# Patient Record
Sex: Female | Born: 1970 | Hispanic: No | Marital: Married | Smoking: Never smoker
Health system: Southern US, Community
[De-identification: ages and names within clinical notes are randomized; demographics above are authoritative.]

## PROBLEM LIST (undated history)

## (undated) ENCOUNTER — Inpatient Hospital Stay (HOSPITAL_COMMUNITY): Payer: Self-pay

## (undated) DIAGNOSIS — E1165 Type 2 diabetes mellitus with hyperglycemia: Secondary | ICD-10-CM

## (undated) DIAGNOSIS — O24419 Gestational diabetes mellitus in pregnancy, unspecified control: Secondary | ICD-10-CM

## (undated) DIAGNOSIS — E669 Obesity, unspecified: Secondary | ICD-10-CM

## (undated) HISTORY — DX: Type 2 diabetes mellitus with hyperglycemia: E11.65

---

## 2007-06-19 ENCOUNTER — Ambulatory Visit (HOSPITAL_COMMUNITY): Admission: RE | Admit: 2007-06-19 | Discharge: 2007-06-19 | Payer: Self-pay | Admitting: Family Medicine

## 2007-11-05 ENCOUNTER — Ambulatory Visit: Payer: Self-pay | Admitting: Family Medicine

## 2007-11-05 ENCOUNTER — Inpatient Hospital Stay (HOSPITAL_COMMUNITY): Admission: AD | Admit: 2007-11-05 | Discharge: 2007-11-06 | Payer: Self-pay | Admitting: Family Medicine

## 2007-11-21 ENCOUNTER — Inpatient Hospital Stay (HOSPITAL_COMMUNITY): Admission: AD | Admit: 2007-11-21 | Discharge: 2007-11-25 | Payer: Self-pay | Admitting: Family Medicine

## 2007-11-21 ENCOUNTER — Ambulatory Visit: Payer: Self-pay | Admitting: Physician Assistant

## 2010-01-27 ENCOUNTER — Ambulatory Visit (HOSPITAL_COMMUNITY): Admission: RE | Admit: 2010-01-27 | Discharge: 2010-01-27 | Payer: Self-pay | Admitting: Family Medicine

## 2010-02-07 ENCOUNTER — Encounter
Admission: RE | Admit: 2010-02-07 | Discharge: 2010-05-03 | Payer: Self-pay | Source: Home / Self Care | Attending: Obstetrics & Gynecology | Admitting: Obstetrics & Gynecology

## 2010-02-07 ENCOUNTER — Ambulatory Visit: Payer: Self-pay | Admitting: Obstetrics & Gynecology

## 2010-02-08 ENCOUNTER — Encounter: Payer: Self-pay | Admitting: Family Medicine

## 2010-02-08 ENCOUNTER — Ambulatory Visit (HOSPITAL_COMMUNITY)
Admission: RE | Admit: 2010-02-08 | Discharge: 2010-02-08 | Payer: Self-pay | Source: Home / Self Care | Admitting: Family Medicine

## 2010-02-14 ENCOUNTER — Ambulatory Visit: Payer: Self-pay | Admitting: Obstetrics and Gynecology

## 2010-02-14 ENCOUNTER — Encounter: Payer: Self-pay | Admitting: Physician Assistant

## 2010-02-21 ENCOUNTER — Ambulatory Visit: Payer: Self-pay | Admitting: Obstetrics & Gynecology

## 2010-03-07 ENCOUNTER — Ambulatory Visit: Payer: Self-pay | Admitting: Obstetrics & Gynecology

## 2010-03-07 LAB — CONVERTED CEMR LAB
HCT: 37.1 % (ref 36.0–46.0)
Hemoglobin: 12 g/dL (ref 12.0–15.0)
MCHC: 32.3 g/dL (ref 30.0–36.0)
MCV: 90 fL (ref 78.0–100.0)
Platelets: 270 10*3/uL (ref 150–400)
RBC: 4.12 M/uL (ref 3.87–5.11)
RDW: 14.6 % (ref 11.5–15.5)
WBC: 10.8 10*3/uL — ABNORMAL HIGH (ref 4.0–10.5)

## 2010-03-21 ENCOUNTER — Ambulatory Visit: Payer: Self-pay | Admitting: Obstetrics & Gynecology

## 2010-04-04 ENCOUNTER — Ambulatory Visit: Payer: Self-pay | Admitting: Obstetrics & Gynecology

## 2010-04-05 ENCOUNTER — Encounter: Payer: Self-pay | Admitting: Obstetrics & Gynecology

## 2010-04-11 ENCOUNTER — Ambulatory Visit
Admission: RE | Admit: 2010-04-11 | Discharge: 2010-04-11 | Payer: Self-pay | Source: Home / Self Care | Attending: Obstetrics and Gynecology | Admitting: Obstetrics and Gynecology

## 2010-04-12 ENCOUNTER — Encounter (INDEPENDENT_AMBULATORY_CARE_PROVIDER_SITE_OTHER): Payer: Self-pay | Admitting: *Deleted

## 2010-04-14 ENCOUNTER — Ambulatory Visit: Admit: 2010-04-14 | Payer: Self-pay | Admitting: Obstetrics and Gynecology

## 2010-04-15 ENCOUNTER — Ambulatory Visit
Admission: RE | Admit: 2010-04-15 | Discharge: 2010-04-15 | Payer: Self-pay | Source: Home / Self Care | Attending: Obstetrics & Gynecology | Admitting: Obstetrics & Gynecology

## 2010-04-18 ENCOUNTER — Ambulatory Visit
Admission: RE | Admit: 2010-04-18 | Discharge: 2010-04-18 | Payer: Self-pay | Source: Home / Self Care | Attending: Obstetrics and Gynecology | Admitting: Obstetrics and Gynecology

## 2010-04-18 ENCOUNTER — Encounter: Payer: Self-pay | Admitting: Obstetrics & Gynecology

## 2010-04-18 LAB — POCT URINALYSIS DIPSTICK
Bilirubin Urine: NEGATIVE
Ketones, ur: 15 mg/dL — AB
Nitrite: NEGATIVE
Protein, ur: NEGATIVE mg/dL
Specific Gravity, Urine: 1.02 (ref 1.005–1.030)
Urine Glucose, Fasting: NEGATIVE mg/dL
Urobilinogen, UA: 0.2 mg/dL (ref 0.0–1.0)
pH: 7 (ref 5.0–8.0)

## 2010-04-20 LAB — POCT URINALYSIS DIPSTICK
Bilirubin Urine: NEGATIVE
Hgb urine dipstick: NEGATIVE
Ketones, ur: NEGATIVE mg/dL
Nitrite: NEGATIVE
Protein, ur: 30 mg/dL — AB
Specific Gravity, Urine: 1.025 (ref 1.005–1.030)
Urine Glucose, Fasting: NEGATIVE mg/dL
Urobilinogen, UA: 0.2 mg/dL (ref 0.0–1.0)
pH: 7 (ref 5.0–8.0)

## 2010-04-21 ENCOUNTER — Ambulatory Visit: Admit: 2010-04-21 | Payer: Self-pay | Admitting: Obstetrics and Gynecology

## 2010-04-21 ENCOUNTER — Ambulatory Visit (HOSPITAL_COMMUNITY)
Admission: RE | Admit: 2010-04-21 | Discharge: 2010-04-21 | Payer: Self-pay | Source: Home / Self Care | Attending: Obstetrics & Gynecology | Admitting: Obstetrics & Gynecology

## 2010-04-22 ENCOUNTER — Ambulatory Visit
Admission: RE | Admit: 2010-04-22 | Discharge: 2010-04-22 | Payer: Self-pay | Source: Home / Self Care | Attending: Obstetrics & Gynecology | Admitting: Obstetrics & Gynecology

## 2010-04-25 ENCOUNTER — Ambulatory Visit
Admission: RE | Admit: 2010-04-25 | Discharge: 2010-04-25 | Payer: Self-pay | Source: Home / Self Care | Attending: Obstetrics & Gynecology | Admitting: Obstetrics & Gynecology

## 2010-04-26 LAB — POCT URINALYSIS DIPSTICK
Bilirubin Urine: NEGATIVE
Hgb urine dipstick: NEGATIVE
Ketones, ur: NEGATIVE mg/dL
Nitrite: NEGATIVE
Protein, ur: 30 mg/dL — AB
Specific Gravity, Urine: 1.02 (ref 1.005–1.030)
Urine Glucose, Fasting: NEGATIVE mg/dL
Urobilinogen, UA: 0.2 mg/dL (ref 0.0–1.0)
pH: 8 (ref 5.0–8.0)

## 2010-04-29 ENCOUNTER — Ambulatory Visit
Admission: RE | Admit: 2010-04-29 | Discharge: 2010-04-29 | Payer: Self-pay | Source: Home / Self Care | Attending: Obstetrics and Gynecology | Admitting: Obstetrics and Gynecology

## 2010-05-02 ENCOUNTER — Ambulatory Visit
Admission: RE | Admit: 2010-05-02 | Discharge: 2010-05-02 | Payer: Self-pay | Source: Home / Self Care | Attending: Obstetrics and Gynecology | Admitting: Obstetrics and Gynecology

## 2010-05-02 ENCOUNTER — Encounter (INDEPENDENT_AMBULATORY_CARE_PROVIDER_SITE_OTHER): Payer: Self-pay | Admitting: *Deleted

## 2010-05-02 LAB — POCT URINALYSIS DIPSTICK
Bilirubin Urine: NEGATIVE
Hgb urine dipstick: NEGATIVE
Ketones, ur: NEGATIVE mg/dL
Nitrite: NEGATIVE
Protein, ur: NEGATIVE mg/dL
Specific Gravity, Urine: 1.02 (ref 1.005–1.030)
Urine Glucose, Fasting: NEGATIVE mg/dL
Urobilinogen, UA: 0.2 mg/dL (ref 0.0–1.0)
pH: 7 (ref 5.0–8.0)

## 2010-05-02 LAB — CONVERTED CEMR LAB
Chlamydia, DNA Probe: NEGATIVE
GC Probe Amp, Genital: NEGATIVE

## 2010-05-03 ENCOUNTER — Encounter (INDEPENDENT_AMBULATORY_CARE_PROVIDER_SITE_OTHER): Payer: Self-pay | Admitting: *Deleted

## 2010-05-05 ENCOUNTER — Other Ambulatory Visit: Payer: Medicaid Other

## 2010-05-05 DIAGNOSIS — O99810 Abnormal glucose complicating pregnancy: Secondary | ICD-10-CM

## 2010-05-05 DIAGNOSIS — O09529 Supervision of elderly multigravida, unspecified trimester: Secondary | ICD-10-CM

## 2010-05-05 DIAGNOSIS — O409XX0 Polyhydramnios, unspecified trimester, not applicable or unspecified: Secondary | ICD-10-CM

## 2010-05-09 ENCOUNTER — Other Ambulatory Visit: Payer: Self-pay | Admitting: Family Medicine

## 2010-05-09 ENCOUNTER — Other Ambulatory Visit: Payer: Self-pay

## 2010-05-09 ENCOUNTER — Other Ambulatory Visit: Payer: Medicaid Other

## 2010-05-09 DIAGNOSIS — O99810 Abnormal glucose complicating pregnancy: Secondary | ICD-10-CM

## 2010-05-09 DIAGNOSIS — O24419 Gestational diabetes mellitus in pregnancy, unspecified control: Secondary | ICD-10-CM

## 2010-05-09 DIAGNOSIS — O409XX0 Polyhydramnios, unspecified trimester, not applicable or unspecified: Secondary | ICD-10-CM

## 2010-05-09 DIAGNOSIS — O09529 Supervision of elderly multigravida, unspecified trimester: Secondary | ICD-10-CM

## 2010-05-09 LAB — POCT URINALYSIS DIPSTICK
Bilirubin Urine: NEGATIVE
Nitrite: NEGATIVE
Protein, ur: NEGATIVE mg/dL
Specific Gravity, Urine: 1.02 (ref 1.005–1.030)
Urine Glucose, Fasting: NEGATIVE mg/dL
Urobilinogen, UA: 0.2 mg/dL (ref 0.0–1.0)
pH: 6.5 (ref 5.0–8.0)

## 2010-05-10 ENCOUNTER — Encounter: Payer: Self-pay | Admitting: Obstetrics & Gynecology

## 2010-05-13 ENCOUNTER — Other Ambulatory Visit: Payer: Medicaid Other

## 2010-05-13 DIAGNOSIS — O99810 Abnormal glucose complicating pregnancy: Secondary | ICD-10-CM

## 2010-05-13 DIAGNOSIS — O409XX0 Polyhydramnios, unspecified trimester, not applicable or unspecified: Secondary | ICD-10-CM

## 2010-05-13 DIAGNOSIS — O09529 Supervision of elderly multigravida, unspecified trimester: Secondary | ICD-10-CM

## 2010-05-16 ENCOUNTER — Other Ambulatory Visit: Payer: Self-pay

## 2010-05-16 ENCOUNTER — Other Ambulatory Visit: Payer: Medicaid Other

## 2010-05-16 DIAGNOSIS — O09529 Supervision of elderly multigravida, unspecified trimester: Secondary | ICD-10-CM

## 2010-05-16 DIAGNOSIS — O99810 Abnormal glucose complicating pregnancy: Secondary | ICD-10-CM

## 2010-05-16 DIAGNOSIS — O409XX0 Polyhydramnios, unspecified trimester, not applicable or unspecified: Secondary | ICD-10-CM

## 2010-05-16 LAB — POCT URINALYSIS DIPSTICK
Bilirubin Urine: NEGATIVE
Hgb urine dipstick: NEGATIVE
Nitrite: NEGATIVE
Protein, ur: NEGATIVE mg/dL
Specific Gravity, Urine: 1.02 (ref 1.005–1.030)
Urine Glucose, Fasting: NEGATIVE mg/dL
Urobilinogen, UA: 0.2 mg/dL (ref 0.0–1.0)
pH: 6.5 (ref 5.0–8.0)

## 2010-05-20 ENCOUNTER — Other Ambulatory Visit: Payer: Medicaid Other

## 2010-05-20 DIAGNOSIS — O409XX0 Polyhydramnios, unspecified trimester, not applicable or unspecified: Secondary | ICD-10-CM

## 2010-05-20 DIAGNOSIS — O99810 Abnormal glucose complicating pregnancy: Secondary | ICD-10-CM

## 2010-05-23 ENCOUNTER — Encounter (HOSPITAL_COMMUNITY): Payer: Self-pay

## 2010-05-23 ENCOUNTER — Ambulatory Visit (HOSPITAL_COMMUNITY)
Admission: RE | Admit: 2010-05-23 | Discharge: 2010-05-23 | Disposition: A | Discharge status: Home / Self Care | Payer: Medicaid Other | Source: Ambulatory Visit | Attending: Family Medicine | Admitting: Family Medicine

## 2010-05-23 ENCOUNTER — Other Ambulatory Visit: Payer: Medicaid Other

## 2010-05-23 ENCOUNTER — Other Ambulatory Visit: Payer: Self-pay

## 2010-05-23 ENCOUNTER — Encounter: Payer: Medicaid Other | Attending: Obstetrics & Gynecology | Admitting: Dietician

## 2010-05-23 ENCOUNTER — Encounter (INDEPENDENT_AMBULATORY_CARE_PROVIDER_SITE_OTHER): Payer: Self-pay | Admitting: *Deleted

## 2010-05-23 DIAGNOSIS — O09529 Supervision of elderly multigravida, unspecified trimester: Secondary | ICD-10-CM | POA: Insufficient documentation

## 2010-05-23 DIAGNOSIS — E669 Obesity, unspecified: Secondary | ICD-10-CM | POA: Insufficient documentation

## 2010-05-23 DIAGNOSIS — O99210 Obesity complicating pregnancy, unspecified trimester: Secondary | ICD-10-CM | POA: Insufficient documentation

## 2010-05-23 DIAGNOSIS — O24419 Gestational diabetes mellitus in pregnancy, unspecified control: Secondary | ICD-10-CM

## 2010-05-23 DIAGNOSIS — O30009 Twin pregnancy, unspecified number of placenta and unspecified number of amniotic sacs, unspecified trimester: Secondary | ICD-10-CM

## 2010-05-23 DIAGNOSIS — O99810 Abnormal glucose complicating pregnancy: Secondary | ICD-10-CM

## 2010-05-23 DIAGNOSIS — O0930 Supervision of pregnancy with insufficient antenatal care, unspecified trimester: Secondary | ICD-10-CM

## 2010-05-23 LAB — POCT URINALYSIS DIPSTICK
Bilirubin Urine: NEGATIVE
Ketones, ur: NEGATIVE mg/dL
Nitrite: NEGATIVE
Protein, ur: NEGATIVE mg/dL
Specific Gravity, Urine: 1.02 (ref 1.005–1.030)
Urine Glucose, Fasting: NEGATIVE mg/dL
Urobilinogen, UA: 0.2 mg/dL (ref 0.0–1.0)
pH: 5.5 (ref 5.0–8.0)

## 2010-05-25 ENCOUNTER — Inpatient Hospital Stay (HOSPITAL_COMMUNITY): Payer: Medicaid Other

## 2010-05-25 ENCOUNTER — Inpatient Hospital Stay (HOSPITAL_COMMUNITY)
Admission: RE | Admit: 2010-05-25 | Discharge: 2010-05-28 | DRG: 765 | Disposition: A | Discharge status: Home / Self Care | Payer: Medicaid Other | Source: Ambulatory Visit | Attending: Obstetrics & Gynecology | Admitting: Obstetrics & Gynecology

## 2010-05-25 DIAGNOSIS — O409XX0 Polyhydramnios, unspecified trimester, not applicable or unspecified: Secondary | ICD-10-CM | POA: Diagnosis present

## 2010-05-25 DIAGNOSIS — O3660X0 Maternal care for excessive fetal growth, unspecified trimester, not applicable or unspecified: Secondary | ICD-10-CM

## 2010-05-25 DIAGNOSIS — O329XX0 Maternal care for malpresentation of fetus, unspecified, not applicable or unspecified: Secondary | ICD-10-CM | POA: Diagnosis present

## 2010-05-25 DIAGNOSIS — E669 Obesity, unspecified: Secondary | ICD-10-CM | POA: Diagnosis present

## 2010-05-25 DIAGNOSIS — O99892 Other specified diseases and conditions complicating childbirth: Secondary | ICD-10-CM | POA: Diagnosis present

## 2010-05-25 DIAGNOSIS — O99214 Obesity complicating childbirth: Secondary | ICD-10-CM | POA: Diagnosis present

## 2010-05-25 DIAGNOSIS — O99814 Abnormal glucose complicating childbirth: Secondary | ICD-10-CM

## 2010-05-25 DIAGNOSIS — Z22330 Carrier of Group B streptococcus: Secondary | ICD-10-CM

## 2010-05-25 DIAGNOSIS — O09529 Supervision of elderly multigravida, unspecified trimester: Secondary | ICD-10-CM | POA: Diagnosis present

## 2010-05-25 LAB — CBC
HCT: 38.1 % (ref 36.0–46.0)
Hemoglobin: 12.4 g/dL (ref 12.0–15.0)
MCH: 28.5 pg (ref 26.0–34.0)
MCHC: 32.5 g/dL (ref 30.0–36.0)
MCV: 87.6 fL (ref 78.0–100.0)
Platelets: 274 10*3/uL (ref 150–400)
RBC: 4.35 MIL/uL (ref 3.87–5.11)
RDW: 15 % (ref 11.5–15.5)
WBC: 9.4 10*3/uL (ref 4.0–10.5)

## 2010-05-25 LAB — GLUCOSE, CAPILLARY: Glucose-Capillary: 91 mg/dL (ref 70–99)

## 2010-05-25 LAB — GLUCOSE, RANDOM: Glucose, Bld: 80 mg/dL (ref 70–99)

## 2010-05-25 LAB — MRSA PCR SCREENING: MRSA by PCR: NEGATIVE

## 2010-05-25 LAB — RPR: RPR Ser Ql: NONREACTIVE

## 2010-05-26 LAB — CBC
HCT: 33.2 % — ABNORMAL LOW (ref 36.0–46.0)
Hemoglobin: 10.5 g/dL — ABNORMAL LOW (ref 12.0–15.0)
MCH: 27.8 pg (ref 26.0–34.0)
MCHC: 31.6 g/dL (ref 30.0–36.0)
MCV: 87.8 fL (ref 78.0–100.0)
Platelets: 229 10*3/uL (ref 150–400)
RBC: 3.78 MIL/uL — ABNORMAL LOW (ref 3.87–5.11)
RDW: 15.1 % (ref 11.5–15.5)
WBC: 8.8 10*3/uL (ref 4.0–10.5)

## 2010-05-26 LAB — GLUCOSE, CAPILLARY
Glucose-Capillary: 114 mg/dL — ABNORMAL HIGH (ref 70–99)
Glucose-Capillary: 127 mg/dL — ABNORMAL HIGH (ref 70–99)
Glucose-Capillary: 102 mg/dL — ABNORMAL HIGH (ref 70–99)

## 2010-05-26 NOTE — Op Note (Addendum)
Norma Chambers, Norma Chambers                ACCOUNT NO.:  [id number]  MEDICAL RECORD NO.:  [id number]           PATIENT TYPE:  I  LOCATION:  9109                          [facility]  PHYSICIAN:  Scheryl Darter, MD       DATE OF BIRTH:  10-Jul-1970  DATE OF PROCEDURE:  05/25/2010 DATE OF DISCHARGE:                              OPERATIVE REPORT   PREOPERATIVE DIAGNOSES: 1. Intrauterine pregnancy at 39 and 0 weeks. 2. Fetal macrosomia. 3. A2 gestational diabetes. 4. Fetal malpresentation.  POSTOPERATIVE DIAGNOSIS: 1. Intrauterine pregnancy at 39 and 0 weeks. 2. Fetal macrosomia. 3. A2 gestational diabetes. 4. Fetal malpresentation.  PROCEDURE:  Primary low-transverse cesarean section via Pfannenstiel incision.  SURGEONS:  Scheryl Darter, MD and Maryelizabeth Kaufmann, MD  ANESTHESIA:  Spinal.  IV FLUIDS:  2700 mL  URINE OUTPUT:  200 mL of clear urine at the end of the procedure.  ESTIMATED BLOOD LOSS:  800 mL.  FINDINGS:  A viable female infant in vertex presentation with clear amniotic fluid.  Apgars were 8 and 9, weight was 11 pounds 4 ounces. Normal uterus, tubes, and ovaries.  SPECIMENS:  Placenta was sent to Labor and Delivery.  COMPLICATIONS:  None immediate.  INDICATIONS:  This is a 40 year old gravida 2, para 1-0-0-1 with intrauterine pregnancy at 39 weeks and 0 days with A2 gestational diabetes, history of previous vacuum-assisted vaginal delivery of a 9 pounds 5 ounces baby that spent time in the NICU.  However, the prenatal history was notable for advanced maternal age, late care, polyhydramnios, and morbid obesity.  The patient presented on day of admission for an induction of labor for gestational diabetes and polyhydramnios.  She was otherwise asymptomatic except for some nasal congestion and non laboring. Her medications included glyburide 2.5 mg p.o. at bedtime. The remainder of her past medical history was unremarkable.  She did have an ultrasound on the May 23, 2010, and had an estimated fetal weight of 12 pounds 13 ounces and then she had another ultrasound today with transverse presentation with the shoulder.  Her digital exam was deferred.   The patient was counseled regarding fetal macrosomia in transverse presentation, and the patient was consented for primary cesarean section due to fetal macrosomia and transverse presentation. Risks, benefits, and alternatives were discussed with the patient, and it was decided that the patient would proceed back with cesarean section for the aforementioned reasons.  PROCEDURE IN DETAIL:  After informed consent was obtained, the patient was taken back to the operative suite where spinal anesthesia was placed.  After anesthesia was found to be adequate, the patient was placed in dorsal supine position with a leftward tilt.  The patient was prepped and draped in normal sterile fashion.  Again, anesthesia was tested, was found to be adequate.  A Pfannenstiel skin incision was then made with the scalpel and carried down through to underlying fascia. The fascia was then incised in the midline.  The fascial incision was then extended laterally with Mayo scissors.  The superior aspect of the fascial incision was then grasped with Kocher clamps then elevated and tented up, and  the rectus muscles was then dissected off bluntly. Attention was then turned inferiorly where the inferior aspect of the fascial incision and then grasped with Kocher clamps, elevated, tented up, and the rectus muscles were then dissected off bluntly, and the rectus muscles was then separated in the midline.  Peritoneum was then entered bluntly, and the Alexis O ring retractor was then inserted into the abdomen.  The vesicouterine peritoneum was then identified, and it was incised with a Metzenbaum scissors and a bladder flap was then created digitally.  The lower uterine segment was then incised in a transverse fashion with a scalpel.   The incision was then extended manually.  Amniotomy was performed with copious clear fluid due to polyhydramnios and infant was found to be now in vertex presentation, and was delivered otherwise atraumatically.  Mouth and nose were bulb suctioned. Cord was cut and clamped and was sent off to awaiting NICU.  Apgars were 8 and 9, weight was 11 pounds 4 ounces.  Cord blood was sampled. Placenta was then delivered spontaneously intact with a three-vessel cord.  The uterus was then cleared of all clots and debris.  The incision was then repaired with a two-layer closure with an 0 Vicryl initially running and locking, and then the second layer was then imbricated.  Hemostasis was found to be adequate.  Bilateral adnexa were then visualized and found to be normal.  The Alexis O ring retractor was then removed.  Peritoneum was then cleared of all clots and debris.  The peritoneum was then closed with 2-0 Vicryl in a running fashion.  The fascial incision was then repaired with an 0 Vicryl in a running fashion.  Subcutaneous tissue was then irrigated, found to be hemostatic.  Additional hemostasis was then obtained with Bovie, and the skin was then closed with staples.  The patient did receive antibiotics perioperatively.  Lap, needle, and sponge counts were correct x2.  The patient was taken back to the recovery area in stable condition.  There were no complications.    ______________________________ Maryelizabeth Kaufmann, MD   ______________________________ Scheryl Darter, MD    LC/MEDQ  D:  05/25/2010  T:  05/26/2010  Job:  [id number]  Electronically Signed by Maryelizabeth Kaufmann MD on 05/26/2010 09:38:13 AM Electronically Signed by Scheryl Darter MD on 05/27/2010 10:43:58 AM

## 2010-05-27 LAB — GLUCOSE, CAPILLARY: Glucose-Capillary: 104 mg/dL — ABNORMAL HIGH (ref 70–99)

## 2010-06-06 NOTE — Discharge Summary (Addendum)
Norma Chambers, Norma Chambers                ACCOUNT NO.:  [id number]  MEDICAL RECORD NO.:  [id number]           PATIENT TYPE:  I  LOCATION:  9110                          [facility]  PHYSICIAN:  Lesly Dukes, M.D. DATE OF BIRTH:  20-Aug-1970  DATE OF ADMISSION:  05/25/2010 DATE OF DISCHARGE:  05/27/2010                              DISCHARGE SUMMARY   ADMISSION DIAGNOSES: 1. Intrauterine pregnancy at 39 weeks and 0 days. 2. A2 gestational diabetes. 3. Fetal macrosomia. 4. Transverse presentation.  DISCHARGE DIAGNOSES:  Status post primary low transverse cesarean section for fetal macrosomia and transverse presentation.  DISCHARGE MEDICATIONS: 1. Ibuprofen 600 mg one tablet p.o. q.6 h. p.r.n. 2. Percocet 5/325 one tablet p.o. q.4 h. p.r.n. 3. Colace 100 mg one tablet p.o. b.i.d. 4. Claritin 10 mg one tablet p.o. daily. 5. Mucinex 600 mg one tablet p.o. q.12 h. as needed.  PERTINENT LABORATORY DATA:  Postoperative CBC with a hemoglobin of 10.5, hematocrit of 33.2, white count was 8.8, and platelets were 229, CBG during this admission ranged from 104 to maximum of 127.  She was not continued on her home glyburide postoperatively.  OPERATIONS:  She had a primary low transverse cesarean section on May 25, 2010, by Dr. Debroah Loop and Dr. Orvan Falconer with a delivery of a viable female in vertex presentation at that time, clear amniotic fluid. Apgars were 8 and 9, weight was 11 pounds and 4 ounces.  No complications.  Normal uterus, tubes, and ovaries.  HOSPITAL COURSE:  This is a 40 year old gravida 2, para 1 who was in on the day of admission with a history of intrauterine pregnancy at 39 weeks and 0 days with prenatal history complicated by A2 gestational diabetes, advanced maternal age, polyhydramnios, morbid obesity, and late care.  The patient was admitted for induction.  Her estimated fetal weight was 12 pounds 13 ounces on ultrasound 2 days previously and ultrasound on the day  of admission, had transverse presentation with the shoulder.  Decision was made at that time to proceed with a primary low transverse cesarean section for fetal macrosomia.  Result of this surgery are as previously mentioned.  Postoperative course was otherwise benign.  She was doing well.  She is asymptomatic, ambulating, hemodynamically stable.  She was discharged home on postoperative day 2 in stable condition with no complaints.  DISPOSITION:  Discharged to home.  DISCHARGE CONDITION:  Stable.  FOLLOWUP:  The patient is to follow up in [facility] in 4-5 weeks for postoperative check.  She will have her staples to be removed by the [facility] nurse on postoperative day 5-7.  The patient will also [facility] for IUD placement.  EMERGENCY ROOM WARNINGS:  The patient is to return to the emergency department with any fevers, chills, nausea, vomiting, any problems with her incision such as redness, swelling, discharge, pain, bleeding, opening, or any other problems.    ______________________________ Maryelizabeth Kaufmann, MD   ______________________________ Lesly Dukes, M.D.    LC/MEDQ  D:  05/27/2010  T:  05/27/2010  Job:  [id number]  Electronically  Signed by Maryelizabeth Kaufmann MD on 06/06/2010 09:48:16 AM Electronically Signed by Elsie Lincoln M.D. on 07/15/2010 11:18:50 AM

## 2010-06-13 LAB — POCT URINALYSIS DIPSTICK
Bilirubin Urine: NEGATIVE
Glucose, UA: NEGATIVE mg/dL
Hgb urine dipstick: NEGATIVE
Hgb urine dipstick: NEGATIVE
Ketones, ur: 15 mg/dL — AB
Protein, ur: NEGATIVE mg/dL
Specific Gravity, Urine: 1.025 (ref 1.005–1.030)
Urobilinogen, UA: 0.2 mg/dL (ref 0.0–1.0)
pH: 6.5 (ref 5.0–8.0)
pH: 7 (ref 5.0–8.0)

## 2010-06-14 LAB — POCT URINALYSIS DIPSTICK
Hgb urine dipstick: NEGATIVE
Hgb urine dipstick: NEGATIVE
Ketones, ur: NEGATIVE mg/dL
Ketones, ur: NEGATIVE mg/dL
Nitrite: NEGATIVE
Nitrite: NEGATIVE
Protein, ur: 30 mg/dL — AB
Protein, ur: NEGATIVE mg/dL
Protein, ur: NEGATIVE mg/dL
Protein, ur: NEGATIVE mg/dL
Specific Gravity, Urine: 1.025 (ref 1.005–1.030)
Specific Gravity, Urine: 1.025 (ref 1.005–1.030)
Urobilinogen, UA: 0.2 mg/dL (ref 0.0–1.0)
Urobilinogen, UA: 0.2 mg/dL (ref 0.0–1.0)
pH: 6.5 (ref 5.0–8.0)
pH: 7 (ref 5.0–8.0)
pH: 7 (ref 5.0–8.0)
pH: 7 (ref 5.0–8.0)

## 2010-06-20 ENCOUNTER — Ambulatory Visit: Payer: Medicaid Other | Admitting: Obstetrics & Gynecology

## 2010-06-23 ENCOUNTER — Ambulatory Visit (INDEPENDENT_AMBULATORY_CARE_PROVIDER_SITE_OTHER): Payer: Medicaid Other | Admitting: Obstetrics & Gynecology

## 2010-06-23 DIAGNOSIS — Z09 Encounter for follow-up examination after completed treatment for conditions other than malignant neoplasm: Secondary | ICD-10-CM

## 2010-06-24 NOTE — Progress Notes (Signed)
NAMEJERICHO, Norma Chambers                ACCOUNT NO.:  [id number]  MEDICAL RECORD NO.:  [id number]           PATIENT TYPE:  A  LOCATION:  [facility]                   [facility]  PHYSICIAN:  Jaynie Collins, MD     DATE OF BIRTH:  1970/06/04  DATE OF SERVICE:  06/23/2010                                 CLINIC NOTE  This patient is a 40 year old who is returning for her 4-week postop check status post C-section delivery for a 11-pound 4-ounce baby or 5105 g.  The patient's pregnancy was complicated by GDM, she was on glyburide.  She also has polyhydramnios and was rubella nonimmune.  She was presenting to care, late at 22 weeks.  The patient presents today without any complaint of pain.  She states that her incisions are healing well.  She has not had any abdominal pain, trouble with bowel movement.  She denies any lochia.  She has not had sex since her delivery.  This patient reports that she is breastfeeding, and on prenatal vitamins today.  For contraception, the patient decided the ParaGard.  We discussed that she would need [facility] for [facility] placement.  REVIEW OF SYSTEMS:  The patient denies headaches, chest pain, abdominal pain, changes in her bowel movement.  PHYSICAL EXAMINATION:  VITAL SIGNS:  Temperature 98.2, pulse 84, blood pressure 114/80, weight 216.8, height 64 inches. GENERAL:  She is in no acute distress.  Vital signs were reviewed. ABDOMEN:  Soft, nondistended, nontender.  C-section well healed. GYNECOLOGIC:  External genitalia is normal without lesions.  Vagina is normal with normal rugation and color without discharge.  Cervix is normal without friability, cervical motion tenderness, or discharge. Uterus is slightly below the umbilicus and ovaries are not palpated due to body habitus.  ALLERGIES:  The patient has no known drug allergies.  ASSESSMENT AND PLAN:  This is a 40 year old 4-week  postop from a C- section.  PLAN: 1. The  patient needs to go to health department for ParaGard     insertion. 2. The patient is to return to clinic in 2 weeks for 6-week postpartum     check and for a 2-hour glucose tolerance test as she has had GDM.    ______________________________ Ellery Plunk, MD   ______________________________ Jaynie Collins, MD  RS/MEDQ  D:  06/23/2010  T:  06/24/2010  Job:  [id number]

## 2010-08-19 NOTE — Discharge Summary (Signed)
[name], Chambers                ACCOUNT NO.:  [id number]   MEDICAL RECORD NO.:  [id number]          PATIENT TYPE:  INP   LOCATION:  9125                          [facility]   PHYSICIAN:  Ginger Carne, MD  DATE OF BIRTH:  March 27, 1971   DATE OF ADMISSION:  11/21/2007  DATE OF DISCHARGE:  11/25/2007                               DISCHARGE SUMMARY   REASON FOR ADMISSION:  Onset of labor.   PROCEDURE:  1. Prenatal: nonstress test, ultrasound.  2. Intrapartum: vacuum-assisted vaginal delivery.   OPERATIVE POSTPARTUM:  None.   DISCHARGE DIAGNOSIS:  Term pregnancy delivered.   DISCHARGE DATE:  November 25, 2007.   ACTIVITY:  Unrestricted.   DIET:  Routine.   MEDICATIONS:  1. Prenatal vitamins p.o. 1 tablet daily while nursing.  2. Colace 100 mg p.o. once daily as needed for postpartum      constipation.  3. Ibuprofen 600 mg p.o. 1-2 tablet q.4 hour as needed for postpartum      pain.  4. Micronor progesterone birth control p.o. 1 daily at the same time      each day.   STATUS:  Well.   INSTRUCTIONS:  Pelvic rest x6 weeks.  Discharge to home.  Followup in 6  weeks at the [facility] Department.   LABS:  November 21, 2007:  Hematocrit 37.2, hemoglobin 12.4   NEWBORN DATA:  The patient delivered a viable female with a weight of 9  pounds 5 ounces.  Newborn was discharged home to mother.   BRIEF HOSPITAL COURSE:  This is a 40 year old G1 who presents at 106.4  weeks' gestational age with spontaneous rupture of membrane at home.  At  admission, the patient had positive fern test, positive Nitrazine test,  and positive pooling.  The patient was admitted to L&D and after  prolonged labor, the patient delivered viable female via vacuum-assisted  vaginal delivery with Apgars of 4, 6, and 9.  Baby and mother did well  during hospitalization.  Patient hemodynamically stabled.  The patient  is breastfeeding.  Desires birth control pills for  contraception.  The  patient discharged home with prenatal vitamin,  Micronor, Colace, and ibuprofen.  The patient is to follow up in 6 weeks  at the [facility] Department for postpartum check.  The  patient with GBS negative.  Blood type 0 positive.  Antibody negative.  Rubella immune.      Angeline Slim, MD  Electronically Signed     ______________________________  Ginger Carne, MD    CT/MEDQ  D:  12/18/2007  T:  12/19/2007  Job:  [id number]

## 2010-12-19 ENCOUNTER — Ambulatory Visit: Payer: Medicaid Other

## 2010-12-30 LAB — URINE MICROSCOPIC-ADD ON

## 2010-12-30 LAB — URINALYSIS, ROUTINE W REFLEX MICROSCOPIC
Bilirubin Urine: NEGATIVE
Ketones, ur: NEGATIVE
Nitrite: NEGATIVE
pH: 6

## 2010-12-30 LAB — WET PREP, GENITAL

## 2011-01-17 ENCOUNTER — Encounter (HOSPITAL_COMMUNITY): Payer: Self-pay | Admitting: *Deleted

## 2011-10-28 ENCOUNTER — Encounter (HOSPITAL_COMMUNITY): Payer: Self-pay | Admitting: *Deleted

## 2011-10-28 ENCOUNTER — Emergency Department (HOSPITAL_COMMUNITY)
Admission: EM | Admit: 2011-10-28 | Discharge: 2011-10-28 | Disposition: A | Discharge status: Home / Self Care | Payer: Self-pay | Source: Home / Self Care | Attending: Emergency Medicine | Admitting: Emergency Medicine

## 2011-10-28 DIAGNOSIS — H60399 Other infective otitis externa, unspecified ear: Secondary | ICD-10-CM

## 2011-10-28 DIAGNOSIS — B351 Tinea unguium: Secondary | ICD-10-CM

## 2011-10-28 DIAGNOSIS — H6092 Unspecified otitis externa, left ear: Secondary | ICD-10-CM

## 2011-10-28 HISTORY — DX: Gestational diabetes mellitus in pregnancy, unspecified control: O24.419

## 2011-10-28 MED ORDER — NEOMYCIN-POLYMYXIN-HC 3.5-10000-1 OT SUSP: 4.0000 [drp] | Freq: Three times a day (TID) | OTIC | Status: AC

## 2011-10-28 NOTE — ED Provider Notes (Signed)
History     CSN: [id number]  Arrival date & time 10/28/11  1132   First MD Initiated Contact with Patient 10/28/11 1232      Chief Complaint  Patient presents with  . Otalgia    (Consider location/radiation/quality/duration/timing/severity/associated sxs/prior treatment) HPI Comments: Pt also c/o of problem with L fingernails for 1 month.  Has been using cortisone cream on it for no change in sx.   Patient is a 41 y.o. female presenting with ear pain. The history is provided by the patient and the spouse. The history is limited by a language barrier. A language interpreter was used (spouse translated at times).  Otalgia This is a new problem. The current episode started 2 days ago. There is pain in the left ear. The problem occurs constantly. The problem has been gradually worsening. There has been no fever. Pertinent negatives include no ear discharge, no headaches, no rhinorrhea, no sore throat, no cough and no rash.    Past Medical History  Diagnosis Date  . Gestational diabetes     Past Surgical History  Procedure Date  . Cesarean section     History reviewed. No pertinent family history.  History  Substance Use Topics  . Smoking status: Never Smoker   . Smokeless tobacco: Not on file  . Alcohol Use: No    OB History    Grav Para Term Preterm Abortions TAB SAB Ect Mult Living   1 1 1              Review of Systems  Constitutional: Negative for fever and chills.  HENT: Positive for ear pain. Negative for congestion, sore throat, rhinorrhea and ear discharge.   Respiratory: Negative for cough.   Skin: Negative for rash.       Fingernail problem  Neurological: Negative for headaches.    Allergies  Review of patient's allergies indicates no known allergies.  Home Medications   Current Outpatient Rx  Name Route Sig Dispense Refill  . NEOMYCIN-POLYMYXIN-HC 3.5-10000-1 OT SUSP Left Ear Place 4 drops into the left ear 3 (three) times daily. For 10 days 10 mL  0    BP 157/93  Pulse 90  Temp 98.6 F (37 C) (Oral)  Resp 20  SpO2 99%  LMP 10/15/2011  Breastfeeding? No  Physical Exam  Constitutional: She appears well-developed and well-nourished. No distress.  HENT:  Right Ear: Tympanic membrane, external ear and ear canal normal.  Left Ear: Tympanic membrane normal.       L ear with tender to palp tragus and on traction for otoscope exam.  Canal narrowed due to edema and exudate, TM visualized and intact, no sign of infection in TM  Pulmonary/Chest: Effort normal.  Skin: Skin is warm and dry.       L 3rd and 4th fingernails discolored, thin, irregular, c/w onychomycosis     ED Course  Procedures (including critical care time)  Labs Reviewed - No data to display No results found.   1. Otitis externa of left ear   2. Onychomycosis       MDM  Discussed usual tx for onychomycosis with pt and husband, and how she will need labs monitored during tx.  Recommended they talk with [facility] about tx for this.         Cathlyn Parsons, NP 10/28/11 1239

## 2011-10-28 NOTE — ED Notes (Signed)
C/O left earache x 1.5 days without fever, congestion, runny nose.  Has been taking Advil for pain.

## 2011-10-29 NOTE — ED Provider Notes (Signed)
Medical screening examination/treatment/procedure(s) were performed by non-physician practitioner and as supervising physician I was immediately available for consultation/collaboration.  Leslee Home, M.D.   Reuben Likes, MD 10/29/11 1059

## 2012-06-04 LAB — VARICELLA ZOSTER ANTIBODY, IGG: VZ Immune Status Interp: IMMUNE

## 2012-07-05 ENCOUNTER — Inpatient Hospital Stay (HOSPITAL_COMMUNITY)
Admission: AD | Admit: 2012-07-05 | Discharge: 2012-07-05 | Discharge status: Against Medical Advice | Payer: Medicaid Other | Source: Ambulatory Visit | Attending: Obstetrics and Gynecology | Admitting: Obstetrics and Gynecology

## 2012-07-05 ENCOUNTER — Encounter (HOSPITAL_COMMUNITY): Payer: Self-pay

## 2012-07-05 DIAGNOSIS — M79609 Pain in unspecified limb: Secondary | ICD-10-CM | POA: Insufficient documentation

## 2012-07-05 DIAGNOSIS — O99891 Other specified diseases and conditions complicating pregnancy: Secondary | ICD-10-CM | POA: Insufficient documentation

## 2012-07-05 DIAGNOSIS — N949 Unspecified condition associated with female genital organs and menstrual cycle: Secondary | ICD-10-CM | POA: Insufficient documentation

## 2012-07-05 NOTE — MAU Provider Note (Signed)
  History     CSN: [id number]  Arrival date and time: 07/05/12 1029   None     Chief Complaint  Patient presents with  . Leg Pain   HPI Norma Chambers is 42 y.o. G3P2002 [redacted]w[redacted]d weeks presenting with pain in her vagina pain over the last 3 days, most noticeable when moving, pressing the gas/brake when driving.  Has not begun prenatal care because the [facility] is "full" per husband's report.  Was seen in our High Risk clinic with previous pregnancy for gestational diabetes. Denies  UTI, vaginal bleeding or discharge.  The husband recently had surgery for a fractured patella and is unable to drive.  He states they have to pick up a child at 2 and would have to leave in time to get her.  I explained I would not be able to complete the evaluation before that time and suggested they return after picking her up.  In the meantime, I will get information re: appt in clinic for them.  She is stable and is uncomfortable only when moving.     Past Medical History  Diagnosis Date  . Gestational diabetes     Past Surgical History  Procedure Laterality Date  . Cesarean section      History reviewed. No pertinent family history.  History  Substance Use Topics  . Smoking status: Never Smoker   . Smokeless tobacco: Not on file  . Alcohol Use: No    Allergies: No Known Allergies  No prescriptions prior to admission    ROS Physical Exam   Blood pressure 124/81, pulse 105, temperature 98.4 F (36.9 C), temperature source Oral, resp. rate 20, height 5' 3.5" (1.613 m), weight 296 lb 6.4 oz (134.446 kg), last menstrual period 02/19/2012, SpO2 100.00%.  Physical Exam  MAU Course  Procedures  MDM Patient left AMA to pick their daughter up from school.  At the end of my shift, the patient had not returned to MAU for completion of the evaluation.  Should the patient come back, I was planning to refer her to the [facility] at [facility] for prenatal care and schedule an out patient ultrasound for 1  week.  Assessment and Plan    KEY,EVE M 07/05/2012, 1:17 PM

## 2012-07-05 NOTE — Progress Notes (Signed)
Pt & her husband refuse interpreter, states he is interpreting for her.  Informed them of policy, states they want anything else to delay them, they have a daughter to pick up at school.

## 2012-07-05 NOTE — MAU Note (Signed)
Pt stated she had to leave to pick up a small child and states she will return after they pick the child up.  FOB is not able to drive since he recently had some surgery.  Pt had to drive vehicle to pick child up.

## 2012-07-05 NOTE — MAU Note (Signed)
Pt states she has had "very strong" vaginal pain when she is driving or walking.  Denies bleeding or discharge.  Also denies dysuria.

## 2012-07-05 NOTE — MAU Note (Signed)
Patient states she has not had prenatal care pending Medicaid. States she has pain in the back of her right leg when working or driving. Denies bleeding, abdominal pain. States she has not felt fetal movement yet.

## 2012-07-06 NOTE — MAU Provider Note (Signed)
Attestation of Attending Supervision of Advanced Practitioner (CNM/NP): Evaluation and management procedures were performed by the Advanced Practitioner under my supervision and collaboration.  I have reviewed the Advanced Practitioner's note and chart, and I agree with the management and plan.  Braylon Lemmons 07/06/2012 10:54 PM

## 2012-08-05 ENCOUNTER — Other Ambulatory Visit (HOSPITAL_COMMUNITY): Payer: Self-pay | Admitting: Physician Assistant

## 2012-08-05 DIAGNOSIS — Z0489 Encounter for examination and observation for other specified reasons: Secondary | ICD-10-CM

## 2012-08-05 LAB — GC/CHLAMYDIA PROBE AMP
Chlamydia Probe Amp: NEGATIVE
GC Probe Amp, Genital: NEGATIVE

## 2012-08-05 LAB — OB RESULTS CONSOLE HGB/HCT, BLOOD: Hemoglobin: 11.9 g/dL

## 2012-08-05 LAB — OB RESULTS CONSOLE ABO/RH: RH Type: POSITIVE

## 2012-08-07 ENCOUNTER — Ambulatory Visit (HOSPITAL_COMMUNITY)
Admission: RE | Admit: 2012-08-07 | Discharge: 2012-08-07 | Disposition: A | Discharge status: Home / Self Care | Payer: Medicaid Other | Source: Ambulatory Visit | Attending: Physician Assistant | Admitting: Physician Assistant

## 2012-08-07 DIAGNOSIS — O09299 Supervision of pregnancy with other poor reproductive or obstetric history, unspecified trimester: Secondary | ICD-10-CM | POA: Insufficient documentation

## 2012-08-07 DIAGNOSIS — E669 Obesity, unspecified: Secondary | ICD-10-CM | POA: Insufficient documentation

## 2012-08-07 DIAGNOSIS — Z1389 Encounter for screening for other disorder: Secondary | ICD-10-CM | POA: Insufficient documentation

## 2012-08-07 DIAGNOSIS — O358XX0 Maternal care for other (suspected) fetal abnormality and damage, not applicable or unspecified: Secondary | ICD-10-CM | POA: Insufficient documentation

## 2012-08-07 DIAGNOSIS — Z0489 Encounter for examination and observation for other specified reasons: Secondary | ICD-10-CM

## 2012-08-07 DIAGNOSIS — Z363 Encounter for antenatal screening for malformations: Secondary | ICD-10-CM | POA: Insufficient documentation

## 2012-08-08 LAB — GLUCOSE, FASTING: Glucose, Fasting: 110 mg/dL — AB (ref 60–109)

## 2012-08-08 LAB — GLUCOSE TOLERANCE, 1 HOUR: GTT, 1 hr: 189

## 2012-08-12 ENCOUNTER — Encounter: Payer: Medicaid Other | Attending: Family | Admitting: Dietician

## 2012-08-12 ENCOUNTER — Ambulatory Visit (INDEPENDENT_AMBULATORY_CARE_PROVIDER_SITE_OTHER): Payer: Medicaid Other | Admitting: Family Medicine

## 2012-08-12 DIAGNOSIS — O99810 Abnormal glucose complicating pregnancy: Secondary | ICD-10-CM | POA: Insufficient documentation

## 2012-08-12 DIAGNOSIS — Z713 Dietary counseling and surveillance: Secondary | ICD-10-CM | POA: Insufficient documentation

## 2012-08-12 NOTE — Progress Notes (Signed)
Diabetes Education:  Seen today for diabetes education and will cover the diet since the RD is not due in clinic until 9:00 AM.  Has a history of GDM with her last pregnancy of 2 years ago.  Has a positive family history for type 2 diabetes.  HT: 64 in  WT: 196.3 lb.  Completed review of the physiology of GDM and the dietary, exercise recommendations.  Review the need for 30 minutes of daily walking in the cooler part of the day.  Review of hypoglycemia and its treatment. Review of the diet, carb counting, and exchanges.  Provided handout in Arabic "Nutrition, Diabetes and Pregnancy", provided an [place] handout for carb counting.  Provided an Accu-Chek meter Kit Lot: [id number] Expiration 07/31/2013 with instruction.  Instructed to check fasting glucose level and 2 hours after the first bite of each meal.  Today her fasting level was at 107 mg/dl.  Instructed to record all glucose readings and to bring her meter and glucose log to all clinic appointments.  Maggie Nayden Czajka, RN, RD, CDE

## 2012-08-14 ENCOUNTER — Other Ambulatory Visit: Payer: Self-pay | Admitting: Obstetrics and Gynecology

## 2012-08-14 ENCOUNTER — Telehealth: Payer: Self-pay

## 2012-08-14 DIAGNOSIS — O24419 Gestational diabetes mellitus in pregnancy, unspecified control: Secondary | ICD-10-CM

## 2012-08-14 MED ORDER — GLUCOSE BLOOD VI STRP: ORAL_STRIP | Status: DC

## 2012-08-14 NOTE — Telephone Encounter (Signed)
Patients husband called and left a message.  I called the patient back and she asked that we call her husband because she does not speak much english.  When I called her husband I received his voicemail and left a message for him to call us back.

## 2012-08-19 ENCOUNTER — Encounter: Payer: Self-pay | Admitting: Obstetrics and Gynecology

## 2012-08-19 ENCOUNTER — Ambulatory Visit (INDEPENDENT_AMBULATORY_CARE_PROVIDER_SITE_OTHER): Payer: Medicaid Other | Admitting: Obstetrics and Gynecology

## 2012-08-19 VITALS — BP 125/81 | Temp 98.7°F | Wt 298.0 lb

## 2012-08-19 DIAGNOSIS — O0932 Supervision of pregnancy with insufficient antenatal care, second trimester: Secondary | ICD-10-CM

## 2012-08-19 DIAGNOSIS — O09529 Supervision of elderly multigravida, unspecified trimester: Secondary | ICD-10-CM | POA: Insufficient documentation

## 2012-08-19 DIAGNOSIS — O99810 Abnormal glucose complicating pregnancy: Secondary | ICD-10-CM | POA: Insufficient documentation

## 2012-08-19 DIAGNOSIS — O09522 Supervision of elderly multigravida, second trimester: Secondary | ICD-10-CM

## 2012-08-19 DIAGNOSIS — E669 Obesity, unspecified: Secondary | ICD-10-CM

## 2012-08-19 DIAGNOSIS — O09299 Supervision of pregnancy with other poor reproductive or obstetric history, unspecified trimester: Secondary | ICD-10-CM

## 2012-08-19 DIAGNOSIS — O0930 Supervision of pregnancy with insufficient antenatal care, unspecified trimester: Secondary | ICD-10-CM

## 2012-08-19 DIAGNOSIS — O99210 Obesity complicating pregnancy, unspecified trimester: Secondary | ICD-10-CM

## 2012-08-19 DIAGNOSIS — O09292 Supervision of pregnancy with other poor reproductive or obstetric history, second trimester: Secondary | ICD-10-CM

## 2012-08-19 DIAGNOSIS — O34219 Maternal care for unspecified type scar from previous cesarean delivery: Secondary | ICD-10-CM

## 2012-08-19 DIAGNOSIS — O24419 Gestational diabetes mellitus in pregnancy, unspecified control: Secondary | ICD-10-CM | POA: Insufficient documentation

## 2012-08-19 LAB — POCT URINALYSIS DIP (DEVICE)
Bilirubin Urine: NEGATIVE
Glucose, UA: NEGATIVE mg/dL
Hgb urine dipstick: NEGATIVE
Ketones, ur: 15 mg/dL — AB
Specific Gravity, Urine: 1.02 (ref 1.005–1.030)

## 2012-08-19 MED ORDER — GLYBURIDE 2.5 MG PO TABS: 2.5000 mg | ORAL_TABLET | Freq: Two times a day (BID) | ORAL | Status: DC

## 2012-08-19 NOTE — Progress Notes (Signed)
U/S scheduled 08/22/12 at 1130 am.

## 2012-08-19 NOTE — Progress Notes (Signed)
Patient transferred care from HD secondary to GDM. Patient with h/o GDM in last pregnancy which resulted in fetal macrosomia requiring LTCS. Patient interestedin TOLAC pending EFW. Patient has been checking CBGS with fasting in 110's and postprandial 140-160. Will start glyburide 2.5 mg BID. Will schedule follow-up anatomy ultrasound secondary to poorly visualized anatomy

## 2012-08-19 NOTE — Progress Notes (Signed)
Pulse:109 

## 2012-08-21 ENCOUNTER — Encounter: Payer: Self-pay | Admitting: *Deleted

## 2012-08-22 ENCOUNTER — Ambulatory Visit (HOSPITAL_COMMUNITY)
Admission: RE | Admit: 2012-08-22 | Discharge: 2012-08-22 | Disposition: A | Discharge status: Home / Self Care | Payer: Medicaid Other | Source: Ambulatory Visit | Attending: Obstetrics and Gynecology | Admitting: Obstetrics and Gynecology

## 2012-08-22 ENCOUNTER — Encounter: Payer: Self-pay | Admitting: Obstetrics and Gynecology

## 2012-08-22 DIAGNOSIS — O09522 Supervision of elderly multigravida, second trimester: Secondary | ICD-10-CM

## 2012-08-22 DIAGNOSIS — O24419 Gestational diabetes mellitus in pregnancy, unspecified control: Secondary | ICD-10-CM

## 2012-08-22 DIAGNOSIS — O09529 Supervision of elderly multigravida, unspecified trimester: Secondary | ICD-10-CM | POA: Insufficient documentation

## 2012-08-22 DIAGNOSIS — O99810 Abnormal glucose complicating pregnancy: Secondary | ICD-10-CM | POA: Insufficient documentation

## 2012-08-22 DIAGNOSIS — Z3689 Encounter for other specified antenatal screening: Secondary | ICD-10-CM | POA: Insufficient documentation

## 2012-09-02 ENCOUNTER — Encounter: Payer: Self-pay | Admitting: Family Medicine

## 2012-09-02 ENCOUNTER — Ambulatory Visit (INDEPENDENT_AMBULATORY_CARE_PROVIDER_SITE_OTHER): Payer: Medicaid Other | Admitting: Family Medicine

## 2012-09-02 VITALS — BP 122/71 | Temp 97.9°F | Wt 299.5 lb

## 2012-09-02 DIAGNOSIS — O34219 Maternal care for unspecified type scar from previous cesarean delivery: Secondary | ICD-10-CM

## 2012-09-02 DIAGNOSIS — O99810 Abnormal glucose complicating pregnancy: Secondary | ICD-10-CM

## 2012-09-02 DIAGNOSIS — O24419 Gestational diabetes mellitus in pregnancy, unspecified control: Secondary | ICD-10-CM

## 2012-09-02 LAB — CBC
Hemoglobin: 12.2 g/dL (ref 12.0–15.0)
MCHC: 34.6 g/dL (ref 30.0–36.0)
Platelets: 278 10*3/uL (ref 150–400)
RDW: 14.7 % (ref 11.5–15.5)

## 2012-09-02 LAB — RPR

## 2012-09-02 LAB — POCT URINALYSIS DIP (DEVICE)
Bilirubin Urine: NEGATIVE
Leukocytes, UA: NEGATIVE
Nitrite: NEGATIVE
Protein, ur: 30 mg/dL — AB
pH: 7 (ref 5.0–8.0)

## 2012-09-02 LAB — HIV ANTIBODY (ROUTINE TESTING W REFLEX): HIV: NONREACTIVE

## 2012-09-02 MED ORDER — GLYBURIDE 5 MG PO TABS: 5.0000 mg | ORAL_TABLET | Freq: Two times a day (BID) | ORAL | Status: DC

## 2012-09-02 NOTE — Progress Notes (Signed)
Pulse: 99 

## 2012-09-02 NOTE — Progress Notes (Signed)
FBS 108-122 2 hr pp 86-165 Will increase Glyburide 5 mg bid

## 2012-09-02 NOTE — Patient Instructions (Addendum)
Gestational Diabetes Mellitus Gestational diabetes mellitus, often simply referred to as gestational diabetes, is a type of diabetes that some women develop during pregnancy. In gestational diabetes, the pancreas does not make enough insulin (a hormone), the cells are less responsive to the insulin that is made (insulin resistance), or both.Normally, insulin moves sugars from food into the tissue cells. The tissue cells use the sugars for energy. The lack of insulin or the lack of normal response to insulin causes excess sugars to build up in the blood instead of going into the tissue cells. As a result, high blood sugar (hyperglycemia) develops. The effect of high sugar (glucose) levels can cause many complications.  RISK FACTORS You have an increased chance of developing gestational diabetes if you have a family history of diabetes and also have one or more of the following risk factors:  A body mass index over 30 (obesity).  A previous pregnancy with gestational diabetes.  An older age at the time of pregnancy. If blood glucose levels are kept in the normal range during pregnancy, women can have a healthy pregnancy. If your blood glucose levels are not well controlled, there may be risks to you, your unborn baby (fetus), your labor and delivery, or your newborn baby.  SYMPTOMS  If symptoms are experienced, they are much like symptoms you would normally expect during pregnancy. The symptoms of gestational diabetes include:   Increased thirst (polydipsia).  Increased urination (polyuria).  Increased urination during the night (nocturia).  Weight loss. This weight loss may be rapid.  Frequent, recurring infections.  Tiredness (fatigue).  Weakness.  Vision changes, such as blurred vision.  Fruity smell to your breath.  Abdominal pain. DIAGNOSIS Diabetes is diagnosed when blood glucose levels are increased. Your blood glucose level may be checked by one or more of the following  blood tests:  A fasting blood glucose test. You will not be allowed to eat for at least 8 hours before a blood sample is taken.  A random blood glucose test. Your blood glucose is checked at any time of the day regardless of when you ate.  A hemoglobin A1c blood glucose test. A hemoglobin A1c test provides information about blood glucose control over the previous 3 months.  An oral glucose tolerance test (OGTT). Your blood glucose is measured after you have not eaten (fasted) for 1 3 hours and then after you drink a glucose-containing beverage. Since the hormones that cause insulin resistance are highest at about 24 28 weeks of a pregnancy, an OGTT is usually performed during that time. If you have risk factors for gestational diabetes, your caregiver may test you for gestational diabetes earlier than 24 weeks of pregnancy. TREATMENT   You will need to take diabetes medicine or insulin daily to keep blood glucose levels in the desired range.  You will need to match insulin dosing with exercise and healthy food choices. The treatment goal is to maintain the before meal (preprandial), bedtime, and overnight blood glucose level at 60 99 mg/dL during pregnancy. The treatment goal is to further maintain peak after meal blood sugar (postprandial glucose) level at 100 140 mg/dL.  HOME CARE INSTRUCTIONS   Have your hemoglobin A1c level checked twice a year.  Perform daily blood glucose monitoring as directed by your caregiver. It is common to perform frequent blood glucose monitoring.  Monitor urine ketones when you are ill and as directed by your caregiver.  Take your diabetes medicine and insulin as directed by your caregiver to   maintain your blood glucose level in the desired range.  Never run out of diabetes medicine or insulin. It is needed every day.  Adjust insulin based on your intake of carbohydrates. Carbohydrates can raise blood glucose levels but need to be included in your diet.  Carbohydrates provide vitamins, minerals, and fiber which are an essential part of a healthy diet. Carbohydrates are found in fruits, vegetables, whole grains, dairy products, legumes, and foods containing added sugars.    Eat healthy foods. Alternate 3 meals with 3 snacks.  Maintain a healthy weight gain. The usual total expected weight gain varies according to your prepregnancy body mass index (BMI).  Carry a medical alert card or wear your medical alert jewelry.  Carry a 15 gram carbohydrate snack with you at all times to treat low blood glucose (hypoglycemia). Some examples of 15 gram carbohydrate snacks include:  Glucose tablets, 3 or 4   Glucose gel, 15 gram tube  Raisins, 2 tablespoons (24 g)  Jelly beans, 6  Animal crackers, 8  Fruit juice, regular soda, or low fat milk, 4 ounces (120 mL)  Gummy treats, 9    Recognize hypoglycemia. Hypoglycemia during pregnancy occurs with blood glucose levels of 60 mg/dL and below. The risk for hypoglycemia increases when fasting or skipping meals, during or after intense exercise, and during sleep. Hypoglycemia symptoms can include:  Tremors or shakes.  Decreased ability to concentrate.  Sweating.  Increased heart rate.  Headache.  Dry mouth.  Hunger.  Irritability.  Anxiety.  Restless sleep.  Altered speech or coordination.  Confusion.  Treat hypoglycemia promptly. If you are alert and able to safely swallow, follow the 15:15 rule:  Take 15 20 grams of rapid-acting glucose or carbohydrate. Rapid-acting options include glucose gel, glucose tablets, or 4 ounces (120 mL) of fruit juice, regular soda, or low fat milk.  Check your blood glucose level 15 minutes after taking the glucose.   Take 15 20 grams more of glucose if the repeat blood glucose level is still 70 mg/dL or below.  Eat a meal or snack within 1 hour once blood glucose levels return to normal.  Be alert to polyuria and polydipsia which are early  signs of hyperglycemia. An early awareness of hyperglycemia allows for prompt treatment. Treat hyperglycemia as directed by your caregiver.  Engage in at least 30 minutes of physical activity a day or as directed by your caregiver. Ten minutes of physical activity timed 30 minutes after each meal is encouraged to control postprandial blood glucose levels.  Adjust your insulin dosing and food intake as needed if you start a new exercise or sport.  Follow your sick day plan at any time you are unable to eat or drink as usual.  Avoid tobacco and alcohol use.  Follow up with your caregiver regularly.  Follow the advice of your caregiver regarding your prenatal and post-delivery (postpartum) appointments, meal planning, exercise, medicines, vitamins, blood tests, other medical tests, and physical activities.  Perform daily skin and foot care. Examine your skin and feet daily for cuts, bruises, redness, nail problems, bleeding, blisters, or sores.  Brush your teeth and gums at least twice a day and floss at least once a day. Follow up with your dentist regularly.  Schedule an eye exam during the first trimester of your pregnancy or as directed by your caregiver.  Share your diabetes management plan with your workplace or school.  Stay up-to-date with immunizations.  Learn to manage stress.  Obtain ongoing diabetes education   and support as needed. SEEK MEDICAL CARE IF:   You are unable to eat food or drink fluids for more than 6 hours.  You have nausea and vomiting for more than 6 hours.  You have a blood glucose level of 200 mg/dL and you have ketones in your urine.  There is a change in mental status.  You develop vision problems.  You have a persistent headache.  You have upper abdominal pain or discomfort.  You develop an additional serious illness.  You have diarrhea for more than 6 hours.  You have been sick or have had a fever for a couple of days and are not getting  better. SEEK IMMEDIATE MEDICAL CARE IF:   You have difficulty breathing.  You no longer feel the baby moving.  You are bleeding or have discharge from your vagina.  You start having premature contractions or labor. MAKE SURE YOU:  Understand these instructions.  Will watch your condition.  Will get help right away if you are not doing well or get worse. Document Released: 06/26/2000 Document Revised: 12/13/2011 Document Reviewed: 10/17/2011 [facility] Patient Information 2014 [place], [place].  Breastfeeding A change in hormones during your pregnancy causes growth of your breast tissue and an increase in number and size of milk ducts. The hormone prolactin allows proteins, sugars, and fats from your blood supply to make breast milk in your milk-producing glands. The hormone progesterone prevents breast milk from being released before the birth of your baby. After the birth of your baby, your progesterone level decreases allowing breast milk to be released. Thoughts of your baby, as well as his or her sucking or crying, can stimulate the release of milk from the milk-producing glands. Deciding to breastfeed (nurse) is one of the best choices you can make for you and your baby. The information that follows gives a brief review of the benefits, as well as other important skills to know about breastfeeding. BENEFITS OF BREASTFEEDING For your baby  The first milk (colostrum) helps your baby's digestive system function better.   There are antibodies in your milk that help your baby fight off infections.   Your baby has a lower incidence of asthma, allergies, and sudden infant death syndrome (SIDS).   The nutrients in breast milk are better for your baby than infant formulas.  Breast milk improves your baby's brain development.   Your baby will have less gas, colic, and constipation.  Your baby is less likely to develop other conditions, such as childhood obesity, asthma, or diabetes  mellitus. For you  Breastfeeding helps develop a very special bond between you and your baby.   Breastfeeding is convenient, always available at the correct temperature, and costs nothing.   Breastfeeding helps to burn calories and helps you lose the weight gained during pregnancy.   Breastfeeding makes your uterus contract back down to normal size faster and slows bleeding following delivery.   Breastfeeding mothers have a lower risk of developing osteoporosis or breast or ovarian cancer later in life.  BREASTFEEDING FREQUENCY  A healthy, full-term baby may breastfeed as often as every hour or space his or her feedings to every 3 hours. Breastfeeding frequency will vary from baby to baby.   Newborns should be fed no less than every 2 3 hours during the day and every 4 5 hours during the night. You should breastfeed a minimum of 8 feedings in a 24 hour period.  Awaken your baby to breastfeed if it has been 3 4  hours since the last feeding.  Breastfeed when you feel the need to reduce the fullness of your breasts or when your newborn shows signs of hunger. Signs that your baby may be hungry include:  Increased alertness or activity.  Stretching.  Movement of the head from side to side.  Movement of the head and opening of the mouth when the corner of the mouth or cheek is stroked (rooting).  Increased sucking sounds, smacking lips, cooing, sighing, or squeaking.  Hand-to-mouth movements.  Increased sucking of fingers or hands.  Fussing.  Intermittent crying.  Signs of extreme hunger will require calming and consoling before you try to feed your baby. Signs of extreme hunger may include:  Restlessness.  A loud, strong cry.  Screaming.  Frequent feeding will help you make more milk and will help prevent problems, such as sore nipples and engorgement of the breasts.  BREASTFEEDING   Whether lying down or sitting, be sure that the baby's abdomen is facing your  abdomen.   Support your breast with 4 fingers under your breast and your thumb above your nipple. Make sure your fingers are well away from your nipple and your baby's mouth.   Stroke your baby's lips gently with your finger or nipple.   When your baby's mouth is open wide enough, place all of your nipple and as much of the colored area around your nipple (areola) as possible into your baby's mouth.  More areola should be visible above his or her upper lip than below his or her lower lip.  Your baby's tongue should be between his or her lower gum and your breast.  Ensure that your baby's mouth is correctly positioned around the nipple (latched). Your baby's lips should create a seal on your breast.  Signs that your baby has effectively latched onto your nipple include:  Tugging or sucking without pain.  Swallowing heard between sucks.  Absent click or smacking sound.  Muscle movement above and in front of his or her ears with sucking.  Your baby must suck about 2 3 minutes in order to get your milk. Allow your baby to feed on each breast as long as he or she wants. Nurse your baby until he or she unlatches or falls asleep at the first breast, then offer the second breast.  Signs that your baby is full and satisfied include:  A gradual decrease in the number of sucks or complete cessation of sucking.  Falling asleep.  Extension or relaxation of his or her body.  Retention of a small amount of milk in his or her mouth.  Letting go of your breast by himself or herself.  Signs of effective breastfeeding in you include:  Breasts that have increased firmness, weight, and size prior to feeding.  Breasts that are softer after nursing.  Increased milk volume, as well as a change in milk consistency and color by the 5th day of breastfeeding.  Breast fullness relieved by breastfeeding.  Nipples are not sore, cracked, or bleeding.  If needed, break the suction by putting your  finger into the corner of your baby's mouth and sliding your finger between his or her gums. Then, remove your breast from his or her mouth.  It is common for babies to spit up a small amount after a feeding.  Babies often swallow air during feeding. This can make babies fussy. Burping your baby between breasts can help with this.  Vitamin D supplements are recommended for babies who get only  breast milk.  Avoid using a pacifier during your baby's first 4 6 weeks.  Avoid supplemental feedings of water, formula, or juice in place of breastfeeding. Breast milk is all the food your baby needs. It is not necessary for your baby to have water or formula. Your breasts will make more milk if supplemental feedings are avoided during the early weeks. HOW TO TELL WHETHER YOUR BABY IS GETTING ENOUGH BREAST MILK Wondering whether or not your baby is getting enough milk is a common concern among mothers. You can be assured that your baby is getting enough milk if:   Your baby is actively sucking and you hear swallowing.   Your baby seems relaxed and satisfied after a feeding.   Your baby nurses at least 8 12 times in a 24 hour time period.  During the first 52 83 days of age:  Your baby is wetting at least 3 5 diapers in a 24 hour period. The urine should be clear and pale yellow.  Your baby is having at least 3 4 stools in a 24 hour period. The stool should be soft and yellow.  At 64 56 days of age, your baby is having at least 3 6 stools in a 24 hour period. The stool should be seedy and yellow by 28 days of age.  Your baby has a weight loss less than 7 10% during the first 83 days of age.  Your baby does not lose weight after 22 32 days of age.  Your baby gains 4 7 ounces each week after he or she is 1 days of age.  Your baby gains weight by 35 days of age and is back to birth weight within 2 weeks. ENGORGEMENT In the first week after your baby is born, you may experience extremely full breasts  (engorgement). When engorged, your breasts may feel heavy, warm, or tender to the touch. Engorgement peaks within 24 48 hours after delivery of your baby.  Engorgement may be reduced by:  Continuing to breastfeed.  Increasing the frequency of breastfeeding.  Taking warm showers or applying warm, moist heat to your breasts just before each feeding. This increases circulation and helps the milk flow.   Gently massaging your breast before and during the feedings. With your fingertips, massage from your chest wall towards your nipple in a circular motion.   Ensuring that your baby empties at least one breast at every feeding. It also helps to start the next feeding on the opposite breast.   Expressing breast milk by hand or by using a breast pump to empty the breasts if your baby is sleepy, or not nursing well. You may also want to express milk if you are returning to work oryou feel you are getting engorged.  Ensuring your baby is latched on and positioned properly while breastfeeding. If you follow these suggestions, your engorgement should improve in 24 48 hours. If you are still experiencing difficulty, call your lactation consultant or caregiver.  CARING FOR YOURSELF Take care of your breasts.  Bathe or shower daily.   Avoid using soap on your nipples.   Wear a supportive bra. Avoid wearing underwire style bras.  Air dry your nipples for a 3 after each feeding.   Use only cotton bra pads to absorb breast milk leakage. Leaking of breast milk between feedings is normal.   Use only pure lanolin on your nipples after nursing. You do not need to wash it off before feeding your baby again.  Another option is to express a few drops of breast milk and gently massage that milk into your nipples.  Continue breast self-awareness checks. Take care of yourself.  Eat healthy foods. Alternate 3 meals with 3 snacks.  Avoid foods that you notice affect your baby in a bad  way.  Drink milk, fruit juice, and water to satisfy your thirst (about 8 glasses a day).   Rest often, relax, and take your prenatal vitamins to prevent fatigue, stress, and anemia.  Avoid chewing and smoking tobacco.  Avoid alcohol and drug use.  Take over-the-counter and prescribed medicine only as directed by your caregiver or pharmacist. You should always check with your caregiver or pharmacist before taking any new medicine, vitamin, or herbal supplement.  Know that pregnancy is possible while breastfeeding. If desired, talk to your caregiver about family planning and safe birth control methods that may be used while breastfeeding. SEEK MEDICAL CARE IF:   You feel like you want to stop breastfeeding or have become frustrated with breastfeeding.  You have painful breasts or nipples.  Your nipples are cracked or bleeding.  Your breasts are red, tender, or warm.  You have a swollen area on either breast.  You have a fever or chills.  You have nausea or vomiting.  You have drainage from your nipples.  Your breasts do not become full before feedings by the 5th day after delivery.  You feel sad and depressed.  Your baby is too sleepy to eat well.  Your baby is having trouble sleeping.   Your baby is wetting less than 3 diapers in a 24 hour period.  Your baby has less than 3 stools in a 24 hour period.  Your baby's skin or the white part of his or her eyes becomes more yellow.   Your baby is not gaining weight by 51 days of age. MAKE SURE YOU:   Understand these instructions.  Will watch your condition.  Will get help right away if you are not doing well or get worse. Document Released: 03/20/2005 Document Revised: 12/13/2011 Document Reviewed: 10/25/2011 [facility] Patient Information 2014 [place], [place]. Vaginal Birth After Cesarean Delivery Vaginal birth after Cesarean delivery (VBAC) is giving birth vaginally after previously delivering a baby by a cesarean.  In the past, if a woman had a Cesarean delivery, all births afterwards would be done by Cesarean delivery. This is no longer true. It can be safe for the mother to try a vaginal delivery after having a Cesarean. The final decision to have a VBAC or repeat Cesarean delivery should be between the patient and her caregiver. The risks and benefits can be discussed relative to the reason for, and the type of the previous Cesarean delivery. WOMEN WHO PLAN TO HAVE A VBAC SHOULD CHECK WITH THEIR DOCTOR TO BE SURE THAT:  The previous Cesarean was done with a low transverse uterine incision (not a vertical classical incision).  The birth canal is big enough for the baby.  There were no other operations on the uterus.  They will have an electronic fetal monitor (EFM) on at all times during labor.  An operating room would be available and ready in case an emergency Cesarean is needed.  A doctor and surgical nursing staff would be available at all times during labor to be ready to do an emergency Cesarean if necessary.  An anesthesiologist would be present in case an emergency Cesarean is needed.  The nursery is prepared and has adequate personnel and necessary equipment  available to care for the baby in case of an emergency Cesarean. BENEFITS OF VBAC:  Shorter stay in the hospital.  Lower delivery, nursery and hospital costs.  Less blood loss and need for blood transfusions.  Less fever and discomfort from major surgery.  Lower risk of blood clots.  Lower risk of infection.  Shorter recovery after going home.  Lower risk of other surgical complications, such as opening of the incision or hernia in the incision.  Decreased risk of injury to other organs.  Decreased risk for having to remove the uterus (hysterectomy).  Decreased risk for the placenta to completely or partially cover the opening of the uterus (placenta previa) with a future pregnancy.  Ability to have a larger family if  desired. RISKS OF A VBAC:  Rupture of the uterus.  Having to remove the uterus (hysterectomy) if it ruptures.  All the complications of major surgery and/or injury to other organs.  Excessive bleeding, blood clots and infection.  Lower Apgar scores (method to evaluate the newborn based on appearance, pulse, grimace, activity, and respiration) and more risks to the baby.  There is a higher risk of uterine rupture if you induce or augment labor.  There is a higher risk of uterine rupture if you use medications to ripen the cervix. VBAC SHOULD NOT BE DONE IF:  The previous Cesarean was done with a vertical (classical) or T-shaped incision, or you do not know what kind of an incision was made.  You had a ruptured uterus.  You had surgery on your uterus.  You have medical or obstetrical problems.  There are problems with the baby.  There were two previous Cesarean deliveries and no vaginal deliveries. OTHER FACTS TO KNOW ABOUT VBAC:  It is safe to have an epidural anesthetic with VBAC.  It is safe to turn the baby from a breech position (attempt an external cephalic version).  It is safe to try a VBAC with twins.  Pregnancies later than 40 weeks have not been successful with VBAC.  There is an increased failure rate of a VBAC in obese pregnant women.  There is an increased failure rate with VABC if the baby weighs 8.8 pounds (4000 grams) or more.  There is an increased failure rate if the time between the Cesarean and VBAC is less than 19 months.  There is an increased failure rate if pre-eclampsia is present (high blood pressure, protein in the urine and swelling of face and extremities).  VBAC is very successful if there was a previous vaginal birth.  VBAC is very successful when the labor starts spontaneously before the due date.  Delivery of VBAC is similar to having a normal spontaneous vaginal delivery. It is important to discuss VBAC with your caregiver early in  the pregnancy so you can understand the risks, benefits and options. It will give you time to decide what is best in your particular case relevant to the reason for your previous Cesarean delivery. It should be understood that medical changes in the mother or pregnancy may occur during the pregnancy, which make it necessary to change you or your caregiver's initial decision. The counseling, concerns and decisions should be documented in the medical record and signed by all parties. Document Released: 09/10/2006 Document Revised: 06/12/2011 Document Reviewed: 05/01/2008 [facility] Patient Information 2014 [place], [place].

## 2012-09-09 ENCOUNTER — Encounter: Payer: Medicaid Other | Attending: Family | Admitting: *Deleted

## 2012-09-09 ENCOUNTER — Ambulatory Visit (INDEPENDENT_AMBULATORY_CARE_PROVIDER_SITE_OTHER): Payer: Medicaid Other | Admitting: Family Medicine

## 2012-09-09 VITALS — BP 125/66 | Temp 97.7°F | Wt 299.0 lb

## 2012-09-09 DIAGNOSIS — O99810 Abnormal glucose complicating pregnancy: Secondary | ICD-10-CM

## 2012-09-09 DIAGNOSIS — Z713 Dietary counseling and surveillance: Secondary | ICD-10-CM | POA: Insufficient documentation

## 2012-09-09 DIAGNOSIS — O24419 Gestational diabetes mellitus in pregnancy, unspecified control: Secondary | ICD-10-CM

## 2012-09-09 LAB — POCT URINALYSIS DIP (DEVICE)
Leukocytes, UA: NEGATIVE
Nitrite: NEGATIVE
Protein, ur: 30 mg/dL — AB
Urobilinogen, UA: 0.2 mg/dL (ref 0.0–1.0)

## 2012-09-09 MED ORDER — METFORMIN HCL 500 MG PO TABS: 500.0000 mg | ORAL_TABLET | Freq: Two times a day (BID) | ORAL | Status: DC

## 2012-09-09 NOTE — Progress Notes (Signed)
FBS 109-127 - all out of range. PP:  91-145 - 14/18 above range.  On glyburide 5 mg bid. Not severely out of range so will add metformin 500 mg BID. Spent a lot of time with diabetes educator on diet.

## 2012-09-09 NOTE — Progress Notes (Signed)
09/09/2012  Fastings generally all elevated from 91-135 mg/dL. MD added Metformin 500 bid to regimen which hopefully will help to lower fasting.  Elevated fasting glucose will typically influence the rest of the day's glucose checks because she is starting with elevations. Pt states she eats a double portion of carbohydrates so that she is not hungry in the afternoon. Reviewed portion sizes of what she eats for lunch: chicken, rice, potatoes and beans.  Instructed pt to eat 1/2 the portion she is eating for lunch and to eat the other portion mid-afternoon.  The rest or some of the lunch she was eating, she could eat for snack.. All other meals and snacks appropriate by her descriptions and listings. To return in one week to check effectiveness of Metformin, although metformin may take 2-3 weeks to see results. Thank you, Lenor Coffin, RN, Diabetes [facility] [phone])

## 2012-09-09 NOTE — Progress Notes (Signed)
P-101 

## 2012-09-09 NOTE — Patient Instructions (Addendum)

## 2012-09-10 ENCOUNTER — Telehealth: Payer: Self-pay | Admitting: *Deleted

## 2012-09-10 DIAGNOSIS — O24419 Gestational diabetes mellitus in pregnancy, unspecified control: Secondary | ICD-10-CM

## 2012-09-10 MED ORDER — GLUCOSE BLOOD VI STRP: ORAL_STRIP | Status: DC

## 2012-09-10 NOTE — Telephone Encounter (Signed)
Husband of pt called stating that he is having a problem getting glucose test strips for his wife. He said that [facility] is asking him to pay out of pocket for the Rx. I told him that from the Rx it seems they need additional information from [place] in order for Medicaid to cover the Rx. I will send the info to the pharmacy. He then asked for the Rx to be sent to [facility] on [address]  I agreed. No additional questions.

## 2012-09-16 ENCOUNTER — Encounter: Payer: Medicaid Other | Admitting: *Deleted

## 2012-09-16 ENCOUNTER — Encounter: Payer: Self-pay | Admitting: Obstetrics and Gynecology

## 2012-09-16 ENCOUNTER — Ambulatory Visit (INDEPENDENT_AMBULATORY_CARE_PROVIDER_SITE_OTHER): Payer: Medicaid Other | Admitting: Obstetrics & Gynecology

## 2012-09-16 DIAGNOSIS — O99810 Abnormal glucose complicating pregnancy: Secondary | ICD-10-CM

## 2012-09-16 DIAGNOSIS — O24419 Gestational diabetes mellitus in pregnancy, unspecified control: Secondary | ICD-10-CM

## 2012-09-16 NOTE — Progress Notes (Signed)
Patient ID: Norma Chambers, female   DOB: [birth year], 42 y.o.   MRN: [id number] 09/17/2000 Pt returned to clinic this am with logbook and meter.Have seen this patient and husband in [facility] 1.5 weeks ago. Fastings are in low 100's and one of 79 mg/dL. Reviewed diet with her and husband. Sounds as if she is eating perfectly. Her post-prandials are well controlled, but her fastings are slightly elevated.  However, she states she is eating her supper at 9 pm and then has snack before bedtime at 12 midnight. Explained importance of eating no later than 8pm and taking her Glyburide at 10 pm. Other than her supper being late, her glucose values are controlled.  If her fastings are still high, I recommend increase in her Glyburide at HS to 10 mg rather than 5 mg. Thank you, Lenor Coffin, RN, Diabetes [facility] [phone])

## 2012-09-23 ENCOUNTER — Ambulatory Visit (INDEPENDENT_AMBULATORY_CARE_PROVIDER_SITE_OTHER): Payer: Medicaid Other | Admitting: Obstetrics & Gynecology

## 2012-09-23 VITALS — BP 108/75 | Temp 97.2°F | Wt 303.6 lb

## 2012-09-23 DIAGNOSIS — O09529 Supervision of elderly multigravida, unspecified trimester: Secondary | ICD-10-CM

## 2012-09-23 LAB — POCT URINALYSIS DIP (DEVICE)
Glucose, UA: >=1000 mg/dL — AB
Nitrite: NEGATIVE
Urobilinogen, UA: 0.2 mg/dL (ref 0.0–1.0)

## 2012-09-23 MED ORDER — METFORMIN HCL 500 MG PO TABS: ORAL_TABLET | ORAL | Status: DC

## 2012-09-23 NOTE — Progress Notes (Signed)
Pulse: 104

## 2012-09-23 NOTE — Progress Notes (Signed)
U/S scheduled 10/01/12 at 945 am.

## 2012-09-23 NOTE — Progress Notes (Signed)
Fasting BS usually 100-06, will increase metformin to 1000 mg at night.

## 2012-09-23 NOTE — Patient Instructions (Signed)

## 2012-09-30 ENCOUNTER — Ambulatory Visit (INDEPENDENT_AMBULATORY_CARE_PROVIDER_SITE_OTHER): Payer: Medicaid Other | Admitting: Family Medicine

## 2012-09-30 VITALS — BP 121/83 | Temp 97.4°F | Wt 300.6 lb

## 2012-09-30 DIAGNOSIS — Z23 Encounter for immunization: Secondary | ICD-10-CM

## 2012-09-30 DIAGNOSIS — O99810 Abnormal glucose complicating pregnancy: Secondary | ICD-10-CM

## 2012-09-30 DIAGNOSIS — O09299 Supervision of pregnancy with other poor reproductive or obstetric history, unspecified trimester: Secondary | ICD-10-CM

## 2012-09-30 DIAGNOSIS — O24419 Gestational diabetes mellitus in pregnancy, unspecified control: Secondary | ICD-10-CM

## 2012-09-30 LAB — POCT URINALYSIS DIP (DEVICE)
Bilirubin Urine: NEGATIVE
Glucose, UA: NEGATIVE mg/dL
Leukocytes, UA: NEGATIVE

## 2012-09-30 MED ORDER — TETANUS-DIPHTH-ACELL PERTUSSIS 5-2.5-18.5 LF-MCG/0.5 IM SUSP: 0.5000 mL | Freq: Once | INTRAMUSCULAR | Status: DC

## 2012-09-30 NOTE — Progress Notes (Signed)
FBS 95-103-only one above 100- which is an improvement. 2 hr pp 84-128 (9 of 21) out of range, but many at 121, 122.--U/S for growth tomorrow S>D.  Needs to start 2x/wk testing.

## 2012-09-30 NOTE — Patient Instructions (Signed)
Gestational Diabetes Mellitus Gestational diabetes mellitus, often simply referred to as gestational diabetes, is a type of diabetes that some women develop during pregnancy. In gestational diabetes, the pancreas does not make enough insulin (a hormone), the cells are less responsive to the insulin that is made (insulin resistance), or both.Normally, insulin moves sugars from food into the tissue cells. The tissue cells use the sugars for energy. The lack of insulin or the lack of normal response to insulin causes excess sugars to build up in the blood instead of going into the tissue cells. As a result, high blood sugar (hyperglycemia) develops. The effect of high sugar (glucose) levels can cause many complications.  RISK FACTORS You have an increased chance of developing gestational diabetes if you have a family history of diabetes and also have one or more of the following risk factors:  A body mass index over 30 (obesity).  A previous pregnancy with gestational diabetes.  An older age at the time of pregnancy. If blood glucose levels are kept in the normal range during pregnancy, women can have a healthy pregnancy. If your blood glucose levels are not well controlled, there may be risks to you, your unborn baby (fetus), your labor and delivery, or your newborn baby.  SYMPTOMS  If symptoms are experienced, they are much like symptoms you would normally expect during pregnancy. The symptoms of gestational diabetes include:   Increased thirst (polydipsia).  Increased urination (polyuria).  Increased urination during the night (nocturia).  Weight loss. This weight loss may be rapid.  Frequent, recurring infections.  Tiredness (fatigue).  Weakness.  Vision changes, such as blurred vision.  Fruity smell to your breath.  Abdominal pain. DIAGNOSIS Diabetes is diagnosed when blood glucose levels are increased. Your blood glucose level may be checked by one or more of the following  blood tests:  A fasting blood glucose test. You will not be allowed to eat for at least 8 hours before a blood sample is taken.  A random blood glucose test. Your blood glucose is checked at any time of the day regardless of when you ate.  A hemoglobin A1c blood glucose test. A hemoglobin A1c test provides information about blood glucose control over the previous 3 months.  An oral glucose tolerance test (OGTT). Your blood glucose is measured after you have not eaten (fasted) for 1 3 hours and then after you drink a glucose-containing beverage. Since the hormones that cause insulin resistance are highest at about 24 28 weeks of a pregnancy, an OGTT is usually performed during that time. If you have risk factors for gestational diabetes, your caregiver may test you for gestational diabetes earlier than 24 weeks of pregnancy. TREATMENT   You will need to take diabetes medicine or insulin daily to keep blood glucose levels in the desired range.  You will need to match insulin dosing with exercise and healthy food choices. The treatment goal is to maintain the before meal (preprandial), bedtime, and overnight blood glucose level at 60 99 mg/dL during pregnancy. The treatment goal is to further maintain peak after meal blood sugar (postprandial glucose) level at 100 140 mg/dL.  HOME CARE INSTRUCTIONS   Have your hemoglobin A1c level checked twice a year.  Perform daily blood glucose monitoring as directed by your caregiver. It is common to perform frequent blood glucose monitoring.  Monitor urine ketones when you are ill and as directed by your caregiver.  Take your diabetes medicine and insulin as directed by your caregiver to   maintain your blood glucose level in the desired range.  Never run out of diabetes medicine or insulin. It is needed every day.  Adjust insulin based on your intake of carbohydrates. Carbohydrates can raise blood glucose levels but need to be included in your diet.  Carbohydrates provide vitamins, minerals, and fiber which are an essential part of a healthy diet. Carbohydrates are found in fruits, vegetables, whole grains, dairy products, legumes, and foods containing added sugars.    Eat healthy foods. Alternate 3 meals with 3 snacks.  Maintain a healthy weight gain. The usual total expected weight gain varies according to your prepregnancy body mass index (BMI).  Carry a medical alert card or wear your medical alert jewelry.  Carry a 15 gram carbohydrate snack with you at all times to treat low blood glucose (hypoglycemia). Some examples of 15 gram carbohydrate snacks include:  Glucose tablets, 3 or 4   Glucose gel, 15 gram tube  Raisins, 2 tablespoons (24 g)  Jelly beans, 6  Animal crackers, 8  Fruit juice, regular soda, or low fat milk, 4 ounces (120 mL)  Gummy treats, 9    Recognize hypoglycemia. Hypoglycemia during pregnancy occurs with blood glucose levels of 60 mg/dL and below. The risk for hypoglycemia increases when fasting or skipping meals, during or after intense exercise, and during sleep. Hypoglycemia symptoms can include:  Tremors or shakes.  Decreased ability to concentrate.  Sweating.  Increased heart rate.  Headache.  Dry mouth.  Hunger.  Irritability.  Anxiety.  Restless sleep.  Altered speech or coordination.  Confusion.  Treat hypoglycemia promptly. If you are alert and able to safely swallow, follow the 15:15 rule:  Take 15 20 grams of rapid-acting glucose or carbohydrate. Rapid-acting options include glucose gel, glucose tablets, or 4 ounces (120 mL) of fruit juice, regular soda, or low fat milk.  Check your blood glucose level 15 minutes after taking the glucose.   Take 15 20 grams more of glucose if the repeat blood glucose level is still 70 mg/dL or below.  Eat a meal or snack within 1 hour once blood glucose levels return to normal.  Be alert to polyuria and polydipsia which are early  signs of hyperglycemia. An early awareness of hyperglycemia allows for prompt treatment. Treat hyperglycemia as directed by your caregiver.  Engage in at least 30 minutes of physical activity a day or as directed by your caregiver. Ten minutes of physical activity timed 30 minutes after each meal is encouraged to control postprandial blood glucose levels.  Adjust your insulin dosing and food intake as needed if you start a new exercise or sport.  Follow your sick day plan at any time you are unable to eat or drink as usual.  Avoid tobacco and alcohol use.  Follow up with your caregiver regularly.  Follow the advice of your caregiver regarding your prenatal and post-delivery (postpartum) appointments, meal planning, exercise, medicines, vitamins, blood tests, other medical tests, and physical activities.  Perform daily skin and foot care. Examine your skin and feet daily for cuts, bruises, redness, nail problems, bleeding, blisters, or sores.  Brush your teeth and gums at least twice a day and floss at least once a day. Follow up with your dentist regularly.  Schedule an eye exam during the first trimester of your pregnancy or as directed by your caregiver.  Share your diabetes management plan with your workplace or school.  Stay up-to-date with immunizations.  Learn to manage stress.  Obtain ongoing diabetes education   and support as needed. SEEK MEDICAL CARE IF:   You are unable to eat food or drink fluids for more than 6 hours.  You have nausea and vomiting for more than 6 hours.  You have a blood glucose level of 200 mg/dL and you have ketones in your urine.  There is a change in mental status.  You develop vision problems.  You have a persistent headache.  You have upper abdominal pain or discomfort.  You develop an additional serious illness.  You have diarrhea for more than 6 hours.  You have been sick or have had a fever for a couple of days and are not getting  better. SEEK IMMEDIATE MEDICAL CARE IF:   You have difficulty breathing.  You no longer feel the baby moving.  You are bleeding or have discharge from your vagina.  You start having premature contractions or labor. MAKE SURE YOU:  Understand these instructions.  Will watch your condition.  Will get help right away if you are not doing well or get worse. Document Released: 06/26/2000 Document Revised: 12/13/2011 Document Reviewed: 10/17/2011 [employer] Patient Information 2014 [employer].  Pregnancy - Third Trimester The third trimester of pregnancy (the last 3 months) is a period of the most rapid growth for you and your baby. The baby approaches a length of 20 inches and a weight of 6 to 10 pounds. The baby is adding on fat and getting ready for life outside your body. While inside, babies have periods of sleeping and waking, sucking thumbs, and hiccuping. You can often feel small contractions of the uterus. This is false labor. It is also called Braxton-Hicks contractions. This is like a practice for labor. The usual problems in this stage of pregnancy include more difficulty breathing, swelling of the hands and feet from water retention, and having to urinate more often because of the uterus and baby pressing on your bladder.  PRENATAL EXAMS  Blood work may continue to be done during prenatal exams. These tests are done to check on your health and the probable health of your baby. Blood work is used to follow your blood levels (hemoglobin). Anemia (low hemoglobin) is common during pregnancy. Iron and vitamins are given to help prevent this. You may also continue to be checked for diabetes. Some of the past blood tests may be done again.  The size of the uterus is measured during each visit. This makes sure your baby is growing properly according to your pregnancy dates.  Your blood pressure is checked every prenatal visit. This is to make sure you are not getting toxemia.  Your  urine is checked every prenatal visit for infection, diabetes, and protein.  Your weight is checked at each visit. This is done to make sure gains are happening at the suggested rate and that you and your baby are growing normally.  Sometimes, an ultrasound is performed to confirm the position and the proper growth and development of the baby. This is a test done that bounces harmless sound waves off the baby so your caregiver can more accurately determine a due date.  Discuss the type of pain medicine and anesthesia you will have during your labor and delivery.  Discuss the possibility and anesthesia if a cesarean section might be necessary.  Inform your caregiver if there is any mental or physical violence at home. Sometimes, a specialized non-stress test, contraction stress test, and biophysical profile are done to make sure the baby is not having a problem. Checking   the amniotic fluid surrounding the baby is called an amniocentesis. The amniotic fluid is removed by sticking a needle into the belly (abdomen). This is sometimes done near the end of pregnancy if an early delivery is required. In this case, it is done to help make sure the baby's lungs are mature enough for the baby to live outside of the womb. If the lungs are not mature and it is unsafe to deliver the baby, an injection of cortisone medicine is given to the mother 1 to 2 days before the delivery. This helps the baby's lungs mature and makes it safer to deliver the baby. CHANGES OCCURING IN THE THIRD TRIMESTER OF PREGNANCY Your body goes through many changes during pregnancy. They vary from person to person. Talk to your caregiver about changes you notice and are concerned about.  During the last trimester, you have probably had an increase in your appetite. It is normal to have cravings for certain foods. This varies from person to person and pregnancy to pregnancy.  You may begin to get stretch marks on your hips, abdomen, and  breasts. These are normal changes in the body during pregnancy. There are no exercises or medicines to take which prevent this change.  Constipation may be treated with a stool softener or adding bulk to your diet. Drinking lots of fluids, fiber in vegetables, fruits, and whole grains are helpful.  Exercising is also helpful. If you have been very active up until your pregnancy, most of these activities can be continued during your pregnancy. If you have been less active, it is helpful to start an exercise program such as walking. Consult your caregiver before starting exercise programs.  Avoid all smoking, alcohol, non-prescribed drugs, herbs and "street drugs" during your pregnancy. These chemicals affect the formation and growth of the baby. Avoid chemicals throughout the pregnancy to ensure the delivery of a healthy infant.  Backache, varicose veins, and hemorrhoids may develop or get worse.  You will tire more easily in the third trimester, which is normal.  The baby's movements may be stronger and more often.  You may become short of breath easily.  Your belly button may stick out.  A yellow discharge may leak from your breasts called colostrum.  You may have a bloody mucus discharge. This usually occurs a few days to a week before labor begins. HOME CARE INSTRUCTIONS   Keep your caregiver's appointments. Follow your caregiver's instructions regarding medicine use, exercise, and diet.  During pregnancy, you are providing food for you and your baby. Continue to eat regular, well-balanced meals. Choose foods such as meat, fish, milk and other low fat dairy products, vegetables, fruits, and whole-grain breads and cereals. Your caregiver will tell you of the ideal weight gain.  A physical sexual relationship may be continued throughout pregnancy if there are no other problems such as early (premature) leaking of amniotic fluid from the membranes, vaginal bleeding, or belly (abdominal)  pain.  Exercise regularly if there are no restrictions. Check with your caregiver if you are unsure of the safety of your exercises. Greater weight gain will occur in the last 2 trimesters of pregnancy. Exercising helps:  Control your weight.  Get you in shape for labor and delivery.  You lose weight after you deliver.  Rest a lot with legs elevated, or as needed for leg cramps or low back pain.  Wear a good support or jogging bra for breast tenderness during pregnancy. This may help if worn during sleep. Pads   or tissues may be used in the bra if you are leaking colostrum.  Do not use hot tubs, steam rooms, or saunas.  Wear your seat belt when driving. This protects you and your baby if you are in an accident.  Avoid raw meat, cat litter boxes and soil used by cats. These carry germs that can cause birth defects in the baby.  It is easier to leak urine during pregnancy. Tightening up and strengthening the pelvic muscles will help with this problem. You can practice stopping your urination while you are going to the bathroom. These are the same muscles you need to strengthen. It is also the muscles you would use if you were trying to stop from passing gas. You can practice tightening these muscles up 10 times a set and repeating this about 3 times per day. Once you know what muscles to tighten up, do not perform these exercises during urination. It is more likely to cause an infection by backing up the urine.  Ask for help if you have financial, counseling, or nutritional needs during pregnancy. Your caregiver will be able to offer counseling for these needs as well as refer you for other special needs.  Make a list of emergency phone numbers and have them available.  Plan on getting help from family or friends when you go home from the hospital.  Make a trial run to the hospital.  Take prenatal classes with the father to understand, practice, and ask questions about the labor and  delivery.  Prepare the baby's room or nursery.  Do not travel out of the city unless it is absolutely necessary and with the advice of your caregiver.  Wear only low or no heal shoes to have better balance and prevent falling. MEDICINES AND DRUG USE IN PREGNANCY  Take prenatal vitamins as directed. The vitamin should contain 1 milligram of folic acid. Keep all vitamins out of reach of children. Only a couple vitamins or tablets containing iron may be fatal to a baby or young child when ingested.  Avoid use of all medicines, including herbs, over-the-counter medicines, not prescribed or suggested by your caregiver. Only take over-the-counter or prescription medicines for pain, discomfort, or fever as directed by your caregiver. Do not use aspirin, ibuprofen or naproxen unless approved by your caregiver.  Let your caregiver also know about herbs you may be using.  Alcohol is related to a number of birth defects. This includes fetal alcohol syndrome. All alcohol, in any form, should be avoided completely. Smoking will cause low birth rate and premature babies.  Illegal drugs are very harmful to the baby. They are absolutely forbidden. A baby born to an addicted mother will be addicted at birth. The baby will go through the same withdrawal an adult does. SEEK MEDICAL CARE IF: You have any concerns or worries during your pregnancy. It is better to call with your questions if you feel they cannot wait, rather than worry about them. SEEK IMMEDIATE MEDICAL CARE IF:   An unexplained oral temperature above 102 F (38.9 C) develops, or as your caregiver suggests.  You have leaking of fluid from the vagina. If leaking membranes are suspected, take your temperature and tell your caregiver of this when you call.  There is vaginal spotting, bleeding or passing clots. Tell your caregiver of the amount and how many pads are used.  You develop a bad smelling vaginal discharge with a change in the color  from clear to white.  You develop   vomiting that lasts more than 24 hours.  You develop chills or fever.  You develop shortness of breath.  You develop burning on urination.  You loose more than 2 pounds of weight or gain more than 2 pounds of weight or as suggested by your caregiver.  You notice sudden swelling of your face, hands, and feet or legs.  You develop belly (abdominal) pain. Round ligament discomfort is a common non-cancerous (benign) cause of abdominal pain in pregnancy. Your caregiver still must evaluate you.  You develop a severe headache that does not go away.  You develop visual problems, blurred or double vision.  If you have not felt your baby move for more than 1 hour. If you think the baby is not moving as much as usual, eat something with sugar in it and lie down on your left side for an hour. The baby should move at least 4 to 5 times per hour. Call right away if your baby moves less than that.  You fall, are in a car accident, or any kind of trauma.  There is mental or physical violence at home. Document Released: 03/14/2001 Document Revised: 12/13/2011 Document Reviewed: 09/16/2008 [employer] Patient Information 2014 [employer].  Breastfeeding A change in hormones during your pregnancy causes growth of your breast tissue and an increase in number and size of milk ducts. The hormone prolactin allows proteins, sugars, and fats from your blood supply to make breast milk in your milk-producing glands. The hormone progesterone prevents breast milk from being released before the birth of your baby. After the birth of your baby, your progesterone level decreases allowing breast milk to be released. Thoughts of your baby, as well as his or her sucking or crying, can stimulate the release of milk from the milk-producing glands. Deciding to breastfeed (nurse) is one of the best choices you can make for you and your baby. The information that follows gives a brief review  of the benefits, as well as other important skills to know about breastfeeding. BENEFITS OF BREASTFEEDING For your baby  The first milk (colostrum) helps your baby's digestive system function better.   There are antibodies in your milk that help your baby fight off infections.   Your baby has a lower incidence of asthma, allergies, and sudden infant death syndrome (SIDS).   The nutrients in breast milk are better for your baby than infant formulas.  Breast milk improves your baby's brain development.   Your baby will have less gas, colic, and constipation.  Your baby is less likely to develop other conditions, such as childhood obesity, asthma, or diabetes mellitus. For you  Breastfeeding helps develop a very special bond between you and your baby.   Breastfeeding is convenient, always available at the correct temperature, and costs nothing.   Breastfeeding helps to burn calories and helps you lose the weight gained during pregnancy.   Breastfeeding makes your uterus contract back down to normal size faster and slows bleeding following delivery.   Breastfeeding mothers have a lower risk of developing osteoporosis or breast or ovarian cancer later in life.  BREASTFEEDING FREQUENCY  A healthy, full-term baby may breastfeed as often as every hour or space his or her feedings to every 3 hours. Breastfeeding frequency will vary from baby to baby.   Newborns should be fed no less than every 2 3 hours during the day and every 4 5 hours during the night. You should breastfeed a minimum of 8 feedings in a 24 hour   period.  Awaken your baby to breastfeed if it has been 3 4 hours since the last feeding.  Breastfeed when you feel the need to reduce the fullness of your breasts or when your newborn shows signs of hunger. Signs that your baby may be hungry include:  Increased alertness or activity.  Stretching.  Movement of the head from side to side.  Movement of the head and  opening of the mouth when the corner of the mouth or cheek is stroked (rooting).  Increased sucking sounds, smacking lips, cooing, sighing, or squeaking.  Hand-to-mouth movements.  Increased sucking of fingers or hands.  Fussing.  Intermittent crying.  Signs of extreme hunger will require calming and consoling before you try to feed your baby. Signs of extreme hunger may include:  Restlessness.  A loud, strong cry.  Screaming.  Frequent feeding will help you make more milk and will help prevent problems, such as sore nipples and engorgement of the breasts.  BREASTFEEDING   Whether lying down or sitting, be sure that the baby's abdomen is facing your abdomen.   Support your breast with 4 fingers under your breast and your thumb above your nipple. Make sure your fingers are well away from your nipple and your baby's mouth.   Stroke your baby's lips gently with your finger or nipple.   When your baby's mouth is open wide enough, place all of your nipple and as much of the colored area around your nipple (areola) as possible into your baby's mouth.  More areola should be visible above his or her upper lip than below his or her lower lip.  Your baby's tongue should be between his or her lower gum and your breast.  Ensure that your baby's mouth is correctly positioned around the nipple (latched). Your baby's lips should create a seal on your breast.  Signs that your baby has effectively latched onto your nipple include:  Tugging or sucking without pain.  Swallowing heard between sucks.  Absent click or smacking sound.  Muscle movement above and in front of his or her ears with sucking.  Your baby must suck about 2 3 minutes in order to get your milk. Allow your baby to feed on each breast as long as he or she wants. Nurse your baby until he or she unlatches or falls asleep at the first breast, then offer the second breast.  Signs that your baby is full and satisfied  include:  A gradual decrease in the number of sucks or complete cessation of sucking.  Falling asleep.  Extension or relaxation of his or her body.  Retention of a small amount of milk in his or her mouth.  Letting go of your breast by himself or herself.  Signs of effective breastfeeding in you include:  Breasts that have increased firmness, weight, and size prior to feeding.  Breasts that are softer after nursing.  Increased milk volume, as well as a change in milk consistency and color by the 5th day of breastfeeding.  Breast fullness relieved by breastfeeding.  Nipples are not sore, cracked, or bleeding.  If needed, break the suction by putting your finger into the corner of your baby's mouth and sliding your finger between his or her gums. Then, remove your breast from his or her mouth.  It is common for babies to spit up a small amount after a feeding.  Babies often swallow air during feeding. This can make babies fussy. Burping your baby between breasts can help   with this.  Vitamin D supplements are recommended for babies who get only breast milk.  Avoid using a pacifier during your baby's first 4 6 weeks.  Avoid supplemental feedings of water, formula, or juice in place of breastfeeding. Breast milk is all the food your baby needs. It is not necessary for your baby to have water or formula. Your breasts will make more milk if supplemental feedings are avoided during the early weeks. HOW TO TELL WHETHER YOUR BABY IS GETTING ENOUGH BREAST MILK Wondering whether or not your baby is getting enough milk is a common concern among mothers. You can be assured that your baby is getting enough milk if:   Your baby is actively sucking and you hear swallowing.   Your baby seems relaxed and satisfied after a feeding.   Your baby nurses at least 8 12 times in a 24 hour time period.  During the first 3 5 days of age:  Your baby is wetting at least 3 5 diapers in a 24 hour  period. The urine should be clear and pale yellow.  Your baby is having at least 3 4 stools in a 24 hour period. The stool should be soft and yellow.  At 5 7 days of age, your baby is having at least 3 6 stools in a 24 hour period. The stool should be seedy and yellow by 5 days of age.  Your baby has a weight loss less than 7 10% during the first 3 days of age.  Your baby does not lose weight after 3 7 days of age.  Your baby gains 4 7 ounces each week after he or she is 4 days of age.  Your baby gains weight by 5 days of age and is back to birth weight within 2 weeks. ENGORGEMENT In the first week after your baby is born, you may experience extremely full breasts (engorgement). When engorged, your breasts may feel heavy, warm, or tender to the touch. Engorgement peaks within 24 48 hours after delivery of your baby.  Engorgement may be reduced by:  Continuing to breastfeed.  Increasing the frequency of breastfeeding.  Taking warm showers or applying warm, moist heat to your breasts just before each feeding. This increases circulation and helps the milk flow.   Gently massaging your breast before and during the feedings. With your fingertips, massage from your chest wall towards your nipple in a circular motion.   Ensuring that your baby empties at least one breast at every feeding. It also helps to start the next feeding on the opposite breast.   Expressing breast milk by hand or by using a breast pump to empty the breasts if your baby is sleepy, or not nursing well. You may also want to express milk if you are returning to work oryou feel you are getting engorged.  Ensuring your baby is latched on and positioned properly while breastfeeding. If you follow these suggestions, your engorgement should improve in 24 48 hours. If you are still experiencing difficulty, call your lactation consultant or caregiver.  CARING FOR YOURSELF Take care of your breasts.  Bathe or shower daily.    Avoid using soap on your nipples.   Wear a supportive bra. Avoid wearing underwire style bras.  Air dry your nipples for a 3 4minutes after each feeding.   Use only cotton bra pads to absorb breast milk leakage. Leaking of breast milk between feedings is normal.   Use only pure lanolin on your nipples after   nursing. You do not need to wash it off before feeding your baby again. Another option is to express a few drops of breast milk and gently massage that milk into your nipples.  Continue breast self-awareness checks. Take care of yourself.  Eat healthy foods. Alternate 3 meals with 3 snacks.  Avoid foods that you notice affect your baby in a bad way.  Drink milk, fruit juice, and water to satisfy your thirst (about 8 glasses a day).   Rest often, relax, and take your prenatal vitamins to prevent fatigue, stress, and anemia.  Avoid chewing and smoking tobacco.  Avoid alcohol and drug use.  Take over-the-counter and prescribed medicine only as directed by your caregiver or pharmacist. You should always check with your caregiver or pharmacist before taking any new medicine, vitamin, or herbal supplement.  Know that pregnancy is possible while breastfeeding. If desired, talk to your caregiver about family planning and safe birth control methods that may be used while breastfeeding. SEEK MEDICAL CARE IF:   You feel like you want to stop breastfeeding or have become frustrated with breastfeeding.  You have painful breasts or nipples.  Your nipples are cracked or bleeding.  Your breasts are red, tender, or warm.  You have a swollen area on either breast.  You have a fever or chills.  You have nausea or vomiting.  You have drainage from your nipples.  Your breasts do not become full before feedings by the 5th day after delivery.  You feel sad and depressed.  Your baby is too sleepy to eat well.  Your baby is having trouble sleeping.   Your baby is wetting  less than 3 diapers in a 24 hour period.  Your baby has less than 3 stools in a 24 hour period.  Your baby's skin or the white part of his or her eyes becomes more yellow.   Your baby is not gaining weight by 5 days of age. MAKE SURE YOU:   Understand these instructions.  Will watch your condition.  Will get help right away if you are not doing well or get worse. Document Released: 03/20/2005 Document Revised: 12/13/2011 Document Reviewed: 10/25/2011 [employer] Patient Information 2014 [employer].  

## 2012-09-30 NOTE — Progress Notes (Signed)
Pulse: 107 

## 2012-10-01 ENCOUNTER — Ambulatory Visit (HOSPITAL_COMMUNITY)
Admission: RE | Admit: 2012-10-01 | Discharge: 2012-10-01 | Disposition: A | Discharge status: Home / Self Care | Payer: Medicaid Other | Source: Ambulatory Visit | Attending: Obstetrics & Gynecology | Admitting: Obstetrics & Gynecology

## 2012-10-01 ENCOUNTER — Encounter: Payer: Self-pay | Admitting: *Deleted

## 2012-10-01 DIAGNOSIS — O99810 Abnormal glucose complicating pregnancy: Secondary | ICD-10-CM | POA: Insufficient documentation

## 2012-10-01 DIAGNOSIS — E669 Obesity, unspecified: Secondary | ICD-10-CM | POA: Insufficient documentation

## 2012-10-01 DIAGNOSIS — O09529 Supervision of elderly multigravida, unspecified trimester: Secondary | ICD-10-CM | POA: Insufficient documentation

## 2012-10-01 DIAGNOSIS — O99210 Obesity complicating pregnancy, unspecified trimester: Secondary | ICD-10-CM | POA: Insufficient documentation

## 2012-10-03 ENCOUNTER — Ambulatory Visit (INDEPENDENT_AMBULATORY_CARE_PROVIDER_SITE_OTHER): Payer: Medicaid Other | Admitting: *Deleted

## 2012-10-03 ENCOUNTER — Ambulatory Visit (HOSPITAL_COMMUNITY)
Admission: RE | Admit: 2012-10-03 | Discharge: 2012-10-03 | Disposition: A | Discharge status: Home / Self Care | Payer: Medicaid Other | Source: Ambulatory Visit | Attending: Obstetrics & Gynecology | Admitting: Obstetrics & Gynecology

## 2012-10-03 VITALS — BP 103/58

## 2012-10-03 DIAGNOSIS — O368390 Maternal care for abnormalities of the fetal heart rate or rhythm, unspecified trimester, not applicable or unspecified: Secondary | ICD-10-CM

## 2012-10-03 DIAGNOSIS — O09529 Supervision of elderly multigravida, unspecified trimester: Secondary | ICD-10-CM | POA: Insufficient documentation

## 2012-10-03 DIAGNOSIS — O409XX0 Polyhydramnios, unspecified trimester, not applicable or unspecified: Secondary | ICD-10-CM

## 2012-10-03 DIAGNOSIS — O403XX1 Polyhydramnios, third trimester, fetus 1: Secondary | ICD-10-CM

## 2012-10-03 DIAGNOSIS — O99810 Abnormal glucose complicating pregnancy: Secondary | ICD-10-CM | POA: Insufficient documentation

## 2012-10-03 DIAGNOSIS — O24419 Gestational diabetes mellitus in pregnancy, unspecified control: Secondary | ICD-10-CM

## 2012-10-03 DIAGNOSIS — E669 Obesity, unspecified: Secondary | ICD-10-CM | POA: Insufficient documentation

## 2012-10-03 NOTE — Progress Notes (Signed)
Patient here to see Ann Clark, RN, Diabetes [facility] for DM education only.  

## 2012-10-03 NOTE — Assessment & Plan Note (Signed)
EFW >90% @ 32 wks w/poly.  Twice weekly fetal testing per protocol.

## 2012-10-03 NOTE — Progress Notes (Signed)
NST performed today was reviewed and was found to be non-reactive.  BPP was 8/8.  Continue recommended antenatal testing and prenatal care.

## 2012-10-03 NOTE — Progress Notes (Signed)
P = 94  [place] growth done 7/1- EFW >90%, AFI = 23.68. Weekly AFI in radiology- next on 7/10.  NST results called to Dr. Macon Large and she reviewed tracing- BPP ordered for today.

## 2012-10-07 ENCOUNTER — Ambulatory Visit (INDEPENDENT_AMBULATORY_CARE_PROVIDER_SITE_OTHER): Payer: Medicaid Other | Admitting: Family Medicine

## 2012-10-07 VITALS — BP 124/70 | Temp 97.7°F | Wt 297.0 lb

## 2012-10-07 DIAGNOSIS — O99810 Abnormal glucose complicating pregnancy: Secondary | ICD-10-CM

## 2012-10-07 DIAGNOSIS — O24419 Gestational diabetes mellitus in pregnancy, unspecified control: Secondary | ICD-10-CM

## 2012-10-07 LAB — POCT URINALYSIS DIP (DEVICE)
Glucose, UA: 100 mg/dL — AB
Hgb urine dipstick: NEGATIVE
Specific Gravity, Urine: 1.02 (ref 1.005–1.030)
Urobilinogen, UA: 0.2 mg/dL (ref 0.0–1.0)

## 2012-10-07 NOTE — Progress Notes (Signed)
NST reviewed and reactive. FBS 83-97 2 hr pp 91-123--fasting for Ramadan 2 meals and 1 snack U/S for growth on 7/1 shows vtx, EFW 5 lb 10 oz > 90% and AC > 97%, AFI 23

## 2012-10-07 NOTE — Patient Instructions (Signed)
Gestational Diabetes Mellitus Gestational diabetes mellitus, often simply referred to as gestational diabetes, is a type of diabetes that some women develop during pregnancy. In gestational diabetes, the pancreas does not make enough insulin (a hormone), the cells are less responsive to the insulin that is made (insulin resistance), or both.Normally, insulin moves sugars from food into the tissue cells. The tissue cells use the sugars for energy. The lack of insulin or the lack of normal response to insulin causes excess sugars to build up in the blood instead of going into the tissue cells. As a result, high blood sugar (hyperglycemia) develops. The effect of high sugar (glucose) levels can cause many complications.  RISK FACTORS You have an increased chance of developing gestational diabetes if you have a family history of diabetes and also have one or more of the following risk factors:  A body mass index over 30 (obesity).  A previous pregnancy with gestational diabetes.  An older age at the time of pregnancy. If blood glucose levels are kept in the normal range during pregnancy, women can have a healthy pregnancy. If your blood glucose levels are not well controlled, there may be risks to you, your unborn baby (fetus), your labor and delivery, or your newborn baby.  SYMPTOMS  If symptoms are experienced, they are much like symptoms you would normally expect during pregnancy. The symptoms of gestational diabetes include:   Increased thirst (polydipsia).  Increased urination (polyuria).  Increased urination during the night (nocturia).  Weight loss. This weight loss may be rapid.  Frequent, recurring infections.  Tiredness (fatigue).  Weakness.  Vision changes, such as blurred vision.  Fruity smell to your breath.  Abdominal pain. DIAGNOSIS Diabetes is diagnosed when blood glucose levels are increased. Your blood glucose level may be checked by one or more of the following  blood tests:  A fasting blood glucose test. You will not be allowed to eat for at least 8 hours before a blood sample is taken.  A random blood glucose test. Your blood glucose is checked at any time of the day regardless of when you ate.  A hemoglobin A1c blood glucose test. A hemoglobin A1c test provides information about blood glucose control over the previous 3 months.  An oral glucose tolerance test (OGTT). Your blood glucose is measured after you have not eaten (fasted) for 1 3 hours and then after you drink a glucose-containing beverage. Since the hormones that cause insulin resistance are highest at about 24 28 weeks of a pregnancy, an OGTT is usually performed during that time. If you have risk factors for gestational diabetes, your caregiver may test you for gestational diabetes earlier than 24 weeks of pregnancy. TREATMENT   You will need to take diabetes medicine or insulin daily to keep blood glucose levels in the desired range.  You will need to match insulin dosing with exercise and healthy food choices. The treatment goal is to maintain the before meal (preprandial), bedtime, and overnight blood glucose level at 60 99 mg/dL during pregnancy. The treatment goal is to further maintain peak after meal blood sugar (postprandial glucose) level at 100 140 mg/dL.  HOME CARE INSTRUCTIONS   Have your hemoglobin A1c level checked twice a year.  Perform daily blood glucose monitoring as directed by your caregiver. It is common to perform frequent blood glucose monitoring.  Monitor urine ketones when you are ill and as directed by your caregiver.  Take your diabetes medicine and insulin as directed by your caregiver to   maintain your blood glucose level in the desired range.  Never run out of diabetes medicine or insulin. It is needed every day.  Adjust insulin based on your intake of carbohydrates. Carbohydrates can raise blood glucose levels but need to be included in your diet.  Carbohydrates provide vitamins, minerals, and fiber which are an essential part of a healthy diet. Carbohydrates are found in fruits, vegetables, whole grains, dairy products, legumes, and foods containing added sugars.    Eat healthy foods. Alternate 3 meals with 3 snacks.  Maintain a healthy weight gain. The usual total expected weight gain varies according to your prepregnancy body mass index (BMI).  Carry a medical alert card or wear your medical alert jewelry.  Carry a 15 gram carbohydrate snack with you at all times to treat low blood glucose (hypoglycemia). Some examples of 15 gram carbohydrate snacks include:  Glucose tablets, 3 or 4   Glucose gel, 15 gram tube  Raisins, 2 tablespoons (24 g)  Jelly beans, 6  Animal crackers, 8  Fruit juice, regular soda, or low fat milk, 4 ounces (120 mL)  Gummy treats, 9    Recognize hypoglycemia. Hypoglycemia during pregnancy occurs with blood glucose levels of 60 mg/dL and below. The risk for hypoglycemia increases when fasting or skipping meals, during or after intense exercise, and during sleep. Hypoglycemia symptoms can include:  Tremors or shakes.  Decreased ability to concentrate.  Sweating.  Increased heart rate.  Headache.  Dry mouth.  Hunger.  Irritability.  Anxiety.  Restless sleep.  Altered speech or coordination.  Confusion.  Treat hypoglycemia promptly. If you are alert and able to safely swallow, follow the 15:15 rule:  Take 15 20 grams of rapid-acting glucose or carbohydrate. Rapid-acting options include glucose gel, glucose tablets, or 4 ounces (120 mL) of fruit juice, regular soda, or low fat milk.  Check your blood glucose level 15 minutes after taking the glucose.   Take 15 20 grams more of glucose if the repeat blood glucose level is still 70 mg/dL or below.  Eat a meal or snack within 1 hour once blood glucose levels return to normal.  Be alert to polyuria and polydipsia which are early  signs of hyperglycemia. An early awareness of hyperglycemia allows for prompt treatment. Treat hyperglycemia as directed by your caregiver.  Engage in at least 30 minutes of physical activity a day or as directed by your caregiver. Ten minutes of physical activity timed 30 minutes after each meal is encouraged to control postprandial blood glucose levels.  Adjust your insulin dosing and food intake as needed if you start a new exercise or sport.  Follow your sick day plan at any time you are unable to eat or drink as usual.  Avoid tobacco and alcohol use.  Follow up with your caregiver regularly.  Follow the advice of your caregiver regarding your prenatal and post-delivery (postpartum) appointments, meal planning, exercise, medicines, vitamins, blood tests, other medical tests, and physical activities.  Perform daily skin and foot care. Examine your skin and feet daily for cuts, bruises, redness, nail problems, bleeding, blisters, or sores.  Brush your teeth and gums at least twice a day and floss at least once a day. Follow up with your dentist regularly.  Schedule an eye exam during the first trimester of your pregnancy or as directed by your caregiver.  Share your diabetes management plan with your workplace or school.  Stay up-to-date with immunizations.  Learn to manage stress.  Obtain ongoing diabetes education   and support as needed. SEEK MEDICAL CARE IF:   You are unable to eat food or drink fluids for more than 6 hours.  You have nausea and vomiting for more than 6 hours.  You have a blood glucose level of 200 mg/dL and you have ketones in your urine.  There is a change in mental status.  You develop vision problems.  You have a persistent headache.  You have upper abdominal pain or discomfort.  You develop an additional serious illness.  You have diarrhea for more than 6 hours.  You have been sick or have had a fever for a couple of days and are not getting  better. SEEK IMMEDIATE MEDICAL CARE IF:   You have difficulty breathing.  You no longer feel the baby moving.  You are bleeding or have discharge from your vagina.  You start having premature contractions or labor. MAKE SURE YOU:  Understand these instructions.  Will watch your condition.  Will get help right away if you are not doing well or get worse. Document Released: 06/26/2000 Document Revised: 12/13/2011 Document Reviewed: 10/17/2011 [employer] Patient Information 2014 [employer].  Breastfeeding A change in hormones during your pregnancy causes growth of your breast tissue and an increase in number and size of milk ducts. The hormone prolactin allows proteins, sugars, and fats from your blood supply to make breast milk in your milk-producing glands. The hormone progesterone prevents breast milk from being released before the birth of your baby. After the birth of your baby, your progesterone level decreases allowing breast milk to be released. Thoughts of your baby, as well as his or her sucking or crying, can stimulate the release of milk from the milk-producing glands. Deciding to breastfeed (nurse) is one of the best choices you can make for you and your baby. The information that follows gives a brief review of the benefits, as well as other important skills to know about breastfeeding. BENEFITS OF BREASTFEEDING For your baby  The first milk (colostrum) helps your baby's digestive system function better.   There are antibodies in your milk that help your baby fight off infections.   Your baby has a lower incidence of asthma, allergies, and sudden infant death syndrome (SIDS).   The nutrients in breast milk are better for your baby than infant formulas.  Breast milk improves your baby's brain development.   Your baby will have less gas, colic, and constipation.  Your baby is less likely to develop other conditions, such as childhood obesity, asthma, or diabetes  mellitus. For you  Breastfeeding helps develop a very special bond between you and your baby.   Breastfeeding is convenient, always available at the correct temperature, and costs nothing.   Breastfeeding helps to burn calories and helps you lose the weight gained during pregnancy.   Breastfeeding makes your uterus contract back down to normal size faster and slows bleeding following delivery.   Breastfeeding mothers have a lower risk of developing osteoporosis or breast or ovarian cancer later in life.  BREASTFEEDING FREQUENCY  A healthy, full-term baby may breastfeed as often as every hour or space his or her feedings to every 3 hours. Breastfeeding frequency will vary from baby to baby.   Newborns should be fed no less than every 2 3 hours during the day and every 4 5 hours during the night. You should breastfeed a minimum of 8 feedings in a 24 hour period.  Awaken your baby to breastfeed if it has been 3 4   hours since the last feeding.  Breastfeed when you feel the need to reduce the fullness of your breasts or when your newborn shows signs of hunger. Signs that your baby may be hungry include:  Increased alertness or activity.  Stretching.  Movement of the head from side to side.  Movement of the head and opening of the mouth when the corner of the mouth or cheek is stroked (rooting).  Increased sucking sounds, smacking lips, cooing, sighing, or squeaking.  Hand-to-mouth movements.  Increased sucking of fingers or hands.  Fussing.  Intermittent crying.  Signs of extreme hunger will require calming and consoling before you try to feed your baby. Signs of extreme hunger may include:  Restlessness.  A loud, strong cry.  Screaming.  Frequent feeding will help you make more milk and will help prevent problems, such as sore nipples and engorgement of the breasts.  BREASTFEEDING   Whether lying down or sitting, be sure that the baby's abdomen is facing your  abdomen.   Support your breast with 4 fingers under your breast and your thumb above your nipple. Make sure your fingers are well away from your nipple and your baby's mouth.   Stroke your baby's lips gently with your finger or nipple.   When your baby's mouth is open wide enough, place all of your nipple and as much of the colored area around your nipple (areola) as possible into your baby's mouth.  More areola should be visible above his or her upper lip than below his or her lower lip.  Your baby's tongue should be between his or her lower gum and your breast.  Ensure that your baby's mouth is correctly positioned around the nipple (latched). Your baby's lips should create a seal on your breast.  Signs that your baby has effectively latched onto your nipple include:  Tugging or sucking without pain.  Swallowing heard between sucks.  Absent click or smacking sound.  Muscle movement above and in front of his or her ears with sucking.  Your baby must suck about 2 3 minutes in order to get your milk. Allow your baby to feed on each breast as long as he or she wants. Nurse your baby until he or she unlatches or falls asleep at the first breast, then offer the second breast.  Signs that your baby is full and satisfied include:  A gradual decrease in the number of sucks or complete cessation of sucking.  Falling asleep.  Extension or relaxation of his or her body.  Retention of a small amount of milk in his or her mouth.  Letting go of your breast by himself or herself.  Signs of effective breastfeeding in you include:  Breasts that have increased firmness, weight, and size prior to feeding.  Breasts that are softer after nursing.  Increased milk volume, as well as a change in milk consistency and color by the 5th day of breastfeeding.  Breast fullness relieved by breastfeeding.  Nipples are not sore, cracked, or bleeding.  If needed, break the suction by putting your  finger into the corner of your baby's mouth and sliding your finger between his or her gums. Then, remove your breast from his or her mouth.  It is common for babies to spit up a small amount after a feeding.  Babies often swallow air during feeding. This can make babies fussy. Burping your baby between breasts can help with this.  Vitamin D supplements are recommended for babies who get only   breast milk.  Avoid using a pacifier during your baby's first 4 6 weeks.  Avoid supplemental feedings of water, formula, or juice in place of breastfeeding. Breast milk is all the food your baby needs. It is not necessary for your baby to have water or formula. Your breasts will make more milk if supplemental feedings are avoided during the early weeks. HOW TO TELL WHETHER YOUR BABY IS GETTING ENOUGH BREAST MILK Wondering whether or not your baby is getting enough milk is a common concern among mothers. You can be assured that your baby is getting enough milk if:   Your baby is actively sucking and you hear swallowing.   Your baby seems relaxed and satisfied after a feeding.   Your baby nurses at least 8 12 times in a 24 hour time period.  During the first 3 5 days of age:  Your baby is wetting at least 3 5 diapers in a 24 hour period. The urine should be clear and pale yellow.  Your baby is having at least 3 4 stools in a 24 hour period. The stool should be soft and yellow.  At 5 7 days of age, your baby is having at least 3 6 stools in a 24 hour period. The stool should be seedy and yellow by 5 days of age.  Your baby has a weight loss less than 7 10% during the first 3 days of age.  Your baby does not lose weight after 3 7 days of age.  Your baby gains 4 7 ounces each week after he or she is 4 days of age.  Your baby gains weight by 5 days of age and is back to birth weight within 2 weeks. ENGORGEMENT In the first week after your baby is born, you may experience extremely full breasts  (engorgement). When engorged, your breasts may feel heavy, warm, or tender to the touch. Engorgement peaks within 24 48 hours after delivery of your baby.  Engorgement may be reduced by:  Continuing to breastfeed.  Increasing the frequency of breastfeeding.  Taking warm showers or applying warm, moist heat to your breasts just before each feeding. This increases circulation and helps the milk flow.   Gently massaging your breast before and during the feedings. With your fingertips, massage from your chest wall towards your nipple in a circular motion.   Ensuring that your baby empties at least one breast at every feeding. It also helps to start the next feeding on the opposite breast.   Expressing breast milk by hand or by using a breast pump to empty the breasts if your baby is sleepy, or not nursing well. You may also want to express milk if you are returning to work oryou feel you are getting engorged.  Ensuring your baby is latched on and positioned properly while breastfeeding. If you follow these suggestions, your engorgement should improve in 24 48 hours. If you are still experiencing difficulty, call your lactation consultant or caregiver.  CARING FOR YOURSELF Take care of your breasts.  Bathe or shower daily.   Avoid using soap on your nipples.   Wear a supportive bra. Avoid wearing underwire style bras.  Air dry your nipples for a 3 4minutes after each feeding.   Use only cotton bra pads to absorb breast milk leakage. Leaking of breast milk between feedings is normal.   Use only pure lanolin on your nipples after nursing. You do not need to wash it off before feeding your baby again.   Another option is to express a few drops of breast milk and gently massage that milk into your nipples.  Continue breast self-awareness checks. Take care of yourself.  Eat healthy foods. Alternate 3 meals with 3 snacks.  Avoid foods that you notice affect your baby in a bad  way.  Drink milk, fruit juice, and water to satisfy your thirst (about 8 glasses a day).   Rest often, relax, and take your prenatal vitamins to prevent fatigue, stress, and anemia.  Avoid chewing and smoking tobacco.  Avoid alcohol and drug use.  Take over-the-counter and prescribed medicine only as directed by your caregiver or pharmacist. You should always check with your caregiver or pharmacist before taking any new medicine, vitamin, or herbal supplement.  Know that pregnancy is possible while breastfeeding. If desired, talk to your caregiver about family planning and safe birth control methods that may be used while breastfeeding. SEEK MEDICAL CARE IF:   You feel like you want to stop breastfeeding or have become frustrated with breastfeeding.  You have painful breasts or nipples.  Your nipples are cracked or bleeding.  Your breasts are red, tender, or warm.  You have a swollen area on either breast.  You have a fever or chills.  You have nausea or vomiting.  You have drainage from your nipples.  Your breasts do not become full before feedings by the 5th day after delivery.  You feel sad and depressed.  Your baby is too sleepy to eat well.  Your baby is having trouble sleeping.   Your baby is wetting less than 3 diapers in a 24 hour period.  Your baby has less than 3 stools in a 24 hour period.  Your baby's skin or the white part of his or her eyes becomes more yellow.   Your baby is not gaining weight by 5 days of age. MAKE SURE YOU:   Understand these instructions.  Will watch your condition.  Will get help right away if you are not doing well or get worse. Document Released: 03/20/2005 Document Revised: 12/13/2011 Document Reviewed: 10/25/2011 [employer] Patient Information 2014 [employer].  

## 2012-10-07 NOTE — Progress Notes (Signed)
Weekly AFI in radiology - next on 7/10.  Growth US done 7/1.

## 2012-10-07 NOTE — Progress Notes (Signed)
Pulse: 99 

## 2012-10-10 ENCOUNTER — Ambulatory Visit (HOSPITAL_COMMUNITY)
Admission: RE | Admit: 2012-10-10 | Discharge: 2012-10-10 | Disposition: A | Discharge status: Home / Self Care | Payer: Medicaid Other | Source: Ambulatory Visit | Attending: Obstetrics & Gynecology | Admitting: Obstetrics & Gynecology

## 2012-10-10 ENCOUNTER — Ambulatory Visit (INDEPENDENT_AMBULATORY_CARE_PROVIDER_SITE_OTHER): Payer: Medicaid Other | Admitting: *Deleted

## 2012-10-10 VITALS — BP 118/61

## 2012-10-10 DIAGNOSIS — O99810 Abnormal glucose complicating pregnancy: Secondary | ICD-10-CM

## 2012-10-10 DIAGNOSIS — O09529 Supervision of elderly multigravida, unspecified trimester: Secondary | ICD-10-CM | POA: Insufficient documentation

## 2012-10-10 DIAGNOSIS — E669 Obesity, unspecified: Secondary | ICD-10-CM | POA: Insufficient documentation

## 2012-10-10 NOTE — Progress Notes (Signed)
P = 94   AFI today in radiology =

## 2012-10-10 NOTE — Progress Notes (Signed)
NST performed today was reviewed and was found to be reactive.  AFI done in radiology and was normal at 21.15 cm. Frank breech on ultrasound today, will monitor presentation. Continue recommended antenatal testing and prenatal care.

## 2012-10-14 ENCOUNTER — Ambulatory Visit (INDEPENDENT_AMBULATORY_CARE_PROVIDER_SITE_OTHER): Payer: Medicaid Other | Admitting: Obstetrics & Gynecology

## 2012-10-14 VITALS — BP 112/58 | Wt 294.9 lb

## 2012-10-14 DIAGNOSIS — O09523 Supervision of elderly multigravida, third trimester: Secondary | ICD-10-CM

## 2012-10-14 DIAGNOSIS — O3663X0 Maternal care for excessive fetal growth, third trimester, not applicable or unspecified: Secondary | ICD-10-CM | POA: Insufficient documentation

## 2012-10-14 DIAGNOSIS — O34219 Maternal care for unspecified type scar from previous cesarean delivery: Secondary | ICD-10-CM

## 2012-10-14 DIAGNOSIS — O3660X0 Maternal care for excessive fetal growth, unspecified trimester, not applicable or unspecified: Secondary | ICD-10-CM

## 2012-10-14 DIAGNOSIS — O403XX1 Polyhydramnios, third trimester, fetus 1: Secondary | ICD-10-CM

## 2012-10-14 DIAGNOSIS — O09529 Supervision of elderly multigravida, unspecified trimester: Secondary | ICD-10-CM

## 2012-10-14 DIAGNOSIS — O409XX0 Polyhydramnios, unspecified trimester, not applicable or unspecified: Secondary | ICD-10-CM

## 2012-10-14 DIAGNOSIS — O24419 Gestational diabetes mellitus in pregnancy, unspecified control: Secondary | ICD-10-CM

## 2012-10-14 DIAGNOSIS — O99810 Abnormal glucose complicating pregnancy: Secondary | ICD-10-CM

## 2012-10-14 LAB — POCT URINALYSIS DIP (DEVICE)
Bilirubin Urine: NEGATIVE
Glucose, UA: NEGATIVE mg/dL
Hgb urine dipstick: NEGATIVE
Ketones, ur: NEGATIVE mg/dL
Specific Gravity, Urine: 1.015 (ref 1.005–1.030)
pH: 6.5 (ref 5.0–8.0)

## 2012-10-14 NOTE — Patient Instructions (Addendum)
Return to clinic for any obstetric concerns or go to [facility] for evaluation  

## 2012-10-14 NOTE — Progress Notes (Signed)
P = 94     Pt has no c/o today- checking CBG 4x daily.  States she is only taking metformin- thought this was ordered as replacement for glyburide.  Pt still observing Ramadan- eats @ 0430 and 2030 daily.  Continue weekly AFI in radiology on Thursdays- next scheduled 7/17.

## 2012-10-14 NOTE — Progress Notes (Signed)
NST performed today was reviewed and was found to be reactive.  Continue recommended antenatal testing and prenatal care.  CBgs within range, continue Glyburide and Metformin as prescribed.  No other complaints or concerns.  Fetal movement and labor precautions reviewed.

## 2012-10-17 ENCOUNTER — Ambulatory Visit (INDEPENDENT_AMBULATORY_CARE_PROVIDER_SITE_OTHER): Payer: Medicaid Other | Admitting: *Deleted

## 2012-10-17 ENCOUNTER — Ambulatory Visit (HOSPITAL_COMMUNITY)
Admission: RE | Admit: 2012-10-17 | Discharge: 2012-10-17 | Disposition: A | Discharge status: Home / Self Care | Payer: Medicaid Other | Source: Ambulatory Visit | Attending: Obstetrics & Gynecology | Admitting: Obstetrics & Gynecology

## 2012-10-17 VITALS — BP 122/67

## 2012-10-17 DIAGNOSIS — O99810 Abnormal glucose complicating pregnancy: Secondary | ICD-10-CM | POA: Insufficient documentation

## 2012-10-17 DIAGNOSIS — O24419 Gestational diabetes mellitus in pregnancy, unspecified control: Secondary | ICD-10-CM

## 2012-10-17 DIAGNOSIS — O409XX0 Polyhydramnios, unspecified trimester, not applicable or unspecified: Secondary | ICD-10-CM

## 2012-10-17 DIAGNOSIS — O09529 Supervision of elderly multigravida, unspecified trimester: Secondary | ICD-10-CM | POA: Insufficient documentation

## 2012-10-17 DIAGNOSIS — O3660X0 Maternal care for excessive fetal growth, unspecified trimester, not applicable or unspecified: Secondary | ICD-10-CM

## 2012-10-17 DIAGNOSIS — E669 Obesity, unspecified: Secondary | ICD-10-CM | POA: Insufficient documentation

## 2012-10-17 DIAGNOSIS — O403XX1 Polyhydramnios, third trimester, fetus 1: Secondary | ICD-10-CM

## 2012-10-17 DIAGNOSIS — O3663X1 Maternal care for excessive fetal growth, third trimester, fetus 1: Secondary | ICD-10-CM

## 2012-10-17 NOTE — Progress Notes (Addendum)
NST performed today was reviewed and was found to be reactive.  Continue recommended antenatal testing and prenatal care. AFI was 24.4 cm today, done in Radiology.   Patient was only taking Metformin because of Ramadan, will continue this for now given that her CBGs are within range.  May need to add Glyburide when Ramadan is over.

## 2012-10-17 NOTE — Progress Notes (Signed)
P = 97    Pt continues to take only metformin as she is observing Ramadan and only having 2 meals daily- @ 0430 and 2030. Pt states she is aware of UC's today but they are not painful. She was offered cx exam after consult w/Dr. Macon Large and declined.  Labor sx reviewed.  Pt has [place] for AFI today.

## 2012-10-21 ENCOUNTER — Encounter: Payer: Self-pay | Admitting: Obstetrics and Gynecology

## 2012-10-21 ENCOUNTER — Ambulatory Visit (INDEPENDENT_AMBULATORY_CARE_PROVIDER_SITE_OTHER): Payer: Medicaid Other | Admitting: Obstetrics and Gynecology

## 2012-10-21 VITALS — BP 122/79 | Temp 97.1°F | Wt 291.0 lb

## 2012-10-21 DIAGNOSIS — O99810 Abnormal glucose complicating pregnancy: Secondary | ICD-10-CM

## 2012-10-21 DIAGNOSIS — O0930 Supervision of pregnancy with insufficient antenatal care, unspecified trimester: Secondary | ICD-10-CM

## 2012-10-21 DIAGNOSIS — O3663X0 Maternal care for excessive fetal growth, third trimester, not applicable or unspecified: Secondary | ICD-10-CM

## 2012-10-21 DIAGNOSIS — O99213 Obesity complicating pregnancy, third trimester: Secondary | ICD-10-CM

## 2012-10-21 DIAGNOSIS — O34219 Maternal care for unspecified type scar from previous cesarean delivery: Secondary | ICD-10-CM

## 2012-10-21 DIAGNOSIS — E669 Obesity, unspecified: Secondary | ICD-10-CM

## 2012-10-21 DIAGNOSIS — O09523 Supervision of elderly multigravida, third trimester: Secondary | ICD-10-CM

## 2012-10-21 DIAGNOSIS — O403XX1 Polyhydramnios, third trimester, fetus 1: Secondary | ICD-10-CM

## 2012-10-21 DIAGNOSIS — O09299 Supervision of pregnancy with other poor reproductive or obstetric history, unspecified trimester: Secondary | ICD-10-CM

## 2012-10-21 DIAGNOSIS — O24419 Gestational diabetes mellitus in pregnancy, unspecified control: Secondary | ICD-10-CM

## 2012-10-21 DIAGNOSIS — O3421 Maternal care for scar from previous cesarean delivery: Secondary | ICD-10-CM

## 2012-10-21 DIAGNOSIS — O3660X0 Maternal care for excessive fetal growth, unspecified trimester, not applicable or unspecified: Secondary | ICD-10-CM

## 2012-10-21 DIAGNOSIS — O0933 Supervision of pregnancy with insufficient antenatal care, third trimester: Secondary | ICD-10-CM

## 2012-10-21 DIAGNOSIS — O409XX0 Polyhydramnios, unspecified trimester, not applicable or unspecified: Secondary | ICD-10-CM

## 2012-10-21 DIAGNOSIS — Z8632 Personal history of gestational diabetes: Secondary | ICD-10-CM

## 2012-10-21 DIAGNOSIS — O09529 Supervision of elderly multigravida, unspecified trimester: Secondary | ICD-10-CM

## 2012-10-21 LAB — POCT URINALYSIS DIP (DEVICE)
Bilirubin Urine: NEGATIVE
Glucose, UA: NEGATIVE mg/dL
Leukocytes, UA: NEGATIVE
Nitrite: NEGATIVE

## 2012-10-21 NOTE — Progress Notes (Signed)
Pulse: 97

## 2012-10-21 NOTE — Progress Notes (Signed)
[place] growth scheduled on 7/24.

## 2012-10-21 NOTE — Progress Notes (Signed)
NST reviewed and reactive. Patient doing well without complaints. FM/PTL precautions reviewed. CBGs all within range with Metformin and Glyburide. Cultures next visit. Growth ultrasound at 38 weeks

## 2012-10-24 ENCOUNTER — Ambulatory Visit (HOSPITAL_COMMUNITY)
Admission: RE | Admit: 2012-10-24 | Discharge: 2012-10-24 | Disposition: A | Discharge status: Home / Self Care | Payer: Medicaid Other | Source: Ambulatory Visit | Attending: Obstetrics & Gynecology | Admitting: Obstetrics & Gynecology

## 2012-10-24 ENCOUNTER — Ambulatory Visit (INDEPENDENT_AMBULATORY_CARE_PROVIDER_SITE_OTHER): Payer: Medicaid Other | Admitting: *Deleted

## 2012-10-24 VITALS — BP 92/60

## 2012-10-24 DIAGNOSIS — O99810 Abnormal glucose complicating pregnancy: Secondary | ICD-10-CM | POA: Insufficient documentation

## 2012-10-24 DIAGNOSIS — O3660X0 Maternal care for excessive fetal growth, unspecified trimester, not applicable or unspecified: Secondary | ICD-10-CM

## 2012-10-24 DIAGNOSIS — O24419 Gestational diabetes mellitus in pregnancy, unspecified control: Secondary | ICD-10-CM

## 2012-10-24 DIAGNOSIS — E669 Obesity, unspecified: Secondary | ICD-10-CM | POA: Insufficient documentation

## 2012-10-24 DIAGNOSIS — O3663X0 Maternal care for excessive fetal growth, third trimester, not applicable or unspecified: Secondary | ICD-10-CM

## 2012-10-24 DIAGNOSIS — O09529 Supervision of elderly multigravida, unspecified trimester: Secondary | ICD-10-CM | POA: Insufficient documentation

## 2012-10-24 NOTE — Progress Notes (Signed)
P-96 

## 2012-10-24 NOTE — Progress Notes (Signed)
NST performed today was reviewed and was found to be reactive.  Continue recommended antenatal testing and prenatal care.  

## 2012-10-28 ENCOUNTER — Other Ambulatory Visit: Payer: Medicaid Other

## 2012-10-31 ENCOUNTER — Ambulatory Visit (INDEPENDENT_AMBULATORY_CARE_PROVIDER_SITE_OTHER): Payer: Medicaid Other | Admitting: *Deleted

## 2012-10-31 ENCOUNTER — Ambulatory Visit (HOSPITAL_COMMUNITY)
Admission: RE | Admit: 2012-10-31 | Discharge: 2012-10-31 | Disposition: A | Discharge status: Home / Self Care | Payer: Medicaid Other | Source: Ambulatory Visit | Attending: Obstetrics & Gynecology | Admitting: Obstetrics & Gynecology

## 2012-10-31 VITALS — BP 101/56

## 2012-10-31 DIAGNOSIS — O3663X1 Maternal care for excessive fetal growth, third trimester, fetus 1: Secondary | ICD-10-CM

## 2012-10-31 DIAGNOSIS — E669 Obesity, unspecified: Secondary | ICD-10-CM | POA: Insufficient documentation

## 2012-10-31 DIAGNOSIS — O99810 Abnormal glucose complicating pregnancy: Secondary | ICD-10-CM | POA: Insufficient documentation

## 2012-10-31 DIAGNOSIS — O409XX0 Polyhydramnios, unspecified trimester, not applicable or unspecified: Secondary | ICD-10-CM | POA: Insufficient documentation

## 2012-10-31 DIAGNOSIS — O403XX1 Polyhydramnios, third trimester, fetus 1: Secondary | ICD-10-CM

## 2012-10-31 DIAGNOSIS — O24419 Gestational diabetes mellitus in pregnancy, unspecified control: Secondary | ICD-10-CM

## 2012-10-31 NOTE — Progress Notes (Signed)
P = 93   Weekly AFI @ radiology, done today, AFI = 20.19

## 2012-10-31 NOTE — Progress Notes (Signed)
NST performed today was reviewed and was found to be reactive.  Continue recommended antenatal testing and prenatal care.  

## 2012-11-04 ENCOUNTER — Ambulatory Visit (INDEPENDENT_AMBULATORY_CARE_PROVIDER_SITE_OTHER): Payer: Medicaid Other | Admitting: Obstetrics & Gynecology

## 2012-11-04 VITALS — BP 119/78 | Temp 98.0°F | Wt 299.5 lb

## 2012-11-04 DIAGNOSIS — O24419 Gestational diabetes mellitus in pregnancy, unspecified control: Secondary | ICD-10-CM

## 2012-11-04 DIAGNOSIS — O99810 Abnormal glucose complicating pregnancy: Secondary | ICD-10-CM

## 2012-11-04 DIAGNOSIS — O409XX0 Polyhydramnios, unspecified trimester, not applicable or unspecified: Secondary | ICD-10-CM

## 2012-11-04 DIAGNOSIS — O403XX1 Polyhydramnios, third trimester, fetus 1: Secondary | ICD-10-CM

## 2012-11-04 LAB — FETAL NONSTRESS TEST

## 2012-11-04 LAB — POCT URINALYSIS DIP (DEVICE)
Bilirubin Urine: NEGATIVE
Ketones, ur: NEGATIVE mg/dL
Leukocytes, UA: NEGATIVE
Nitrite: NEGATIVE
Protein, ur: NEGATIVE mg/dL

## 2012-11-04 NOTE — Progress Notes (Signed)
Weekly AFI in radiology - next on 8/7.  Follow up growth scheduled on 8/14.

## 2012-11-04 NOTE — Addendum Note (Signed)
Addended by: Franchot Mimes on: 11/04/2012 11:57 AM   Modules accepted: Orders

## 2012-11-04 NOTE — Progress Notes (Signed)
Pulse 110 Having trouble with constipation.

## 2012-11-04 NOTE — Progress Notes (Signed)
GBS adn cultures today.  fastings--78-91; 2 hr pp Breakfast --105-124;   Lunch 118-124; dinner  98-126.  Pt to have rpt

## 2012-11-05 LAB — GC/CHLAMYDIA PROBE AMP
CT Probe RNA: NEGATIVE
GC Probe RNA: NEGATIVE

## 2012-11-07 ENCOUNTER — Ambulatory Visit (HOSPITAL_COMMUNITY)
Admission: RE | Admit: 2012-11-07 | Discharge: 2012-11-07 | Disposition: A | Discharge status: Home / Self Care | Payer: Medicaid Other | Source: Ambulatory Visit | Attending: Obstetrics & Gynecology | Admitting: Obstetrics & Gynecology

## 2012-11-07 ENCOUNTER — Ambulatory Visit (INDEPENDENT_AMBULATORY_CARE_PROVIDER_SITE_OTHER): Payer: Medicaid Other | Admitting: General Practice

## 2012-11-07 VITALS — BP 124/70

## 2012-11-07 DIAGNOSIS — O09529 Supervision of elderly multigravida, unspecified trimester: Secondary | ICD-10-CM | POA: Insufficient documentation

## 2012-11-07 DIAGNOSIS — O99810 Abnormal glucose complicating pregnancy: Secondary | ICD-10-CM

## 2012-11-07 DIAGNOSIS — O99210 Obesity complicating pregnancy, unspecified trimester: Secondary | ICD-10-CM | POA: Insufficient documentation

## 2012-11-07 DIAGNOSIS — E669 Obesity, unspecified: Secondary | ICD-10-CM | POA: Insufficient documentation

## 2012-11-07 DIAGNOSIS — O409XX0 Polyhydramnios, unspecified trimester, not applicable or unspecified: Secondary | ICD-10-CM | POA: Insufficient documentation

## 2012-11-07 NOTE — Progress Notes (Signed)
Pulse: 97

## 2012-11-08 NOTE — Progress Notes (Signed)
Polyhydramnios on [place]. NST reactive 11/07/12

## 2012-11-09 LAB — CULTURE, BETA STREP (GROUP B ONLY)

## 2012-11-10 ENCOUNTER — Encounter: Payer: Self-pay | Admitting: Obstetrics & Gynecology

## 2012-11-11 ENCOUNTER — Ambulatory Visit (INDEPENDENT_AMBULATORY_CARE_PROVIDER_SITE_OTHER): Payer: Medicaid Other | Admitting: Obstetrics and Gynecology

## 2012-11-11 VITALS — BP 131/82 | Temp 97.4°F | Wt 301.5 lb

## 2012-11-11 DIAGNOSIS — O99213 Obesity complicating pregnancy, third trimester: Secondary | ICD-10-CM

## 2012-11-11 DIAGNOSIS — O99810 Abnormal glucose complicating pregnancy: Secondary | ICD-10-CM

## 2012-11-11 DIAGNOSIS — O3421 Maternal care for scar from previous cesarean delivery: Secondary | ICD-10-CM

## 2012-11-11 DIAGNOSIS — O409XX0 Polyhydramnios, unspecified trimester, not applicable or unspecified: Secondary | ICD-10-CM

## 2012-11-11 DIAGNOSIS — O34219 Maternal care for unspecified type scar from previous cesarean delivery: Secondary | ICD-10-CM

## 2012-11-11 DIAGNOSIS — O24419 Gestational diabetes mellitus in pregnancy, unspecified control: Secondary | ICD-10-CM

## 2012-11-11 DIAGNOSIS — O09523 Supervision of elderly multigravida, third trimester: Secondary | ICD-10-CM

## 2012-11-11 DIAGNOSIS — O0933 Supervision of pregnancy with insufficient antenatal care, third trimester: Secondary | ICD-10-CM

## 2012-11-11 DIAGNOSIS — O0930 Supervision of pregnancy with insufficient antenatal care, unspecified trimester: Secondary | ICD-10-CM

## 2012-11-11 DIAGNOSIS — E669 Obesity, unspecified: Secondary | ICD-10-CM

## 2012-11-11 DIAGNOSIS — O09293 Supervision of pregnancy with other poor reproductive or obstetric history, third trimester: Secondary | ICD-10-CM

## 2012-11-11 DIAGNOSIS — O403XX1 Polyhydramnios, third trimester, fetus 1: Secondary | ICD-10-CM

## 2012-11-11 DIAGNOSIS — O09529 Supervision of elderly multigravida, unspecified trimester: Secondary | ICD-10-CM

## 2012-11-11 DIAGNOSIS — O09299 Supervision of pregnancy with other poor reproductive or obstetric history, unspecified trimester: Secondary | ICD-10-CM

## 2012-11-11 LAB — POCT URINALYSIS DIP (DEVICE)
Protein, ur: NEGATIVE mg/dL
Specific Gravity, Urine: >=1.03 (ref 1.005–1.030)
Urobilinogen, UA: 0.2 mg/dL (ref 0.0–1.0)
pH: 6.5 (ref 5.0–8.0)

## 2012-11-11 LAB — FETAL NONSTRESS TEST

## 2012-11-11 NOTE — Progress Notes (Signed)
7/24 EFW 3563 gm (> 90%tile). Patient doing well without any complaints. Patient opted to have a repeat cesarean section and will be scheduled for next Monday at 39 weeks. Patient scheduled for f/u ob ultrasound on 8/14 with NST. NST today reviewed and reactive. CBG within range

## 2012-11-11 NOTE — Progress Notes (Signed)
Pulse: 103

## 2012-11-12 ENCOUNTER — Encounter: Payer: Self-pay | Admitting: *Deleted

## 2012-11-14 ENCOUNTER — Encounter (HOSPITAL_COMMUNITY)
Admission: RE | Admit: 2012-11-14 | Discharge: 2012-11-14 | Disposition: A | Discharge status: Home / Self Care | Payer: Medicaid Other | Source: Ambulatory Visit | Attending: Obstetrics and Gynecology | Admitting: Obstetrics and Gynecology

## 2012-11-14 ENCOUNTER — Ambulatory Visit (HOSPITAL_COMMUNITY)
Admission: RE | Admit: 2012-11-14 | Discharge: 2012-11-14 | Disposition: A | Discharge status: Home / Self Care | Payer: Medicaid Other | Source: Ambulatory Visit | Attending: Obstetrics & Gynecology | Admitting: Obstetrics & Gynecology

## 2012-11-14 ENCOUNTER — Ambulatory Visit (INDEPENDENT_AMBULATORY_CARE_PROVIDER_SITE_OTHER): Payer: Medicaid Other | Admitting: *Deleted

## 2012-11-14 ENCOUNTER — Encounter (HOSPITAL_COMMUNITY): Payer: Self-pay

## 2012-11-14 VITALS — BP 122/83 | HR 95 | Temp 98.2°F | Resp 20 | Ht 63.0 in | Wt 294.0 lb

## 2012-11-14 VITALS — BP 115/64

## 2012-11-14 DIAGNOSIS — O99810 Abnormal glucose complicating pregnancy: Secondary | ICD-10-CM | POA: Insufficient documentation

## 2012-11-14 DIAGNOSIS — O3663X0 Maternal care for excessive fetal growth, third trimester, not applicable or unspecified: Secondary | ICD-10-CM

## 2012-11-14 DIAGNOSIS — O24419 Gestational diabetes mellitus in pregnancy, unspecified control: Secondary | ICD-10-CM

## 2012-11-14 DIAGNOSIS — O09293 Supervision of pregnancy with other poor reproductive or obstetric history, third trimester: Secondary | ICD-10-CM

## 2012-11-14 DIAGNOSIS — O34219 Maternal care for unspecified type scar from previous cesarean delivery: Secondary | ICD-10-CM

## 2012-11-14 DIAGNOSIS — O0933 Supervision of pregnancy with insufficient antenatal care, third trimester: Secondary | ICD-10-CM

## 2012-11-14 DIAGNOSIS — O09529 Supervision of elderly multigravida, unspecified trimester: Secondary | ICD-10-CM | POA: Insufficient documentation

## 2012-11-14 DIAGNOSIS — O3660X0 Maternal care for excessive fetal growth, unspecified trimester, not applicable or unspecified: Secondary | ICD-10-CM | POA: Insufficient documentation

## 2012-11-14 DIAGNOSIS — O09523 Supervision of elderly multigravida, third trimester: Secondary | ICD-10-CM

## 2012-11-14 DIAGNOSIS — O409XX0 Polyhydramnios, unspecified trimester, not applicable or unspecified: Secondary | ICD-10-CM | POA: Insufficient documentation

## 2012-11-14 DIAGNOSIS — O99213 Obesity complicating pregnancy, third trimester: Secondary | ICD-10-CM

## 2012-11-14 DIAGNOSIS — E669 Obesity, unspecified: Secondary | ICD-10-CM | POA: Insufficient documentation

## 2012-11-14 DIAGNOSIS — O403XX1 Polyhydramnios, third trimester, fetus 1: Secondary | ICD-10-CM

## 2012-11-14 LAB — CBC
HCT: 35.8 % — ABNORMAL LOW (ref 36.0–46.0)
Hemoglobin: 11.8 g/dL — ABNORMAL LOW (ref 12.0–15.0)
MCH: 28.2 pg (ref 26.0–34.0)
MCHC: 33 g/dL (ref 30.0–36.0)
MCV: 85.4 fL (ref 78.0–100.0)
RBC: 4.19 MIL/uL (ref 3.87–5.11)

## 2012-11-14 LAB — BASIC METABOLIC PANEL
CO2: 22 mEq/L (ref 19–32)
Chloride: 100 mEq/L (ref 96–112)
Glucose, Bld: 149 mg/dL — ABNORMAL HIGH (ref 70–99)
Potassium: 4.1 mEq/L (ref 3.5–5.1)
Sodium: 133 mEq/L — ABNORMAL LOW (ref 135–145)

## 2012-11-14 LAB — RPR: RPR Ser Ql: NONREACTIVE

## 2012-11-14 NOTE — Patient Instructions (Addendum)
20 Norma Chambers  11/14/2012   Your procedure is scheduled on:  11/18/12  Enter through the Main Entrance of [facility] at 8 AM.  Pick up the phone at the desk and dial [phone].   Call this number if you have problems the morning of surgery: [phone]   Remember:   Do not eat food:After Midnight.  Do not drink clear liquids: After Midnight.  Take these medicines the morning of surgery with A SIP OF WATER: Do not take Metformin the night before surgery, do not take any diabetes medication on the day of the surgery.   Do not wear jewelry, make-up or nail polish.  Do not wear lotions, powders, or perfumes. You may wear deodorant.  Do not shave 48 hours prior to surgery.  Do not bring valuables to the hospital.  [facility] is not responsible                  for any belongings or valuables brought to the hospital.  Contacts, dentures or bridgework may not be worn into surgery.  Leave suitcase in the car. After surgery it may be brought to your room.  For patients admitted to the hospital, checkout time is 11:00 AM the day of                discharge.   Patients discharged the day of surgery will not be allowed to drive                   home.  Name and phone number of your driver: NA  Special Instructions: Shower using CHG 2 nights before surgery and the night before surgery.  If you shower the day of surgery use CHG.  Use special wash - you have one bottle of CHG for all showers.  You should use approximately 1/3 of the bottle for each shower.   Please read over the following fact sheets that you were given: Surgical Site Infection Prevention

## 2012-11-14 NOTE — Progress Notes (Signed)
NST reviewed and reactive.  

## 2012-11-14 NOTE — Progress Notes (Signed)
P = 95   US done today- EFW = 4823gm, AFI = 22.5cm. C/S is scheduled on 8/18.

## 2012-11-15 ENCOUNTER — Encounter: Payer: Self-pay | Admitting: Obstetrics & Gynecology

## 2012-11-18 ENCOUNTER — Inpatient Hospital Stay (HOSPITAL_COMMUNITY): Payer: Medicaid Other | Admitting: Anesthesiology

## 2012-11-18 ENCOUNTER — Encounter (HOSPITAL_COMMUNITY)
Admission: RE | Disposition: A | Discharge status: Home / Self Care | Payer: Self-pay | Source: Ambulatory Visit | Attending: Obstetrics and Gynecology

## 2012-11-18 ENCOUNTER — Encounter (HOSPITAL_COMMUNITY): Payer: Self-pay | Admitting: Anesthesiology

## 2012-11-18 ENCOUNTER — Encounter (HOSPITAL_COMMUNITY): Payer: Self-pay

## 2012-11-18 ENCOUNTER — Inpatient Hospital Stay (HOSPITAL_COMMUNITY)
Admission: RE | Admit: 2012-11-18 | Discharge: 2012-11-21 | DRG: 765 | Disposition: A | Discharge status: Home / Self Care | Payer: Medicaid Other | Source: Ambulatory Visit | Attending: Obstetrics and Gynecology | Admitting: Obstetrics and Gynecology

## 2012-11-18 ENCOUNTER — Other Ambulatory Visit: Payer: Medicaid Other

## 2012-11-18 ENCOUNTER — Inpatient Hospital Stay (HOSPITAL_COMMUNITY): Admit: 2012-11-18 | Payer: Self-pay | Admitting: Obstetrics and Gynecology

## 2012-11-18 DIAGNOSIS — O3663X0 Maternal care for excessive fetal growth, third trimester, not applicable or unspecified: Secondary | ICD-10-CM

## 2012-11-18 DIAGNOSIS — O34219 Maternal care for unspecified type scar from previous cesarean delivery: Principal | ICD-10-CM | POA: Diagnosis present

## 2012-11-18 DIAGNOSIS — O3660X0 Maternal care for excessive fetal growth, unspecified trimester, not applicable or unspecified: Secondary | ICD-10-CM | POA: Diagnosis present

## 2012-11-18 DIAGNOSIS — O09529 Supervision of elderly multigravida, unspecified trimester: Secondary | ICD-10-CM | POA: Diagnosis present

## 2012-11-18 DIAGNOSIS — O24419 Gestational diabetes mellitus in pregnancy, unspecified control: Secondary | ICD-10-CM

## 2012-11-18 DIAGNOSIS — O99814 Abnormal glucose complicating childbirth: Secondary | ICD-10-CM | POA: Diagnosis present

## 2012-11-18 DIAGNOSIS — O09523 Supervision of elderly multigravida, third trimester: Secondary | ICD-10-CM

## 2012-11-18 DIAGNOSIS — O409XX0 Polyhydramnios, unspecified trimester, not applicable or unspecified: Secondary | ICD-10-CM

## 2012-11-18 DIAGNOSIS — E669 Obesity, unspecified: Secondary | ICD-10-CM | POA: Diagnosis present

## 2012-11-18 DIAGNOSIS — O403XX3 Polyhydramnios, third trimester, fetus 3: Secondary | ICD-10-CM

## 2012-11-18 LAB — TYPE AND SCREEN
ABO/RH(D): O POS
Antibody Screen: NEGATIVE

## 2012-11-18 SURGERY — Surgical Case
Anesthesia: Regional | Site: Abdomen

## 2012-11-18 SURGERY — Surgical Case
Anesthesia: Spinal | Site: Abdomen | Wound class: Clean Contaminated

## 2012-11-18 MED ORDER — LACTATED RINGERS IV SOLN
INTRAVENOUS | Status: DC
  Administered 2012-11-18: 16:00:00 via INTRAVENOUS

## 2012-11-18 MED ORDER — NALBUPHINE HCL 10 MG/ML IJ SOLN
5.0000 mg | INTRAMUSCULAR | Status: DC | PRN
  Administered 2012-11-18: 10 mg via SUBCUTANEOUS
  Filled 2012-11-18: qty 1

## 2012-11-18 MED ORDER — NALBUPHINE HCL 10 MG/ML IJ SOLN
5.0000 mg | INTRAMUSCULAR | Status: DC | PRN
  Filled 2012-11-18: qty 1

## 2012-11-18 MED ORDER — PRENATAL MULTIVITAMIN CH
1.0000 | ORAL_TABLET | Freq: Every day | ORAL | Status: DC
  Administered 2012-11-19 – 2012-11-21 (×3): 1 via ORAL
  Filled 2012-11-18 (×3): qty 1

## 2012-11-18 MED ORDER — LACTATED RINGERS IV SOLN
INTRAVENOUS | Status: DC | PRN
  Administered 2012-11-18: 10:00:00 via INTRAVENOUS

## 2012-11-18 MED ORDER — DIBUCAINE 1 % RE OINT: 1.0000 "application " | TOPICAL_OINTMENT | RECTAL | Status: DC | PRN

## 2012-11-18 MED ORDER — LANOLIN HYDROUS EX OINT: 1.0000 "application " | TOPICAL_OINTMENT | CUTANEOUS | Status: DC | PRN

## 2012-11-18 MED ORDER — MORPHINE SULFATE (PF) 0.5 MG/ML IJ SOLN
INTRAMUSCULAR | Status: DC | PRN
  Administered 2012-11-18: .15 mg via INTRATHECAL

## 2012-11-18 MED ORDER — NALBUPHINE SYRINGE 5 MG/0.5 ML
INJECTION | INTRAMUSCULAR | Status: AC
  Filled 2012-11-18: qty 0.5

## 2012-11-18 MED ORDER — SENNOSIDES-DOCUSATE SODIUM 8.6-50 MG PO TABS
2.0000 | ORAL_TABLET | Freq: Every day | ORAL | Status: DC
  Administered 2012-11-18 – 2012-11-20 (×3): 2 via ORAL

## 2012-11-18 MED ORDER — OXYTOCIN 10 UNIT/ML IJ SOLN
INTRAMUSCULAR | Status: AC
  Filled 2012-11-18: qty 4

## 2012-11-18 MED ORDER — NALOXONE HCL 0.4 MG/ML IJ SOLN: 0.4000 mg | INTRAMUSCULAR | Status: DC | PRN

## 2012-11-18 MED ORDER — PHENYLEPHRINE HCL 10 MG/ML IJ SOLN
INTRAMUSCULAR | Status: DC | PRN
  Administered 2012-11-18: 80 ug via INTRAVENOUS
  Administered 2012-11-18 (×2): 40 ug via INTRAVENOUS

## 2012-11-18 MED ORDER — OXYCODONE-ACETAMINOPHEN 5-325 MG PO TABS
1.0000 | ORAL_TABLET | ORAL | Status: DC | PRN
  Administered 2012-11-18 – 2012-11-21 (×7): 1 via ORAL
  Filled 2012-11-18 (×4): qty 1
  Filled 2012-11-18 (×2): qty 2
  Filled 2012-11-18 (×2): qty 1

## 2012-11-18 MED ORDER — NALOXONE HCL 1 MG/ML IJ SOLN
1.0000 ug/kg/h | INTRAMUSCULAR | Status: DC | PRN
  Filled 2012-11-18: qty 2

## 2012-11-18 MED ORDER — ONDANSETRON HCL 4 MG/2ML IJ SOLN: 4.0000 mg | INTRAMUSCULAR | Status: DC | PRN

## 2012-11-18 MED ORDER — DIPHENHYDRAMINE HCL 50 MG/ML IJ SOLN: 25.0000 mg | INTRAMUSCULAR | Status: DC | PRN

## 2012-11-18 MED ORDER — HYDROMORPHONE HCL PF 1 MG/ML IJ SOLN: 0.2500 mg | INTRAMUSCULAR | Status: DC | PRN

## 2012-11-18 MED ORDER — OXYTOCIN 40 UNITS IN LACTATED RINGERS INFUSION - SIMPLE MED: 62.5000 mL/h | INTRAVENOUS | Status: AC

## 2012-11-18 MED ORDER — KETOROLAC TROMETHAMINE 30 MG/ML IJ SOLN: 30.0000 mg | Freq: Four times a day (QID) | INTRAMUSCULAR | Status: AC | PRN

## 2012-11-18 MED ORDER — DIPHENHYDRAMINE HCL 50 MG/ML IJ SOLN
INTRAMUSCULAR | Status: DC | PRN
  Administered 2012-11-18: 25 mg via INTRAVENOUS

## 2012-11-18 MED ORDER — MAGNESIUM HYDROXIDE 400 MG/5ML PO SUSP: 30.0000 mL | ORAL | Status: DC | PRN

## 2012-11-18 MED ORDER — EPHEDRINE 5 MG/ML INJ
INTRAVENOUS | Status: AC
  Filled 2012-11-18: qty 10

## 2012-11-18 MED ORDER — MENTHOL 3 MG MT LOZG: 1.0000 | LOZENGE | OROMUCOSAL | Status: DC | PRN

## 2012-11-18 MED ORDER — SODIUM CHLORIDE 0.9 % IJ SOLN: 3.0000 mL | INTRAMUSCULAR | Status: DC | PRN

## 2012-11-18 MED ORDER — DEXTROSE 5 % IV SOLN
3.0000 g | Freq: Once | INTRAVENOUS | Status: DC
  Filled 2012-11-18: qty 3000

## 2012-11-18 MED ORDER — DIPHENHYDRAMINE HCL 25 MG PO CAPS: 25.0000 mg | ORAL_CAPSULE | ORAL | Status: DC | PRN

## 2012-11-18 MED ORDER — ZOLPIDEM TARTRATE 5 MG PO TABS: 5.0000 mg | ORAL_TABLET | Freq: Every evening | ORAL | Status: DC | PRN

## 2012-11-18 MED ORDER — KETOROLAC TROMETHAMINE 60 MG/2ML IM SOLN
60.0000 mg | Freq: Once | INTRAMUSCULAR | Status: AC | PRN
  Filled 2012-11-18: qty 2

## 2012-11-18 MED ORDER — MEPERIDINE HCL 25 MG/ML IJ SOLN: 6.2500 mg | INTRAMUSCULAR | Status: DC | PRN

## 2012-11-18 MED ORDER — LACTATED RINGERS IV SOLN
INTRAVENOUS | Status: DC
  Administered 2012-11-18 (×2): via INTRAVENOUS

## 2012-11-18 MED ORDER — SCOPOLAMINE 1 MG/3DAYS TD PT72
MEDICATED_PATCH | TRANSDERMAL | Status: AC
  Administered 2012-11-18: 1.5 mg via TRANSDERMAL
  Filled 2012-11-18: qty 1

## 2012-11-18 MED ORDER — SCOPOLAMINE 1 MG/3DAYS TD PT72: 1.0000 | MEDICATED_PATCH | Freq: Once | TRANSDERMAL | Status: DC

## 2012-11-18 MED ORDER — ONDANSETRON HCL 4 MG/2ML IJ SOLN: 4.0000 mg | Freq: Three times a day (TID) | INTRAMUSCULAR | Status: DC | PRN

## 2012-11-18 MED ORDER — BUPIVACAINE IN DEXTROSE 0.75-8.25 % IT SOLN
INTRATHECAL | Status: DC | PRN
  Administered 2012-11-18: 1.5 mL via INTRATHECAL

## 2012-11-18 MED ORDER — FENTANYL CITRATE 0.05 MG/ML IJ SOLN
INTRAMUSCULAR | Status: DC | PRN
  Administered 2012-11-18: 25 ug via INTRATHECAL

## 2012-11-18 MED ORDER — PROMETHAZINE HCL 25 MG/ML IJ SOLN: 6.2500 mg | INTRAMUSCULAR | Status: DC | PRN

## 2012-11-18 MED ORDER — FENTANYL CITRATE 0.05 MG/ML IJ SOLN
INTRAMUSCULAR | Status: AC
  Filled 2012-11-18: qty 2

## 2012-11-18 MED ORDER — MORPHINE SULFATE 0.5 MG/ML IJ SOLN
INTRAMUSCULAR | Status: AC
  Filled 2012-11-18: qty 10

## 2012-11-18 MED ORDER — EPHEDRINE SULFATE 50 MG/ML IJ SOLN
INTRAMUSCULAR | Status: DC | PRN
  Administered 2012-11-18: 10 mg via INTRAVENOUS
  Administered 2012-11-18: 15 mg via INTRAVENOUS

## 2012-11-18 MED ORDER — DIPHENHYDRAMINE HCL 25 MG PO CAPS: 25.0000 mg | ORAL_CAPSULE | Freq: Four times a day (QID) | ORAL | Status: DC | PRN

## 2012-11-18 MED ORDER — IBUPROFEN 600 MG PO TABS
600.0000 mg | ORAL_TABLET | Freq: Four times a day (QID) | ORAL | Status: DC
  Administered 2012-11-18 – 2012-11-21 (×12): 600 mg via ORAL
  Filled 2012-11-18 (×12): qty 1

## 2012-11-18 MED ORDER — TETANUS-DIPHTH-ACELL PERTUSSIS 5-2.5-18.5 LF-MCG/0.5 IM SUSP: 0.5000 mL | Freq: Once | INTRAMUSCULAR | Status: DC

## 2012-11-18 MED ORDER — WITCH HAZEL-GLYCERIN EX PADS: 1.0000 "application " | MEDICATED_PAD | CUTANEOUS | Status: DC | PRN

## 2012-11-18 MED ORDER — SIMETHICONE 80 MG PO CHEW: 80.0000 mg | CHEWABLE_TABLET | ORAL | Status: DC | PRN

## 2012-11-18 MED ORDER — ONDANSETRON HCL 4 MG/2ML IJ SOLN
INTRAMUSCULAR | Status: DC | PRN
  Administered 2012-11-18: 4 mg via INTRAVENOUS

## 2012-11-18 MED ORDER — ONDANSETRON HCL 4 MG PO TABS: 4.0000 mg | ORAL_TABLET | ORAL | Status: DC | PRN

## 2012-11-18 MED ORDER — KETOROLAC TROMETHAMINE 30 MG/ML IJ SOLN: 15.0000 mg | Freq: Once | INTRAMUSCULAR | Status: DC | PRN

## 2012-11-18 MED ORDER — MEASLES, MUMPS & RUBELLA VAC ~~LOC~~ INJ: 0.5000 mL | INJECTION | Freq: Once | SUBCUTANEOUS | Status: DC

## 2012-11-18 MED ORDER — DEXTROSE 5 % IV SOLN
3.0000 g | INTRAVENOUS | Status: DC | PRN
  Administered 2012-11-18: 3 g via INTRAVENOUS

## 2012-11-18 MED ORDER — SIMETHICONE 80 MG PO CHEW
80.0000 mg | CHEWABLE_TABLET | Freq: Three times a day (TID) | ORAL | Status: DC
  Administered 2012-11-18 – 2012-11-21 (×9): 80 mg via ORAL

## 2012-11-18 MED ORDER — METOCLOPRAMIDE HCL 5 MG/ML IJ SOLN: 10.0000 mg | Freq: Three times a day (TID) | INTRAMUSCULAR | Status: DC | PRN

## 2012-11-18 MED ORDER — OXYTOCIN 10 UNIT/ML IJ SOLN
40.0000 [IU] | INTRAVENOUS | Status: DC | PRN
  Administered 2012-11-18: 40 [IU] via INTRAVENOUS

## 2012-11-18 MED ORDER — PHENYLEPHRINE 40 MCG/ML (10ML) SYRINGE FOR IV PUSH (FOR BLOOD PRESSURE SUPPORT)
PREFILLED_SYRINGE | INTRAVENOUS | Status: AC
  Filled 2012-11-18: qty 5

## 2012-11-18 MED ORDER — DIPHENHYDRAMINE HCL 50 MG/ML IJ SOLN: 12.5000 mg | INTRAMUSCULAR | Status: DC | PRN

## 2012-11-18 SURGICAL SUPPLY — 30 items
CLAMP CORD UMBIL (MISCELLANEOUS) IMPLANT
CONTAINER PREFILL 10% NBF 15ML (MISCELLANEOUS) IMPLANT
DRAPE LG THREE QUARTER DISP (DRAPES) ×2 IMPLANT
DRSG OPSITE POSTOP 4X10 (GAUZE/BANDAGES/DRESSINGS) ×2 IMPLANT
DURAPREP 26ML APPLICATOR (WOUND CARE) ×2 IMPLANT
ELECT REM PT RETURN 9FT ADLT (ELECTROSURGICAL) ×2
ELECTRODE REM PT RTRN 9FT ADLT (ELECTROSURGICAL) ×1 IMPLANT
EXTRACTOR VACUUM M CUP 4 TUBE (SUCTIONS) ×2 IMPLANT
GLOVE BIOGEL PI IND STRL 6.5 (GLOVE) ×1 IMPLANT
GLOVE BIOGEL PI IND STRL 7.0 (GLOVE) ×2 IMPLANT
GLOVE BIOGEL PI INDICATOR 6.5 (GLOVE) ×1
GLOVE BIOGEL PI INDICATOR 7.0 (GLOVE) ×2
GLOVE SURG SS PI 6.0 STRL IVOR (GLOVE) ×2 IMPLANT
GLOVE SURG SS PI 7.0 STRL IVOR (GLOVE) ×4 IMPLANT
GOWN STRL REIN XL XLG (GOWN DISPOSABLE) ×4 IMPLANT
KIT ABG SYR 3ML LUER SLIP (SYRINGE) IMPLANT
NEEDLE HYPO 25X5/8 SAFETYGLIDE (NEEDLE) IMPLANT
NS IRRIG 1000ML POUR BTL (IV SOLUTION) ×2 IMPLANT
PACK C SECTION WH (CUSTOM PROCEDURE TRAY) ×2 IMPLANT
PAD OB MATERNITY 4.3X12.25 (PERSONAL CARE ITEMS) ×2 IMPLANT
RTRCTR C-SECT PINK 25CM LRG (MISCELLANEOUS) ×2 IMPLANT
SEPRAFILM MEMBRANE 5X6 (MISCELLANEOUS) IMPLANT
STAPLER VISISTAT 35W (STAPLE) IMPLANT
SUT PLAIN 0 NONE (SUTURE) IMPLANT
SUT VIC AB 0 CT1 36 (SUTURE) ×8 IMPLANT
SUT VIC AB 4-0 KS 27 (SUTURE) ×2 IMPLANT
TOWEL OR 17X24 6PK STRL BLUE (TOWEL DISPOSABLE) ×2 IMPLANT
TRAY FOLEY CATH 14FR (SET/KITS/TRAYS/PACK) ×2 IMPLANT
WATER STERILE IRR 1000ML POUR (IV SOLUTION) ×2 IMPLANT
YANKAUER SUCT BULB TIP NO VENT (SUCTIONS) ×2 IMPLANT

## 2012-11-18 NOTE — Lactation Note (Signed)
This note was copied from the chart of Norma Chambers. Lactation Consultation Note     Initial consult with this mom of a term baby, in the NICU with hypoglycemia. Mom wants to breast feed, so I set up a DEP, and showed mom how to use premie setting, and then showed her how to hand express. It is difficult for mom to hand  express, since she has very large breast, small hand that are now edematous. She did not want dad to learn how to hlep her with hand expression. Mom has very easy to express colostrum.  Basic teaching done on providing EBM for a NICU baby. Mom is active with WIC, and was given the # to call to add baby. I will follow up with mom tomorrow. Lactation services reviewed with mom. Mom knows to call for questions/concerns.  Patient Name: Norma Chambers [id number] Date: 11/18/2012 Reason for consult: Initial assessment;NICU baby   Maternal Data Formula Feeding for Exclusion: Yes (baby in NICU) Infant to breast within first hour of birth: No Breastfeeding delayed due to:: Infant status Has patient been taught Hand Expression?: Yes Does the patient have breastfeeding experience prior to this delivery?: Yes  Feeding Feeding Type: Formula Nipple Type: Slow - flow Length of feed: 10 min  LATCH Score/Interventions                      Lactation Tools Discussed/Used Tools: Pump Breast pump type: Double-Electric Breast Pump WIC Program: Yes (mom needs to call to add baby and DEP) Pump Review: Setup, frequency, and cleaning;Milk Storage;Other (comment) (hand expression, teaching on providing EBM<) Initiated by:: c Alexee Delsanto  at 4 hours post partum Date initiated:: 11/18/12   Consult Status Consult Status: Follow-up Date: 11/19/12 Follow-up type: In-patient    Alfred Levins 11/18/2012, 2:46 PM

## 2012-11-18 NOTE — Op Note (Signed)
Norma Chambers PROCEDURE DATE: 11/18/2012  PREOPERATIVE DIAGNOSES: Intrauterine pregnancy at  [redacted]w[redacted]d weeks gestation; elective repeat cesarean   POSTOPERATIVE DIAGNOSES: The same  PROCEDURE: Repeat Low Transverse Cesarean Section  SURGEON:  Dr. Jolayne Panther  ASSISTANT:  Dr. Rulon Abide  ANESTHESIOLOGIST: Dr. Malen Gauze  INDICATIONS: Norma Chambers is a 42 y.o. [id number] at [redacted]w[redacted]d here for cesarean section secondary to the indications listed under preoperative diagnoses; please see preoperative note for further details.  The risks of cesarean section were discussed with the patient including but were not limited to: bleeding which may require transfusion or reoperation; infection which may require antibiotics; injury to bowel, bladder, ureters or other surrounding organs; injury to the fetus; need for additional procedures including hysterectomy in the event of a life-threatening hemorrhage; placental abnormalities wth subsequent pregnancies, incisional problems, thromboembolic phenomenon and other postoperative/anesthesia complications.   The patient concurred with the proposed plan, giving informed written consent for the procedure.    FINDINGS:  Viable female infant in cephalic presentation.  Apgars 8 and 9.  Clear amniotic fluid.  Intact placenta, three vessel cord.  Normal uterus, fallopian tubes and ovaries bilaterally.  ANESTHESIA: Spinal INTRAVENOUS FLUIDS: 1000 ml ESTIMATED BLOOD LOSS: 1100 ml URINE OUTPUT:  50 ml SPECIMENS: Placenta sent to L&D COMPLICATIONS: None immediate  PROCEDURE IN DETAIL:  The patient preoperatively received intravenous antibiotics and had sequential compression devices applied to her lower extremities.  She was then taken to the operating room where spinal anesthesia was administered and was found to be adequate. She was then placed in a dorsal supine position with a leftward tilt, and prepped and draped in a sterile manner.  A foley catheter was placed into her bladder and  attached to constant gravity.  After an adequate timeout was performed, a Pfannenstiel skin incision was made with scalpel and carried through to the underlying layer of fascia. The fascia was incised in the midline, and this incision was extended bilaterally using the Mayo scissors.  Kocher clamps were applied to the superior aspect of the fascial incision and the underlying rectus muscles were dissected off bluntly. A similar process was carried out on the inferior aspect of the fascial incision. The rectus muscles were separated in the midline bluntly and the peritoneum was entered bluntly. Attention was turned to the lower uterine segment where a low transverse hysterotomy was made with a scalpel and extended bilaterally bluntly.  The infant was successfully delivered via vacuum assist, the cord was clamped and cut and the infant was handed over to awaiting neonatology team. Uterine massage was then administered, and the placenta delivered intact with a three-vessel cord. The uterus was then cleared of clot and debris.  The hysterotomy was closed with 0 Vicryl in a running locked fashion, and an imbricating layer was also placed with 0 Vicryl x2. The pelvis was cleared of all clot and debris. Hemostasis was confirmed on all surfaces.  The fascia was then closed using 0 Vicryl in a running fashion.  The subcutaneous layer was irrigated. The skin was closed with a 4-0 Vicryl subcuticular stitch. A vacuum was placed on the incision. The patient tolerated the procedure well. Sponge, lap, instrument and needle counts were correct x 2.  She was taken to the recovery room in stable condition.

## 2012-11-18 NOTE — Anesthesia Postprocedure Evaluation (Signed)
  Anesthesia Post-op Note  Patient: Holiday representative  Procedure(s) Performed: Procedure(s): REPEAT CESAREAN SECTION (N/A)  Patient Location: PACU and Mother/Baby  Anesthesia Type:Spinal and Epidural  Level of Consciousness: awake, alert  and oriented  Airway and Oxygen Therapy: Patient Spontanous Breathing  Post-op Pain: mild  Post-op Assessment: Patient's Cardiovascular Status Stable, Respiratory Function Stable and No signs of Nausea or vomiting  Post-op Vital Signs: Reviewed and stable  Complications: No apparent anesthesia complications

## 2012-11-18 NOTE — H&P (Signed)
Norma Chambers is a 42 y.o. female G3P2002 at 52 weeks presenting for scheduled repeat cesarean section. Patient with prenatal care at [facility] since [redacted] weeks GA. PNC complicated by GDM on glyburide, morbid obesity, AMA, polyhydramnios and LGA. Patient currently without complaints and plans for Paraguard for contraception. History OB History   Grav Para Term Preterm Abortions TAB SAB Ect Mult Living   3 2 2       2      Past Medical History  Diagnosis Date  . Gestational diabetes    Past Surgical History  Procedure Laterality Date  . Cesarean section     Family History: family history includes Diabetes in her mother. Social History:  reports that she has never smoked. She has never used smokeless tobacco. She reports that she does not drink alcohol or use illicit drugs.   Prenatal Transfer Tool  Maternal Diabetes: Yes:  Diabetes Type:  Insulin/Medication controlled Genetic Screening: Declined Maternal Ultrasounds/Referrals: Normal Fetal Ultrasounds or other Referrals:  None Maternal Substance Abuse:  No Significant Maternal Medications:  Meds include: Other: glyburide Significant Maternal Lab Results:  None Other Comments:  polyhydramnios  Review of Systems  All other systems reviewed and are negative.      Blood pressure 137/78, pulse 101, temperature 98.4 F (36.9 C), temperature source Oral, resp. rate 20, last menstrual period 02/19/2012. Exam Physical Exam  GENERAL: Well-developed, well-nourished female in no acute distress.  HEENT: Normocephalic, atraumatic. Sclerae anicteric.  NECK: Supple. Normal thyroid.  LUNGS: Clear to auscultation bilaterally.  HEART: Regular rate and rhythm. BREASTS: Symmetric in size. No palpable masses or lymphadenopathy, skin changes, or nipple drainage. ABDOMEN: Soft, nontender,obese, gravid PELVIC: Not indicated EXTREMITIES: No cyanosis, clubbing, or edema, 2+ distal pulses.  Prenatal labs: ABO, Rh: --/--/O POS (08/14  1114) Antibody: NEG (08/14 1114) Rubella: Immune (05/05 0000) RPR: NON REACTIVE (08/14 1114)  HBsAg: Negative (05/05 0000)  HIV: NON REACTIVE (06/02 0906)  GBS:   positive  Assessment/Plan: 42 yo G3P2002 at 39 weeks here for elective repeat cesarean  - Risks, benefits and alternatives were explained including but not limited to risks of bleeding, infection, damage to adjacent organs. Patient verbalized understanding and all questions were answered. Patient signed consent.   Reagen Haberman 11/18/2012, 9:05 AM

## 2012-11-18 NOTE — Transfer of Care (Signed)
Immediate Anesthesia Transfer of Care Note  Patient: Norma Chambers  Procedure(s) Performed: Procedure(s): REPEAT CESAREAN SECTION (N/A)  Patient Location: PACU  Anesthesia Type:Spinal  Level of Consciousness: awake, alert  and oriented  Airway & Oxygen Therapy: Patient Spontanous Breathing  Post-op Assessment: Report given to PACU RN and Post -op Vital signs reviewed and stable  Post vital signs: stable  Complications: No apparent anesthesia complications

## 2012-11-18 NOTE — Anesthesia Postprocedure Evaluation (Signed)
Anesthesia Post Note  Patient: Norma Chambers  Procedure(s) Performed: Procedure(s) (LRB): REPEAT CESAREAN SECTION (N/A)  Anesthesia type: Spinal  Patient location: PACU  Post pain: Pain level controlled  Post assessment: Post-op Vital signs reviewed  Last Vitals:  Filed Vitals:   11/18/12 1215  BP: 116/56  Pulse: 91  Temp: 36.7 C  Resp: 19    Post vital signs: Reviewed  Level of consciousness: awake  Complications: No apparent anesthesia complications

## 2012-11-18 NOTE — Anesthesia Preprocedure Evaluation (Addendum)
Anesthesia Evaluation  Patient identified by MRN, date of birth, ID band Patient awake    Reviewed: Allergy & Precautions, H&P , NPO status , Patient's Chart, lab work & pertinent test results  Airway Mallampati: III TM Distance: >3 FB Neck ROM: full    Dental no notable dental hx.    Pulmonary neg pulmonary ROS,    Pulmonary exam normal       Cardiovascular negative cardio ROS      Neuro/Psych negative neurological ROS  negative psych ROS   GI/Hepatic negative GI ROS, Neg liver ROS,   Endo/Other  diabetes, Gestational, Oral Hypoglycemic AgentsMorbid obesity  Renal/GU negative Renal ROS  negative genitourinary   Musculoskeletal negative musculoskeletal ROS (+)   Abdominal (+) + obese,   Peds  Hematology negative hematology ROS (+)   Anesthesia Other Findings   Reproductive/Obstetrics (+) Pregnancy                           Anesthesia Physical Anesthesia Plan  ASA: III  Anesthesia Plan: Spinal   Post-op Pain Management:    Induction:   Airway Management Planned:   Additional Equipment:   Intra-op Plan:   Post-operative Plan:   Informed Consent: I have reviewed the patients History and Physical, chart, labs and discussed the procedure including the risks, benefits and alternatives for the proposed anesthesia with the patient or authorized representative who has indicated his/her understanding and acceptance.     Plan Discussed with: CRNA and Surgeon  Anesthesia Plan Comments:       Anesthesia Quick Evaluation

## 2012-11-18 NOTE — Anesthesia Procedure Notes (Signed)
Spinal  Patient location during procedure: OR Start time: 11/18/2012 9:41 AM Staffing Anesthesiologist: Angus Seller., Harrell Gave. Performed by: anesthesiologist  Preanesthetic Checklist Completed: patient identified, site marked, surgical consent, pre-op evaluation, timeout performed, IV checked, risks and benefits discussed and monitors and equipment checked Spinal Block Patient position: sitting Prep: DuraPrep Patient monitoring: heart rate, cardiac monitor, continuous pulse ox and blood pressure Approach: midline Location: L3-4 Injection technique: single-shot Needle Needle type: Sprotte  Needle gauge: 24 G Needle length: 9 cm Assessment Sensory level: T4 Additional Notes Patient identified.  Risk benefits discussed including failed block, incomplete pain control, headache, nerve damage, paralysis, blood pressure changes, nausea, vomiting, reactions to medication both toxic or allergic, and postpartum back pain.  Patient expressed understanding and wished to proceed.  All questions were answered.  Sterile technique used throughout procedure.  CSF was clear.  No parasthesia or other complications.  Please see nursing notes for vital signs.

## 2012-11-19 ENCOUNTER — Encounter (HOSPITAL_COMMUNITY): Payer: Self-pay | Admitting: Obstetrics and Gynecology

## 2012-11-19 LAB — CBC
Hemoglobin: 10.4 g/dL — ABNORMAL LOW (ref 12.0–15.0)
MCH: 28.7 pg (ref 26.0–34.0)
RBC: 3.63 MIL/uL — ABNORMAL LOW (ref 3.87–5.11)

## 2012-11-19 NOTE — Progress Notes (Signed)
Subjective: Postpartum Day 1: Cesarean Delivery Patient reports incisional pain, tolerating PO, + BM and no problems voiding.    Objective: Vital signs in last 24 hours: Temp:  [97.4 F (36.3 C)-98.6 F (37 C)] 98.6 F (37 C) (08/19 0630) Pulse Rate:  [83-103] 96 (08/19 0630) Resp:  [15-20] 18 (08/19 0630) BP: (99-137)/(46-78) 105/68 mmHg (08/19 0630) SpO2:  [95 %-100 %] 98 % (08/19 0630) Weight:  [133.358 kg (294 lb)] 133.358 kg (294 lb) (08/18 1610)  Physical Exam:  General: alert, cooperative and appears stated age Lochia: appropriate Uterine Fundus: firm Incision: wound vac in place on suction. Some areas of serosanguinous drainage but appropriate.  DVT Evaluation: No evidence of DVT seen on physical exam. Negative Homan's sign. No cords or calf tenderness.   Recent Labs  11/19/12 0546  HGB 10.4*  HCT 31.2*    Assessment/Plan: Status post Cesarean section. Doing well postoperatively.  Continue current care.  CLARK, MICHAEL L 11/19/2012, 7:17 AM  I have seen and examined this patient and agree with above documentation in the PA student's note. Pt is POD#1 after RLTCS.  Has a wound vac in place to suction.  Some mild serosanguinous drainage on the bandage but appropriate.  Pain well controlled.  Up and out of bed and passing flatus.  Will plan for d/c tomorrow and will need 1 week wound vac f/u.   Rulon Abide, M.D. [facility] Fellow 11/19/2012 9:44 AM

## 2012-11-19 NOTE — Lactation Note (Signed)
This note was copied from the chart of Norma Chambers. Lactation Consultation Note    Brief follow up consult with mom today. She has not been pumping due to nipple pain and cramping. i reviewed with mom the importance to  consistent pumping, at l;est every 3 hours. She also has a red rea around the base of her right nipple. i showed mom how to apply expressed breast milk to her nipple, and gave her comfort gels, and instructed her in their use. I also increased mom to size 30 flanges. I enocurged mom to go to the NICU and breast feed her baby. i will follow this family in the NICU. Mom encouraged  To call for my help with any other problems or discomfort.   Patient Name: Norma Zanovia Rotz ZOXWR'U Date: 11/19/2012 Reason for consult: Follow-up assessment;NICU baby   Maternal Data    Feeding    LATCH Score/Interventions          Comfort (Breast/Nipple): Filling, red/small blisters or bruises, mild/mod discomfort  Problem noted: Mild/Moderate discomfort;Cracked, bleeding, blisters, bruises (base of right nipple red, tender) Interventions (Mild/moderate discomfort): Comfort gels        Lactation Tools Discussed/Used Tools: Comfort gels;Flanges Flange Size: 30   Consult Status Consult Status: Follow-up Date: 11/20/12 Follow-up type: In-patient    Alfred Levins 11/19/2012, 4:57 PM

## 2012-11-19 NOTE — Op Note (Signed)
Attestation of Attending Supervision of Advanced Practitioner (CNM/NP): Evaluation and management procedures were performed by the Advanced Practitioner under my supervision and collaboration.  I have reviewed the Advanced Practitioner's note and chart, and I agree with the management and plan.  Binnie Droessler 11/19/2012 6:52 AM

## 2012-11-19 NOTE — Progress Notes (Signed)
Ur chart review completed.  

## 2012-11-19 NOTE — Progress Notes (Signed)
Patient verbalizes that she understands [place] well.  She also speaks [place] fluently.  Other languages that she speaks include [place] and Arabic.  Offered international interpreter line and patient declined.

## 2012-11-20 NOTE — Lactation Note (Signed)
This note was copied from the chart of Norma Lacosta Dolin. Lactation Consultation Note   Follow up consul twith this mom of a NICU baby. She has increasing redness from pumping around nipple base, so I increased mom to 36 flanges. She is wearing her comfort gels. Mom plans to have dad take her to [facility] tomorrow, after discharge, to get a DEP. I Encouraged mom to ask baby's nurse when she can breast feed. She said she visits in the evening, when dad takes the other 2 children home, and she will ask if she can breast feed. I will follow this family in the nICU.  Patient Name: Norma Chambers [id number] Date: 11/20/2012 Reason for consult: Follow-up assessment;NICU baby   Maternal Data    Feeding Feeding Type: Breast Milk with Formula added Nipple Type: Slow - flow Length of feed: 30 min  LATCH Score/Interventions          Comfort (Breast/Nipple): Filling, red/small blisters or bruises, mild/mod discomfort  Problem noted: Mild/Moderate discomfort        Lactation Tools Discussed/Used Tools: Flanges Flange Size: 36 WIC Program: Yes (mom to get DEP on d/c tomorrow)   Consult Status Consult Status: Follow-up Date: 11/21/12 Follow-up type: In-patient (in NICU)    Norma Chambers 11/20/2012, 3:09 PM

## 2012-11-20 NOTE — Progress Notes (Signed)
Post Op Day 2: Cesarean Delivery  Subjective: Tolerating PO, up ad lib, some incisional pain, voiding well, + flatus  Objective: Vital signs in last 24 hours: Temp:  [98 F (36.7 C)-98.2 F (36.8 C)] 98.2 F (36.8 C) (08/20 0653) Pulse Rate:  [89-92] 92 (08/20 0653) Resp:  [18] 18 (08/20 0653) BP: (118-127)/(56-82) 127/82 mmHg (08/20 1610)  Physical Exam:  General: alert, cooperative and appears stated age Lochia: appropriate Uterine Fundus: firm Incision: wound vac in place on suction. Some areas of serosanguinous drainage but appropriate.  DVT Evaluation: No evidence of DVT seen on physical exam. Negative Homan's sign. No cords or calf tenderness.   Recent Labs  11/19/12 0546  HGB 10.4*  HCT 31.2*    Assessment/Plan: POD 2 s/p RLTCS. Doing well postoperatively.  Continue routine post op care. Breastfeeding (pumping) - baby in NICU Plans for Paraguard for contraception. Pt prefers to stay until tomorrow since daughter is in NICU. Will plan for discharge home tomorrow. Needs 1 week wound vac f/u after discharge.  Levert Feinstein 11/20/2012, 7:43 AM

## 2012-11-20 NOTE — Progress Notes (Signed)
I have seen and examined this patient and I agree with the above. Cam Hai 9:33 AM 11/20/2012

## 2012-11-21 ENCOUNTER — Encounter (HOSPITAL_COMMUNITY): Payer: Self-pay | Admitting: *Deleted

## 2012-11-21 MED ORDER — IBUPROFEN 600 MG PO TABS: 600.0000 mg | ORAL_TABLET | Freq: Four times a day (QID) | ORAL | Status: DC

## 2012-11-21 MED ORDER — OXYCODONE-ACETAMINOPHEN 5-325 MG PO TABS: 1.0000 | ORAL_TABLET | ORAL | Status: DC | PRN

## 2012-11-21 NOTE — Discharge Summary (Signed)
Obstetric Discharge Summary Reason for Admission: cesarean section Prenatal Procedures: NST Intrapartum Procedures: cesarean: low cervical, transverse and wound vac placed post op for healing Postpartum Procedures: none Complications-Operative and Postpartum: none Hemoglobin  Date Value Range Status  11/19/2012 10.4* 12.0 - 15.0 g/dL Final  04/08/1094 04.5   Final     HCT  Date Value Range Status  11/19/2012 31.2* 36.0 - 46.0 % Final  08/05/2012 36   Final   Filed Vitals:   11/21/12 0618  BP: 126/59  Pulse: 86  Temp: 97.6 F (36.4 C)  Resp: 20    Results for Norma, Chambers (MRN [id number]) as of 11/21/2012 07:49  Ref. Range 11/14/2012 15:10 11/18/2012 08:21 11/18/2012 08:30 11/18/2012 11:39 11/19/2012 05:46  Glucose-Capillary Latest Range: 70-99 mg/dL   94 96     Physical Exam:  General: alert, cooperative and appears stated age Lochia: appropriate Uterine Fundus: firm Incision: healing well, no significant drainage, no dehiscence DVT Evaluation: No evidence of DVT seen on physical exam.  Discharge Diagnoses: Term Pregnancy-delivered  Discharge Information: Date: 11/21/2012 Activity: pelvic rest Diet: routine Medications: Ibuprofen Condition: stable Instructions: refer to practice specific booklet Discharge to: home  Patient requesting Percocet for pain control.  Dr. Reola Calkins made aware of patient's request.      Newborn Data: Live born female  Birth Weight: 10 lb 7.2 oz (4740 g) APGAR: 8, 9   Baby to NICU for observation.   CLARK, MICHAEL L 11/21/2012, 7:16 AM  I have seen and examined this patient and agree with above documentation in the PA student's note. Pt presented for elective repeat c/s.  C/s was without complication and a PICO wound vac was placed prophylactically given BMI. Her postpartum care was uncomplicated and post partum FSBG was WNL.  She was discharged off her glyburide and metformin. Of note, on POD#3, her wound vac became disconnected and as such, was  left off.  She will f/u in 1 week for wound check and then in 4 weeks for post partum visit.  Pt is breast feeding and plans for copper IUD for birth control. Baby is in NICU with plans to be released later today or tomorrow.   Rulon Abide, M.D. [facility] Fellow 11/21/2012 1:27 PM

## 2012-11-21 NOTE — Discharge Summary (Signed)
Attestation of Attending Supervision of Fellow: Evaluation and management procedures were performed by the Fellow under my supervision and collaboration.  I have reviewed the Fellow's note and chart, and I agree with the management and plan.    

## 2012-11-22 NOTE — Progress Notes (Signed)
Clinical Social Work Department PSYCHOSOCIAL ASSESSMENT - MATERNAL/CHILD 11/22/2012  Patient:  Hornik,Shaquel  Account Number:  [id number]  Admit Date:  11/18/2012  Childs Name:   Yasmine Mohamed    Clinical Social Worker:  Rudi Knippenberg, LCSW   Date/Time:  11/21/2012 10:00 AM  Date Referred:  11/21/2012      Other referral source:   No referral-NICU admission    I:  FAMILY / HOME ENVIRONMENT Child's legal guardian:  PARENT  Guardian - Name Guardian - Age Guardian - Address  Jazalyn Cherif [address] [place], [place] [address]  Tarek Mohamed  same   Other household support members/support persons Name Relationship DOB   DAUGHTER 5   SON 2.5   Other support:   Parents report good supports    II  PSYCHOSOCIAL DATA Information Source:  Family Interview  Financial and Community Resources Employment:   FOB states he has been out of work since February due to a knee injury and plans to look for work within the next month.  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   Financial resources:  Medicaid If Medicaid - County:  [place]  School / Grade:   Maternity Care Coordinator / Child Services Coordination / Early Interventions:  Cultural issues impacting care:   Family is Muslim.  They are both fluent in English, but CSW found them somewhat hard to understand due to a thick accent.    III  STRENGTHS Strengths  Compliance with medical plan  Other - See comment  Supportive family/friends  Understanding of illness   Strength comment:  Pediatric follow up will be at [facility] on [address].   IV  RISK FACTORS AND CURRENT PROBLEMS Current Problem:  None   Risk Factor & Current Problem Patient Issue Family Issue Risk Factor / Current Problem Comment   N N     V  SOCIAL WORK ASSESSMENT  CSW met with family in MOB's first floor room to introduce myself and complete assessment due to NICU admission.  Parents were very welcoming and friendly  and extremely appreciative of CSW's intervention.  Their two other children were in the room with them as well.  They report doing well, but focused mainly on when baby will be able to be discharged from the hospital.  CSW explained that it has not been determined and asked them to give CSW an update.  They seem to have a good understanding of the situation and CSW commented that it sounds like baby is making progress.  They report that they have transportation and visiting baby after MOB's discharge today will not be a problem.  CSW informed them of the possibility of rooming in if they wish and MOB states she would like to.  She understands that this will only be for the night prior to expected discharge.  FOB states he will stay with the two older children at home.  FOB informed CSW of the financial hardship they are experiencing due to his injury and being out of work since February.  They report having some basic items for baby, but do not have a bed for her.  CSW offered a pack n play from [employer] and they accepted with immense gratitude.  CSW discussed emotions related to the situation, especially in regards to MOB's discharge today.  She states she is sad, but feels she is coping well.  MOB denies symptoms of PPD with other two children and CSW briefly discussed   signs and symptoms to watch for.  CSW explained ongoing support services offered by NICU CSW and gave contact information.  Family states no further questions or needs.  CSW has no social concerns at this time and sees no barriers to discharge when baby is medically ready.   VI SOCIAL WORK PLAN Social Work Plan  Psychosocial Support/Ongoing Assessment of Needs   Type of pt/family education:   PPD signs and symptoms   If child protective services report - county:   If child protective services report - date:   Information/referral to community resources comment:   [employer] for pack n play.   Other social work  plan:    

## 2012-12-01 ENCOUNTER — Encounter (HOSPITAL_COMMUNITY): Payer: Self-pay | Admitting: Emergency Medicine

## 2012-12-01 ENCOUNTER — Emergency Department (HOSPITAL_COMMUNITY)
Admission: EM | Admit: 2012-12-01 | Discharge: 2012-12-01 | Disposition: A | Discharge status: Home / Self Care | Payer: Medicaid Other | Source: Home / Self Care | Attending: Emergency Medicine | Admitting: Emergency Medicine

## 2012-12-01 DIAGNOSIS — H6690 Otitis media, unspecified, unspecified ear: Secondary | ICD-10-CM

## 2012-12-01 DIAGNOSIS — H60399 Other infective otitis externa, unspecified ear: Secondary | ICD-10-CM

## 2012-12-01 DIAGNOSIS — H6093 Unspecified otitis externa, bilateral: Secondary | ICD-10-CM

## 2012-12-01 MED ORDER — AMOXICILLIN-POT CLAVULANATE 875-125 MG PO TABS: 1.0000 | ORAL_TABLET | Freq: Two times a day (BID) | ORAL | Status: DC

## 2012-12-01 MED ORDER — CIPROFLOXACIN-DEXAMETHASONE 0.3-0.1 % OT SUSP: 4.0000 [drp] | Freq: Two times a day (BID) | OTIC | Status: AC

## 2012-12-01 NOTE — ED Provider Notes (Signed)
Medical screening examination/treatment/procedure(s) were performed by non-physician practitioner and as supervising physician I was immediately available for consultation/collaboration.  Leslee Home, M.D.  Reuben Likes, MD 12/01/12 [phone]

## 2012-12-01 NOTE — ED Provider Notes (Signed)
CSN: [id number]     Arrival date & time 12/01/12  1020 History   First MD Initiated Contact with Patient 12/01/12 1052     Chief Complaint  Patient presents with  . Otalgia   (Consider location/radiation/quality/duration/timing/severity/associated sxs/prior Treatment) HPI Comments: 42 year old female presents for evaluation of bilateral ear pain. She states she has an infection in both ears and last night she had a fever of 100. She is taking ibuprofen for this which has helped. She has a history of similar infections. She denies any pain elsewhere. She is currently breast-feeding and gave birth 10 days ago Denies all other review of systems.  [name] presenting with ear pain.  Otalgia Associated symptoms: fever   Associated symptoms: no abdominal pain, no cough, no rash and no vomiting     Past Medical History  Diagnosis Date  . Gestational diabetes    Past Surgical History  Procedure Laterality Date  . Cesarean section    . Cesarean section N/A 11/18/2012    Procedure: REPEAT CESAREAN SECTION;  Surgeon: Catalina Antigua, MD;  Location: [facility];  Service: Obstetrics;  Laterality: N/A;   Family History  Problem Relation Age of Onset  . Diabetes Mother    History  Substance Use Topics  . Smoking status: Never Smoker   . Smokeless tobacco: Never Used  . Alcohol Use: No   OB History   Grav Para Term Preterm Abortions TAB SAB Ect Mult Living   3 3 3       3      Review of Systems  Constitutional: Positive for fever. Negative for chills.  HENT: Positive for ear pain.   Eyes: Negative for visual disturbance.  Respiratory: Negative for cough and shortness of breath.   Cardiovascular: Negative for chest pain, palpitations and leg swelling.  Gastrointestinal: Negative for nausea, vomiting and abdominal pain.  Endocrine: Negative for polydipsia and polyuria.  Genitourinary: Negative for dysuria, urgency and frequency.  Musculoskeletal: Negative for myalgias and  arthralgias.  Skin: Negative for rash.  Neurological: Negative for dizziness, weakness and light-headedness.    Allergies  Review of patient's allergies indicates no known allergies.  Home Medications   Current Outpatient Rx  Name  Route  Sig  Dispense  Refill  . amoxicillin-clavulanate (AUGMENTIN) 875-125 MG per tablet   Oral   Take 1 tablet by mouth every 12 (twelve) hours.   14 tablet   0   . ciprofloxacin-dexamethasone (CIPRODEX) otic suspension   Both Ears   Place 4 drops into both ears 2 (two) times daily.   7.5 mL   1   . glucose blood test strip      Use as instructed- test blood sugar 4 times daily   50 each   12     Please give 30 day supply of strips for Accu-chek  ...   . ibuprofen (ADVIL,MOTRIN) 600 MG tablet   Oral   Take 1 tablet (600 mg total) by mouth every 6 (six) hours.   30 tablet   0   . oxyCODONE-acetaminophen (PERCOCET/ROXICET) 5-325 MG per tablet   Oral   Take 1-2 tablets by mouth every 4 (four) hours as needed.   30 tablet   0   . Prenatal Vit-Fe Fumarate-FA (MULTIVITAMIN-PRENATAL) 27-0.8 MG TABS   Oral   Take 1 tablet by mouth daily at 12 noon.          BP 128/71  Pulse 110  Temp(Src) 98.9 F (37.2 C) (Oral)  Resp 19  SpO2 98%  LMP 11/19/2011  Breastfeeding? Yes Physical Exam  Nursing note and vitals reviewed. Constitutional: She is oriented to person, place, and time. She appears well-developed and well-nourished. No distress.  HENT:  Head: Normocephalic and atraumatic.  Right Ear: Hearing normal. There is drainage and swelling. Tympanic membrane is injected and bulging. A middle ear effusion is present.  Left Ear: Hearing normal. There is drainage and swelling. Tympanic membrane is injected and bulging. A middle ear effusion is present.  Pulmonary/Chest: Effort normal.  Musculoskeletal: Normal range of motion. She exhibits no tenderness.  Neurological: She is alert and oriented to person, place, and time. Coordination  normal.  Skin: Skin is warm and dry. No rash noted. She is not diaphoretic.  Psychiatric: She has a normal mood and affect.    ED Course  Procedures (including critical care time) Labs Review Labs Reviewed - No data to display Imaging Review No results found.  MDM   1. AOM (acute otitis media), bilateral   2. Otitis externa of both ears    Treat with oral antibiotics as well as drops. Followup if not improving   Meds ordered this encounter  Medications  . ciprofloxacin-dexamethasone (CIPRODEX) otic suspension    Sig: Place 4 drops into both ears 2 (two) times daily.    Dispense:  7.5 mL    Refill:  1  . amoxicillin-clavulanate (AUGMENTIN) 875-125 MG per tablet    Sig: Take 1 tablet by mouth every 12 (twelve) hours.    Dispense:  14 tablet    Refill:  0       Graylon Good, PA-C 12/01/12 1105

## 2012-12-01 NOTE — ED Notes (Signed)
C/o bilateral ear pain which started last night.   Patient states she has pain and fever.  Patient is breastfeeding but did take ibuprofen for the pain.

## 2012-12-03 ENCOUNTER — Encounter: Payer: Self-pay | Admitting: *Deleted

## 2012-12-04 ENCOUNTER — Ambulatory Visit: Payer: Medicaid Other

## 2012-12-05 ENCOUNTER — Encounter: Payer: Self-pay | Admitting: Obstetrics and Gynecology

## 2012-12-05 ENCOUNTER — Ambulatory Visit (INDEPENDENT_AMBULATORY_CARE_PROVIDER_SITE_OTHER): Payer: Medicaid Other | Admitting: Obstetrics and Gynecology

## 2012-12-05 VITALS — BP 127/84 | HR 88 | Temp 96.8°F | Ht 62.0 in | Wt 275.4 lb

## 2012-12-05 DIAGNOSIS — Z09 Encounter for follow-up examination after completed treatment for conditions other than malignant neoplasm: Secondary | ICD-10-CM

## 2012-12-05 DIAGNOSIS — Z5189 Encounter for other specified aftercare: Secondary | ICD-10-CM

## 2012-12-05 NOTE — Progress Notes (Signed)
Patient ID: Norma Chambers, female   DOB: 04-Sep-1970, 42 y.o.   MRN: [id number] 42 yo G3P3 s/p repeat cesarean section on 8/18 presenting today for wound check. Patient had PICO wound vac applied to incision but it was removed on day 4 by patient. Patient denies any discomfort or abnormal drainage from incision  GENERAL: Well-developed, well-nourished female in no acute distress.  ABDOMEN: Soft, nontender, nondistended. Obese with large panus Incision: no eryhtma, induration or drainage. Healing very well EXTREMITIES: No cyanosis, clubbing, or edema, 2+ distal pulses.  A/P 42 yo s/p repeat cesarean section on 8/18 here for wound check - incision healing well - advised to keep area clean and dry at all times given overlying panus - RTC on 10/3 for postpartum visit

## 2013-01-03 ENCOUNTER — Ambulatory Visit: Payer: Medicaid Other | Admitting: Obstetrics & Gynecology

## 2013-01-08 ENCOUNTER — Ambulatory Visit: Payer: Medicaid Other | Admitting: Obstetrics & Gynecology

## 2013-04-29 ENCOUNTER — Ambulatory Visit: Payer: Medicaid Other

## 2013-05-13 ENCOUNTER — Ambulatory Visit: Payer: Medicaid Other | Attending: Internal Medicine

## 2013-06-09 ENCOUNTER — Encounter (HOSPITAL_COMMUNITY): Payer: Self-pay | Admitting: Emergency Medicine

## 2013-06-09 ENCOUNTER — Emergency Department (HOSPITAL_COMMUNITY)
Admission: EM | Admit: 2013-06-09 | Discharge: 2013-06-09 | Disposition: A | Discharge status: Home / Self Care | Payer: No Typology Code available for payment source | Source: Home / Self Care | Attending: Emergency Medicine | Admitting: Emergency Medicine

## 2013-06-09 DIAGNOSIS — E119 Type 2 diabetes mellitus without complications: Secondary | ICD-10-CM

## 2013-06-09 LAB — POCT I-STAT, CHEM 8
BUN: 10 mg/dL (ref 6–23)
Calcium, Ion: 1.2 mmol/L (ref 1.12–1.23)
Chloride: 99 mEq/L (ref 96–112)
Creatinine, Ser: 0.5 mg/dL (ref 0.50–1.10)
Glucose, Bld: 234 mg/dL — ABNORMAL HIGH (ref 70–99)
HEMATOCRIT: 48 % — AB (ref 36.0–46.0)
HEMOGLOBIN: 16.3 g/dL — AB (ref 12.0–15.0)
POTASSIUM: 4.8 meq/L (ref 3.7–5.3)
SODIUM: 138 meq/L (ref 137–147)
TCO2: 28 mmol/L (ref 0–100)

## 2013-06-09 LAB — HEMOGLOBIN A1C
HEMOGLOBIN A1C: 8 % — AB (ref <5.7)
Mean Plasma Glucose: 183 mg/dL — ABNORMAL HIGH (ref <117)

## 2013-06-09 MED ORDER — METFORMIN HCL 1000 MG PO TABS: ORAL_TABLET | ORAL | Status: DC

## 2013-06-09 NOTE — Discharge Instructions (Signed)
Type 2 Diabetes Mellitus, Adult Type 2 diabetes mellitus, often simply referred to as type 2 diabetes, is a long-lasting (chronic) disease. In type 2 diabetes, the pancreas does not make enough insulin (a hormone), the cells are less responsive to the insulin that is made (insulin resistance), or both. Normally, insulin moves sugars from food into the tissue cells. The tissue cells use the sugars for energy. The lack of insulin or the lack of normal response to insulin causes excess sugars to build up in the blood instead of going into the tissue cells. As a result, high blood sugar (hyperglycemia) develops. The effect of high sugar (glucose) levels can cause many complications. Type 2 diabetes was also previously called adult-onset diabetes but it can occur at any age.  RISK FACTORS  A person is predisposed to developing type 2 diabetes if someone in the family has the disease and also has one or more of the following primary risk factors:  Overweight.  An inactive lifestyle.  A history of consistently eating high-calorie foods. Maintaining a normal weight and regular physical activity can reduce the chance of developing type 2 diabetes. SYMPTOMS  A person with type 2 diabetes may not show symptoms initially. The symptoms of type 2 diabetes appear slowly. The symptoms include:  Increased thirst (polydipsia).  Increased urination (polyuria).  Increased urination during the night (nocturia).  Weight loss. This weight loss may be rapid.  Frequent, recurring infections.  Tiredness (fatigue).  Weakness.  Vision changes, such as blurred vision.  Fruity smell to your breath.  Abdominal pain.  Nausea or vomiting.  Cuts or bruises which are slow to heal.  Tingling or numbness in the hands or feet. DIAGNOSIS Type 2 diabetes is frequently not diagnosed until complications of diabetes are present. Type 2 diabetes is diagnosed when symptoms or complications are present and when blood  glucose levels are increased. Your blood glucose level may be checked by one or more of the following blood tests:  A fasting blood glucose test. You will not be allowed to eat for at least 8 hours before a blood sample is taken.  A random blood glucose test. Your blood glucose is checked at any time of the day regardless of when you ate.  A hemoglobin A1c blood glucose test. A hemoglobin A1c test provides information about blood glucose control over the previous 3 months.  An oral glucose tolerance test (OGTT). Your blood glucose is measured after you have not eaten (fasted) for 2 hours and then after you drink a glucose-containing beverage. TREATMENT   You may need to take insulin or diabetes medicine daily to keep blood glucose levels in the desired range.  You will need to match insulin dosing with exercise and healthy food choices. The treatment goal is to maintain the before meal blood sugar (preprandial glucose) level at 70 130 mg/dL. HOME CARE INSTRUCTIONS   Have your hemoglobin A1c level checked twice a year.  Perform daily blood glucose monitoring as directed by your caregiver.  Monitor urine ketones when you are ill and as directed by your caregiver.  Take your diabetes medicine or insulin as directed by your caregiver to maintain your blood glucose levels in the desired range.  Never run out of diabetes medicine or insulin. It is needed every day.  Adjust insulin based on your intake of carbohydrates. Carbohydrates can raise blood glucose levels but need to be included in your diet. Carbohydrates provide vitamins, minerals, and fiber which are an essential part of   a healthy diet. Carbohydrates are found in fruits, vegetables, whole grains, dairy products, legumes, and foods containing added sugars.    Eat healthy foods. Alternate 3 meals with 3 snacks.  Lose weight if overweight.  Carry a medical alert card or wear your medical alert jewelry.  Carry a 15 gram  carbohydrate snack with you at all times to treat low blood glucose (hypoglycemia). Some examples of 15 gram carbohydrate snacks include:  Glucose tablets, 3 or 4   Glucose gel, 15 gram tube  Raisins, 2 tablespoons (24 grams)  Jelly beans, 6  Animal crackers, 8  Regular pop, 4 ounces (120 mL)  Gummy treats, 9  Recognize hypoglycemia. Hypoglycemia occurs with blood glucose levels of 70 mg/dL and below. The risk for hypoglycemia increases when fasting or skipping meals, during or after intense exercise, and during sleep. Hypoglycemia symptoms can include:  Tremors or shakes.  Decreased ability to concentrate.  Sweating.  Increased heart rate.  Headache.  Dry mouth.  Hunger.  Irritability.  Anxiety.  Restless sleep.  Altered speech or coordination.  Confusion.  Treat hypoglycemia promptly. If you are alert and able to safely swallow, follow the 15:15 rule:  Take 15 20 grams of rapid-acting glucose or carbohydrate. Rapid-acting options include glucose gel, glucose tablets, or 4 ounces (120 mL) of fruit juice, regular soda, or low fat milk.  Check your blood glucose level 15 minutes after taking the glucose.  Take 15 20 grams more of glucose if the repeat blood glucose level is still 70 mg/dL or below.  Eat a meal or snack within 1 hour once blood glucose levels return to normal.    Be alert to polyuria and polydipsia which are early signs of hyperglycemia. An early awareness of hyperglycemia allows for prompt treatment. Treat hyperglycemia as directed by your caregiver.  Engage in at least 150 minutes of moderate-intensity physical activity a week, spread over at least 3 days of the week or as directed by your caregiver. In addition, you should engage in resistance exercise at least 2 times a week or as directed by your caregiver.  Adjust your medicine and food intake as needed if you start a new exercise or sport.  Follow your sick day plan at any time you  are unable to eat or drink as usual.  Avoid tobacco use.  Limit alcohol intake to no more than 1 drink per day for nonpregnant women and 2 drinks per day for men. You should drink alcohol only when you are also eating food. Talk with your caregiver whether alcohol is safe for you. Tell your caregiver if you drink alcohol several times a week.  Follow up with your caregiver regularly.  Schedule an eye exam soon after the diagnosis of type 2 diabetes and then annually.  Perform daily skin and foot care. Examine your skin and feet daily for cuts, bruises, redness, nail problems, bleeding, blisters, or sores. A foot exam by a caregiver should be done annually.  Brush your teeth and gums at least twice a day and floss at least once a day. Follow up with your dentist regularly.  Share your diabetes management plan with your workplace or school.  Stay up-to-date with immunizations.  Learn to manage stress.  Obtain ongoing diabetes education and support as needed.  Participate in, or seek rehabilitation as needed to maintain or improve independence and quality of life. Request a physical or occupational therapy referral if you are having foot or hand numbness or difficulties with grooming,   dressing, eating, or physical activity. SEEK MEDICAL CARE IF:   You are unable to eat food or drink fluids for more than 6 hours.  You have nausea and vomiting for more than 6 hours.  Your blood glucose level is over 240 mg/dL.  There is a change in mental status.  You develop an additional serious illness.  You have diarrhea for more than 6 hours.  You have been sick or have had a fever for a couple of days and are not getting better.  You have pain during any physical activity.  SEEK IMMEDIATE MEDICAL CARE IF:  You have difficulty breathing.  You have moderate to large ketone levels. MAKE SURE YOU:  Understand these instructions.  Will watch your condition.  Will get help right away if  you are not doing well or get worse. Document Released: 03/20/2005 Document Revised: 12/13/2011 Document Reviewed: 10/17/2011 Exit[employer] Patient Information 2014 [employer]. Diabetes Meal Planning Guide The diabetes meal planning guide is a tool to help you plan your meals and snacks. It is important for people with diabetes to manage their blood glucose (sugar) levels. Choosing the right foods and the right amounts throughout your day will help control your blood glucose. Eating right can even help you improve your blood pressure and reach or maintain a healthy weight. CARBOHYDRATE COUNTING MADE EASY When you eat carbohydrates, they turn to sugar. This raises your blood glucose level. Counting carbohydrates can help you control this level so you feel better. When you plan your meals by counting carbohydrates, you can have more flexibility in what you eat and balance your medicine with your food intake. Carbohydrate counting simply means adding up the total amount of carbohydrate grams in your meals and snacks. Try to eat about the same amount at each meal. Foods with carbohydrates are listed below. Each portion below is 1 carbohydrate serving or 15 grams of carbohydrates. Ask your dietician how many grams of carbohydrates you should eat at each meal or snack. Grains and Starches  1 slice bread.   English muffin or hotdog/hamburger bun.   cup cold cereal (unsweetened).   cup cooked pasta or rice.   cup starchy vegetables (corn, potatoes, peas, beans, winter squash).  1 tortilla (6 inches).   bagel.  1 waffle or pancake (size of a CD).   cup cooked cereal.  4 to 6 small crackers. *Whole grain is recommended. Fruit  1 cup fresh unsweetened berries, melon, papaya, pineapple.  1 small fresh fruit.   banana or mango.   cup fruit juice (4 oz unsweetened).   cup canned fruit in natural juice or water.  2 tbs dried fruit.  12 to 15 grapes or cherries. Milk and  Yogurt  1 cup fat-free or 1% milk.  1 cup soy milk.  6 oz light yogurt with sugar-free sweetener.  6 oz low-fat soy yogurt.  6 oz plain yogurt. Vegetables  1 cup raw or  cup cooked is counted as 0 carbohydrates or a "free" food.  If you eat 3 or more servings at 1 meal, count them as 1 carbohydrate serving. Other Carbohydrates   oz chips or pretzels.   cup ice cream or frozen yogurt.   cup sherbet or sorbet.  2 inch square cake, no frosting.  1 tbs honey, sugar, jam, jelly, or syrup.  2 small cookies.  3 squares of graham crackers.  3 cups popcorn.  6 crackers.  1 cup broth-based soup.  Count 1 cup casserole or other   mixed foods as 2 carbohydrate servings.  Foods with less than 20 calories in a serving may be counted as 0 carbohydrates or a "free" food. You may want to purchase a book or computer software that lists the carbohydrate gram counts of different foods. In addition, the nutrition facts panel on the labels of the foods you eat are a good source of this information. The label will tell you how big the serving size is and the total number of carbohydrate grams you will be eating per serving. Divide this number by 15 to obtain the number of carbohydrate servings in a portion. Remember, 1 carbohydrate serving equals 15 grams of carbohydrate. SERVING SIZES Measuring foods and serving sizes helps you make sure you are getting the right amount of food. The list below tells how big or small some common serving sizes are.  1 oz.........4 stacked dice.  3 oz.........Deck of cards.  1 tsp........Tip of little finger.  1 tbs........Thumb.  2 tbs........Golf ball.   cup.......Half of a fist.  1 cup........A fist. SAMPLE DIABETES MEAL PLAN Below is a sample meal plan that includes foods from the grain and starches, dairy, vegetable, fruit, and meat groups. A dietician can individualize a meal plan to fit your calorie needs and tell you the number of  servings needed from each food group. However, controlling the total amount of carbohydrates in your meal or snack is more important than making sure you include all of the food groups at every meal. You may interchange carbohydrate containing foods (dairy, starches, and fruits). The meal plan below is an example of a 2000 calorie diet using carbohydrate counting. This meal plan has 17 carbohydrate servings. Breakfast  1 cup oatmeal (2 carb servings).   cup light yogurt (1 carb serving).  1 cup blueberries (1 carb serving).   cup almonds. Snack  1 large apple (2 carb servings).  1 low-fat string cheese stick. Lunch  Chicken breast salad.  1 cup spinach.   cup chopped tomatoes.  2 oz chicken breast, sliced.  2 tbs low-fat Italian dressing.  12 whole-wheat crackers (2 carb servings).  12 to 15 grapes (1 carb serving).  1 cup low-fat milk (1 carb serving). Snack  1 cup carrots.   cup hummus (1 carb serving). Dinner  3 oz broiled salmon.  1 cup brown rice (3 carb servings). Snack  1  cups steamed broccoli (1 carb serving) drizzled with 1 tsp olive oil and lemon juice.  1 cup light pudding (2 carb servings). DIABETES MEAL PLANNING WORKSHEET Your dietician can use this worksheet to help you decide how many servings of foods and what types of foods are right for you.  BREAKFAST Food Group and Servings / Carb Servings Grain/Starches __________________________________ Dairy __________________________________________ Vegetable ______________________________________ Fruit ___________________________________________ Meat __________________________________________ Fat ____________________________________________ LUNCH Food Group and Servings / Carb Servings Grain/Starches ___________________________________ Dairy ___________________________________________ Fruit ____________________________________________ Meat ___________________________________________ Fat  _____________________________________________ DINNER Food Group and Servings / Carb Servings Grain/Starches ___________________________________ Dairy ___________________________________________ Fruit ____________________________________________ Meat ___________________________________________ Fat _____________________________________________ SNACKS Food Group and Servings / Carb Servings Grain/Starches ___________________________________ Dairy ___________________________________________ Vegetable _______________________________________ Fruit ____________________________________________ Meat ___________________________________________ Fat _____________________________________________ DAILY TOTALS Starches _________________________ Vegetable ________________________ Fruit ____________________________ Dairy ____________________________ Meat ____________________________ Fat ______________________________ Document Released: 12/15/2004 Document Revised: 06/12/2011 Document Reviewed: 10/26/2008 [employer] Patient Information 2014 [employer].  

## 2013-06-09 NOTE — ED Notes (Signed)
States she had Dm during her pregnancy, and was taking PO medication for same. Delivered 6 months ago, and is no longer taking medication for her DM, but is continuing to check her BS, which has been high. C/o  UA frequency, thirst

## 2013-06-09 NOTE — ED Notes (Addendum)
Hgb A1C 8.0 H, mean glucose 183 H.  Message sent to Dr. Lorenz CoasterKeller. Norma Chambers, Norma Chambers 06/09/2013

## 2013-06-09 NOTE — ED Provider Notes (Signed)
Chief Complaint   Chief Complaint  Patient presents with  . Hyperglycemia    History of Present Illness   Norma Chambers is a 43 year old female who had gestational diabetes with her first pregnancy. She delivered her baby 6 months ago. Her child weighed 11 pounds at birth. She was on metformin and glyburide during the pregnancy. Her sugars were under good control at that time. The medications were stopped after she delivered. She's concerned her sugars are back up again. She's had some excessive thirst, frequent urination, fatigue, tiredness, and blurry vision. Her weight has been going up since she delivered the baby. Her blood sugars have ranged from 230-250. She has bilateral foot pain. She denies any shortness of breath, chest pain, tightness, or pressure, vomiting or diarrhea, or extremity paresthesias, swelling, or ulcerations. She's had no history of kidney disease, heart disease, high blood pressure, elevated cholesterol. She does not smoke cigarettes. She is breast-feeding.  Review of Systems   Other than as noted above, the patient denies any of the following symptoms. Systemic:  No fever, chills, fatigue, weight loss or gain. Eye:  No blurred vision or diplopia. Lungs:  No cough, wheezing, or shortness of breath. Heart:  No chest pain, tightness, pressure, palpitation, dizziness, syncope, or edema. Abdomen:  No abdominal pain, nausea, vomiting or diarrrhea. GU:  No dysuria, frequency, urgency, hematuria. Ext:  No pain, paresthesias, swelling, or ulcerations. Endocrine:  No polyuria, polydipsia, heat or cold intolerance. Skin:  No rash or itching. Neuro:  No focal weakness or numbness.  PMFSH   Past medical history, family history, social history, meds, and allergies were reviewed.   Physical Examination    Vital signs:  BP 151/69  Pulse 95  Temp(Src) 99.3 F (37.4 C) (Oral)  Resp 16  SpO2 99% Gen:  Alert, oriented, in no distress. Eye:  PERRL, full EOM, lids  conjunctivas, and sclera unremarkable. ENT:  TMs and canals normal.  Mucous membranes moist.  No acetone odor.  Pharynx clear.   Neck:  Supple, full ROM, no adenopathy or tenderness.  No JVD. Lungs:  Clear to auscultation.  No wheezes, rales or rhonchi. Heart:  Regular rhythm.  No gallops or murmers. Abdomen:  Soft, flat, non-distended, nontener.  No hepato-splenomegaly or mass.  Bowel sounds normal.  No pulsatile midline mass or bruit. Ext:  No edema, pulses full.  No ulceration or skin lesions. Skin:  Clear, warm and dry.  No rash or lesions. Neuro:  Alert and oriented times 3.  No focal weakness.  Speech normal.  CNs intact.  Labs   Results for orders placed during the hospital encounter of 06/09/13  POCT I-STAT, CHEM 8      Result Value Ref Range   Sodium 138  137 - 147 mEq/L   Potassium 4.8  3.7 - 5.3 mEq/L   Chloride 99  96 - 112 mEq/L   BUN 10  6 - 23 mg/dL   Creatinine, Ser 1.61  0.50 - 1.10 mg/dL   Glucose, Bld 096 (*) 70 - 99 mg/dL   Calcium, Ion 0.45  1.12 - 1.23 mmol/L   TCO2 28  0 - 100 mmol/L   Hemoglobin 16.3 (*) 12.0 - 15.0 g/dL   HCT 40.9 (*) 81.1 - 91.4 %    A hemoglobin A1c was obtained as a baseline.  Assessment   The encounter diagnosis was Type 2 diabetes mellitus.  Discussed diet and she was given some dietary information. I think she should followup and was given  [facility]  Plan     1.  Meds:  The following meds were prescribed:   Discharge Medication List as of 06/09/2013 12:09 PM    START taking these medications   Details  metFORMIN (GLUCOPHAGE) 1000 MG tablet 1/2 tablet BID with meals for 3 days, then 1 BID, Normal        2.  Patient Education/Counseling:  The patient was given appropriate handouts, self care instructions, and instructed in symptomatic relief.    3.  Follow up:  The patient was told to follow up here if no better in 3 to 4 days, or sooner if becoming worse in any way, and  given some red flag symptoms such as fever, persistent vomiting, abdominal pain, chest pain, or shortness of breath which would prompt immediate return.  [facility]      Reuben Likesavid C Latoria Dry, MD 06/09/13 [phone]

## 2013-06-10 ENCOUNTER — Telehealth (HOSPITAL_COMMUNITY): Payer: Self-pay | Admitting: *Deleted

## 2013-06-10 NOTE — ED Notes (Signed)
Dr. Lorenz CoasterKeller wants pt. informed of result, to continue Metformin and to f/u with PCP in 3 mos. Tell pt. target Hgb A1 C is 7.0.  I called pt. and left a message to call. Call 1. Norma Chambers, Norma Chambers 06/10/2013

## 2013-06-15 NOTE — ED Notes (Signed)
I called pt.  Pt. verified x 2 and given results.  Pt. passed the phone to her husband.  I gave him the instructions from Dr. Lorenz CoasterKeller and he said he is [facility]  She has the orange card. Call 3. Vassie MoselleYork, Larnell Granlund M 06/15/2013

## 2013-07-09 ENCOUNTER — Ambulatory Visit: Payer: No Typology Code available for payment source | Attending: Internal Medicine | Admitting: Internal Medicine

## 2013-07-09 ENCOUNTER — Encounter: Payer: Self-pay | Admitting: Internal Medicine

## 2013-07-09 VITALS — BP 121/84 | HR 100 | Temp 98.0°F | Resp 16 | Ht 63.0 in | Wt 298.0 lb

## 2013-07-09 DIAGNOSIS — E1310 Other specified diabetes mellitus with ketoacidosis without coma: Secondary | ICD-10-CM | POA: Insufficient documentation

## 2013-07-09 DIAGNOSIS — E669 Obesity, unspecified: Secondary | ICD-10-CM

## 2013-07-09 DIAGNOSIS — E1110 Type 2 diabetes mellitus with ketoacidosis without coma: Secondary | ICD-10-CM

## 2013-07-09 DIAGNOSIS — E119 Type 2 diabetes mellitus without complications: Secondary | ICD-10-CM

## 2013-07-09 DIAGNOSIS — Z139 Encounter for screening, unspecified: Secondary | ICD-10-CM

## 2013-07-09 LAB — GLUCOSE, POCT (MANUAL RESULT ENTRY): POC Glucose: 199 mg/dl — AB (ref 70–99)

## 2013-07-09 MED ORDER — GLUCOSE BLOOD VI STRP: ORAL_STRIP | Status: DC

## 2013-07-09 MED ORDER — FREESTYLE SYSTEM KIT: 1.0000 | PACK | Freq: Three times a day (TID) | Status: DC

## 2013-07-09 NOTE — Patient Instructions (Signed)

## 2013-07-09 NOTE — Progress Notes (Signed)
Pt is here to establish care. Pt has a history of diabetes.  

## 2013-07-09 NOTE — Progress Notes (Signed)
Patient Demographics  Norma Chambers, is a 43 y.o. female  GEZ:[id number]  YTK:[id number]  DOB - 1970-04-19  CC:  Chief Complaint  Patient presents with  . Establish Care       HPI: Norma Chambers is a 43 y.o. female here today to establish medical care. Patient has history of diabetes and is taking metformin, denies any hypoglycemic symptoms, she does not have a glucometer at home to check blood sugar, denies any headache dizziness chest and shortness of breath. Patient has No headache, No chest pain, No abdominal pain - No Nausea, No new weakness tingling or numbness, No Cough - SOB.  No Known Allergies Past Medical History  Diagnosis Date  . Gestational diabetes    Current Outpatient Prescriptions on File Prior to Visit  Medication Sig Dispense Refill  . metFORMIN (GLUCOPHAGE) 1000 MG tablet 1/2 tablet BID with meals for 3 days, then 1 BID  60 tablet  2  . amoxicillin-clavulanate (AUGMENTIN) 875-125 MG per tablet Take 1 tablet by mouth every 12 (twelve) hours.  14 tablet  0  . glucose blood test strip Use as instructed- test blood sugar 4 times daily  50 each  12  . ibuprofen (ADVIL,MOTRIN) 600 MG tablet Take 1 tablet (600 mg total) by mouth every 6 (six) hours.  30 tablet  0  . oxyCODONE-acetaminophen (PERCOCET/ROXICET) 5-325 MG per tablet Take 1-2 tablets by mouth every 4 (four) hours as needed.  30 tablet  0  . Prenatal Vit-Fe Fumarate-FA (MULTIVITAMIN-PRENATAL) 27-0.8 MG TABS Take 1 tablet by mouth daily at 12 noon.       No current facility-administered medications on file prior to visit.   Family History  Problem Relation Age of Onset  . Diabetes Mother   . Diabetes Maternal Grandmother    History   Social History  . Marital Status: Married    Spouse Name: N/A    Number of Children: N/A  . Years of Education: N/A   Occupational History  . Not on file.   Social History Main Topics  . Smoking status: Never Smoker   . Smokeless tobacco: Never Used  .  Alcohol Use: No  . Drug Use: No  . Sexual Activity: Yes   Other Topics Concern  . Not on file   Social History Narrative  . No narrative on file    Review of Systems: Constitutional: Negative for fever, chills, diaphoresis, activity change, appetite change and fatigue. HENT: Negative for ear pain, nosebleeds, congestion, facial swelling, rhinorrhea, neck pain, neck stiffness and ear discharge.  Eyes: Negative for pain, discharge, redness, itching and visual disturbance. Respiratory: Negative for cough, choking, chest tightness, shortness of breath, wheezing and stridor.  Cardiovascular: Negative for chest pain, palpitations and leg swelling. Gastrointestinal: Negative for abdominal distention. Genitourinary: Negative for dysuria, urgency, frequency, hematuria, flank pain, decreased urine volume, difficulty urinating and dyspareunia.  Musculoskeletal: Negative for back pain, joint swelling, arthralgia and gait problem. Neurological: Negative for dizziness, tremors, seizures, syncope, facial asymmetry, speech difficulty, weakness, light-headedness, numbness and headaches.  Hematological: Negative for adenopathy. Does not bruise/bleed easily. Psychiatric/Behavioral: Negative for hallucinations, behavioral problems, confusion, dysphoric mood, decreased concentration and agitation.    Objective:   Filed Vitals:   07/09/13 1531  BP: 121/84  Pulse: 100  Temp: 98 F (36.7 C)  Resp: 16    Physical Exam: Constitutional: Patient appears well-developed and well-nourished. No distress. HENT: Normocephalic, atraumatic, External right and left ear normal. Oropharynx is clear and moist.  Eyes:  Conjunctivae and EOM are normal. PERRLA, no scleral icterus. Neck: Normal ROM. Neck supple. No JVD. No tracheal deviation. No thyromegaly. CVS: RRR, S1/S2 +, no murmurs, no gallops, no carotid bruit.  Pulmonary: Effort and breath sounds normal, no stridor, rhonchi, wheezes, rales.  Abdominal: Soft.  BS +, no distension, tenderness, rebound or guarding.  Musculoskeletal: Normal range of motion. No edema and no tenderness.  Neuro: Alert. Normal reflexes, muscle tone coordination. No cranial nerve deficit. Skin: Skin is warm and dry. No rash noted. Not diaphoretic. No erythema. No pallor. Psychiatric: Normal mood and affect. Behavior, judgment, thought content normal.  Lab Results  Component Value Date   WBC 11.9* 11/19/2012   HGB 16.3* 06/09/2013   HCT 48.0* 06/09/2013   MCV 86.0 11/19/2012   PLT 222 11/19/2012   Lab Results  Component Value Date   CREATININE 0.50 06/09/2013   BUN 10 06/09/2013   NA 138 06/09/2013   K 4.8 06/09/2013   CL 99 06/09/2013   CO2 22 11/14/2012    Lab Results  Component Value Date   HGBA1C 8.0* 06/09/2013   Lipid Panel  No results found for this basename: chol, trig, hdl, cholhdl, vldl, ldlcalc       Assessment and plan:   1. DM (diabetes mellitus) type 2,  - Glucose (CBG) Results for orders placed in visit on 07/09/13  GLUCOSE, POCT (MANUAL RESULT ENTRY)      Result Value Ref Range   POC Glucose 199 (*) 70 - 99 mg/dl   last hemoglobin A1c was 8.0% High Have advised patient to modify the diet we'll continue with metformin, prescribed glucometer to check her fingerstick sugar at home. - glucose monitoring kit (FREESTYLE) monitoring kit; 1 each by Does not apply route 4 (four) times daily - after meals and at bedtime. 1 month Diabetic Testing Supplies for QAC-QHS accuchecks.  Dispense: 1 each; Refill: 1 - COMPLETE METABOLIC PANEL WITH GFR; Future  2. Obesity, unspecified Diet and exercise  3. Screening Baseline fasting blood work  - Vit D  25 hydroxy (rtn osteoporosis monitoring); Future - TSH; Future - Lipid panel; Future - CBC with Differential; Future   Return in about 3 months (around 10/08/2013) for diabetes.   Lorayne Marek, MD

## 2013-07-10 DIAGNOSIS — E119 Type 2 diabetes mellitus without complications: Secondary | ICD-10-CM | POA: Insufficient documentation

## 2013-07-10 DIAGNOSIS — E669 Obesity, unspecified: Secondary | ICD-10-CM | POA: Insufficient documentation

## 2013-09-15 ENCOUNTER — Other Ambulatory Visit: Payer: Self-pay | Admitting: Emergency Medicine

## 2013-09-15 MED ORDER — METFORMIN HCL 1000 MG PO TABS: ORAL_TABLET | ORAL | Status: DC

## 2013-10-08 ENCOUNTER — Ambulatory Visit: Payer: No Typology Code available for payment source | Admitting: Internal Medicine

## 2013-10-27 ENCOUNTER — Encounter: Payer: Self-pay | Admitting: Internal Medicine

## 2013-10-27 ENCOUNTER — Ambulatory Visit: Payer: No Typology Code available for payment source | Attending: Internal Medicine | Admitting: Internal Medicine

## 2013-10-27 VITALS — BP 124/82 | HR 98 | Temp 98.5°F | Resp 16 | Wt 296.6 lb

## 2013-10-27 DIAGNOSIS — N644 Mastodynia: Secondary | ICD-10-CM

## 2013-10-27 DIAGNOSIS — E139 Other specified diabetes mellitus without complications: Secondary | ICD-10-CM

## 2013-10-27 DIAGNOSIS — E089 Diabetes mellitus due to underlying condition without complications: Secondary | ICD-10-CM

## 2013-10-27 DIAGNOSIS — Z139 Encounter for screening, unspecified: Secondary | ICD-10-CM

## 2013-10-27 LAB — POCT GLYCOSYLATED HEMOGLOBIN (HGB A1C): HEMOGLOBIN A1C: 7.5

## 2013-10-27 LAB — GLUCOSE, POCT (MANUAL RESULT ENTRY): POC GLUCOSE: 233 mg/dL — AB (ref 70–99)

## 2013-10-27 MED ORDER — GLIPIZIDE 5 MG PO TABS: 5.0000 mg | ORAL_TABLET | Freq: Two times a day (BID) | ORAL | Status: DC

## 2013-10-27 NOTE — Progress Notes (Signed)
Patient here for follow up on her diabetes Complains of pain to her left breast the past two weeks

## 2013-10-27 NOTE — Progress Notes (Signed)
MRN: [id number] Name: Norma Chambers  Sex: female Age: 43 y.o. DOB: May 25, 1970  Allergies: Review of patient's allergies indicates no known allergies.  Chief Complaint  Patient presents with  . Follow-up    HPI: Patient is 43 y.o. female who has history of diabetes comes today for followup, she has been taking metformin 1 g twice a day, denies any hypoglycemic symptoms, had A1c slightly improved compared to last visit it is 7.5%, she also reported to have on and off pain in her left breast denies any fever chills any nipple discharge, she's not breast-feeding.   Past Medical History  Diagnosis Date  . Gestational diabetes     Past Surgical History  Procedure Laterality Date  . Cesarean section    . Cesarean section N/A 11/18/2012    Procedure: REPEAT CESAREAN SECTION;  Surgeon: Mora Bellman, MD;  Location: [facility];  Service: Obstetrics;  Laterality: N/A;      Medication List       This list is accurate as of: 10/27/13  5:23 PM.  Always use your most recent med list.               amoxicillin-clavulanate 875-125 MG per tablet  Commonly known as:  AUGMENTIN  Take 1 tablet by mouth every 12 (twelve) hours.     glipiZIDE 5 MG tablet  Commonly known as:  GLUCOTROL  Take 1 tablet (5 mg total) by mouth 2 (two) times daily before a meal.     glucose blood test strip  Use as instructed- test blood sugar 4 times daily     glucose blood test strip  Commonly known as:  CHOICE DM FORA G20 TEST STRIPS  Use as instructed     glucose monitoring kit monitoring kit  1 each by Does not apply route 4 (four) times daily - after meals and at bedtime. 1 month Diabetic Testing Supplies for QAC-QHS accuchecks.     ibuprofen 600 MG tablet  Commonly known as:  ADVIL,MOTRIN  Take 1 tablet (600 mg total) by mouth every 6 (six) hours.     metFORMIN 1000 MG tablet  Commonly known as:  GLUCOPHAGE  1/2 tablet BID with meals for 3 days, then 1 BID     multivitamin-prenatal 27-0.8 MG  Tabs tablet  Take 1 tablet by mouth daily at 12 noon.     oxyCODONE-acetaminophen 5-325 MG per tablet  Commonly known as:  PERCOCET/ROXICET  Take 1-2 tablets by mouth every 4 (four) hours as needed.        Meds ordered this encounter  Medications  . glipiZIDE (GLUCOTROL) 5 MG tablet    Sig: Take 1 tablet (5 mg total) by mouth 2 (two) times daily before a meal.    Dispense:  60 tablet    Refill:  3    Immunization History  Administered Date(s) Administered  . Tdap 09/30/2012    Family History  Problem Relation Age of Onset  . Diabetes Mother   . Diabetes Maternal Grandmother     History  Substance Use Topics  . Smoking status: Never Smoker   . Smokeless tobacco: Never Used  . Alcohol Use: No    Review of Systems   As noted in HPI  Filed Vitals:   10/27/13 1702  BP: 124/82  Pulse: 98  Temp: 98.5 F (36.9 C)  Resp: 16    Physical Exam  Physical Exam  Constitutional:  Obese female sitting comfortably not in acute distress  Cardiovascular: Normal rate  and regular rhythm.   Pulmonary/Chest: Breath sounds normal. No respiratory distress. She has no wheezes. She has no rales.  Patient examined in the presence of medical staff as chaperone, left breast no erythema no tenderness no lumps, no nipple discharge    CBC    Component Value Date/Time   WBC 11.9* 11/19/2012 0546   RBC 3.63* 11/19/2012 0546   HGB 16.3* 06/09/2013 1158   HGB 11.9 08/05/2012   HCT 48.0* 06/09/2013 1158   HCT 36 08/05/2012   PLT 222 11/19/2012 0546   PLT 262 08/05/2012   MCV 86.0 11/19/2012 0546    CMP     Component Value Date/Time   NA 138 06/09/2013 1158   K 4.8 06/09/2013 1158   CL 99 06/09/2013 1158   CO2 22 11/14/2012 1114   GLUCOSE 234* 06/09/2013 1158   BUN 10 06/09/2013 1158   CREATININE 0.50 06/09/2013 1158   CALCIUM 9.2 11/14/2012 1114   GFRNONAA >90 11/14/2012 1114   GFRAA >90 11/14/2012 1114    No results found for this basename: chol, tri, ldl    No components found with this  basename: hga1c    No results found for this basename: AST    Assessment and Plan  Diabetes mellitus due to underlying condition without complications - Plan:  Results for orders placed in visit on 10/27/13  GLUCOSE, POCT (MANUAL RESULT ENTRY)      Result Value Ref Range   POC Glucose 233 (*) 70 - 99 mg/dl  POCT GLYCOSYLATED HEMOGLOBIN (HGB A1C)      Result Value Ref Range   Hemoglobin A1C 7.5     A1c is improving, I have advised patient to continue with diet modification, she'll continue with metformin 1 g twice a day, I have added, glipiZIDE (GLUCOTROL) 5 MG tablet to take 2 times a day, advised to keep the fingerstick log. Will repeat A1c in 3 months.  Pain of left breast Physical examination is benign, have ordered mammogram.  Screening - Plan: [facility] Digital Diagnostic Bilat   Health Maintenance  -Mammogram: ordered    Return in about 3 months (around 01/27/2014).  Lorayne Marek, MD

## 2013-11-25 ENCOUNTER — Ambulatory Visit: Payer: Self-pay

## 2014-01-01 ENCOUNTER — Ambulatory Visit: Payer: Self-pay | Attending: Internal Medicine

## 2014-02-02 ENCOUNTER — Encounter: Payer: Self-pay | Admitting: Internal Medicine

## 2014-02-24 ENCOUNTER — Other Ambulatory Visit: Payer: Self-pay | Admitting: Internal Medicine

## 2014-03-03 ENCOUNTER — Ambulatory Visit: Payer: Self-pay | Admitting: Internal Medicine

## 2014-06-08 ENCOUNTER — Other Ambulatory Visit: Payer: Self-pay | Admitting: Internal Medicine

## 2014-06-11 ENCOUNTER — Encounter (HOSPITAL_COMMUNITY): Payer: Self-pay | Admitting: Emergency Medicine

## 2014-06-11 ENCOUNTER — Emergency Department (INDEPENDENT_AMBULATORY_CARE_PROVIDER_SITE_OTHER)
Admission: EM | Admit: 2014-06-11 | Discharge: 2014-06-11 | Disposition: A | Discharge status: Home / Self Care | Payer: Self-pay | Source: Home / Self Care | Attending: Family Medicine | Admitting: Family Medicine

## 2014-06-11 DIAGNOSIS — J020 Streptococcal pharyngitis: Secondary | ICD-10-CM

## 2014-06-11 DIAGNOSIS — R739 Hyperglycemia, unspecified: Secondary | ICD-10-CM

## 2014-06-11 LAB — POCT RAPID STREP A: Streptococcus, Group A Screen (Direct): POSITIVE — AB

## 2014-06-11 LAB — POCT I-STAT, CHEM 8
BUN: 7 mg/dL (ref 6–23)
CALCIUM ION: 1.17 mmol/L (ref 1.12–1.23)
CHLORIDE: 98 mmol/L (ref 96–112)
Creatinine, Ser: 0.6 mg/dL (ref 0.50–1.10)
GLUCOSE: 209 mg/dL — AB (ref 70–99)
HEMATOCRIT: 47 % — AB (ref 36.0–46.0)
Hemoglobin: 16 g/dL — ABNORMAL HIGH (ref 12.0–15.0)
Potassium: 4 mmol/L (ref 3.5–5.1)
SODIUM: 136 mmol/L (ref 135–145)
TCO2: 24 mmol/L (ref 0–100)

## 2014-06-11 LAB — POCT URINALYSIS DIP (DEVICE)
Bilirubin Urine: NEGATIVE
Glucose, UA: 500 mg/dL — AB
HGB URINE DIPSTICK: NEGATIVE
Ketones, ur: NEGATIVE mg/dL
LEUKOCYTES UA: NEGATIVE
NITRITE: NEGATIVE
PH: 5.5 (ref 5.0–8.0)
PROTEIN: 30 mg/dL — AB
Specific Gravity, Urine: >=1.03 (ref 1.005–1.030)
Urobilinogen, UA: 1 mg/dL (ref 0.0–1.0)

## 2014-06-11 LAB — POCT PREGNANCY, URINE: Preg Test, Ur: NEGATIVE

## 2014-06-11 MED ORDER — PENICILLIN G BENZATHINE 1200000 UNIT/2ML IM SUSP
1.2000 10*6.[IU] | Freq: Once | INTRAMUSCULAR | Status: AC
  Administered 2014-06-11: 1.2 10*6.[IU] via INTRAMUSCULAR

## 2014-06-11 MED ORDER — PENICILLIN G BENZATHINE 1200000 UNIT/2ML IM SUSP
INTRAMUSCULAR | Status: AC
  Filled 2014-06-11: qty 2

## 2014-06-11 NOTE — ED Provider Notes (Signed)
CSN: [id number]     Arrival date & time 06/11/14  5852 History   First MD Initiated Contact with Patient 06/11/14 [phone]     Chief Complaint  Patient presents with  . Sore Throat  . Fever   (Consider location/radiation/quality/duration/timing/severity/associated sxs/prior Treatment) HPI Comments: States blood glucose levels at home have been elevated PCP: [place] No flu shot this season  Patient is a 44 y.o. female presenting with URI. The history is provided by the patient.  URI Presenting symptoms: cough, fatigue, fever and sore throat   Presenting symptoms: no ear pain and no facial pain   Severity:  Moderate Onset quality:  Gradual Duration:  2 days Timing:  Constant Progression:  Worsening Chronicity:  New Associated symptoms: myalgias   Associated symptoms: no wheezing   Associated symptoms comment:  +dizziness   Past Medical History  Diagnosis Date  . Gestational diabetes    Past Surgical History  Procedure Laterality Date  . Cesarean section    . Cesarean section N/A 11/18/2012    Procedure: REPEAT CESAREAN SECTION;  Surgeon: Mora Bellman, MD;  Location: [facility];  Service: Obstetrics;  Laterality: N/A;   Family History  Problem Relation Age of Onset  . Diabetes Mother   . Diabetes Maternal Grandmother    History  Substance Use Topics  . Smoking status: Never Smoker   . Smokeless tobacco: Never Used  . Alcohol Use: No   OB History    Gravida Para Term Preterm AB TAB SAB Ectopic Multiple Living   3 3 3       3      Review of Systems  Constitutional: Positive for fever, chills and fatigue.  HENT: Positive for sore throat. Negative for ear pain.   Eyes: Negative.   Respiratory: Positive for cough. Negative for chest tightness, shortness of breath and wheezing.   Cardiovascular: Negative.   Gastrointestinal: Negative.   Endocrine: Negative for polydipsia, polyphagia and polyuria.  Genitourinary: Negative for dysuria and difficulty urinating.   Musculoskeletal: Positive for myalgias. Negative for back pain.  Skin: Negative.     Allergies  Review of patient's allergies indicates no known allergies.  Home Medications   Prior to Admission medications   Medication Sig Start Date End Date Taking? Authorizing Provider  metFORMIN (GLUCOPHAGE) 1000 MG tablet TAKE 1 TABLET TWICE DAILY WITH MEALS 03/23/14  Yes Lorayne Marek, MD  amoxicillin-clavulanate (AUGMENTIN) 875-125 MG per tablet Take 1 tablet by mouth every 12 (twelve) hours. 12/01/12   Freeman Caldron Baker, PA-C  glipiZIDE (GLUCOTROL) 5 MG tablet Take 1 tablet (5 mg total) by mouth 2 (two) times daily before a meal. 10/27/13   Lorayne Marek, MD  glucose blood (CHOICE DM FORA G20 TEST STRIPS) test strip Use as instructed 07/09/13   Lorayne Marek, MD  glucose blood test strip Use as instructed- test blood sugar 4 times daily 09/10/12   Osborne Oman, MD  glucose monitoring kit (FREESTYLE) monitoring kit 1 each by Does not apply route 4 (four) times daily - after meals and at bedtime. 1 month Diabetic Testing Supplies for QAC-QHS accuchecks. 07/09/13   Lorayne Marek, MD  ibuprofen (ADVIL,MOTRIN) 600 MG tablet Take 1 tablet (600 mg total) by mouth every 6 (six) hours. 11/21/12   Kassie Mends, MD  oxyCODONE-acetaminophen (PERCOCET/ROXICET) 5-325 MG per tablet Take 1-2 tablets by mouth every 4 (four) hours as needed. 11/21/12   Kassie Mends, MD  Prenatal Vit-Fe Fumarate-FA (MULTIVITAMIN-PRENATAL) 27-0.8 MG TABS Take 1 tablet by mouth daily  at 12 noon.    Historical Provider, MD   BP 131/86 mmHg  Pulse 109  Temp(Src) 100.4 F (38 C) (Oral)  Resp 26  SpO2 94%  LMP 05/19/2014  Breastfeeding? Unknown Physical Exam  Constitutional: She is oriented to person, place, and time. She appears well-developed and well-nourished. No distress.  Obese with moderate diaphoresis  HENT:  Head: Normocephalic and atraumatic.  Right Ear: Hearing, tympanic membrane, external ear and ear canal normal.  Left Ear:  Hearing, tympanic membrane, external ear and ear canal normal.  Nose: Nose normal.  Mouth/Throat: Uvula is midline and mucous membranes are normal. Posterior oropharyngeal erythema present. No oropharyngeal exudate, posterior oropharyngeal edema or tonsillar abscesses.  Eyes: Conjunctivae are normal. No scleral icterus.  Neck: Normal range of motion. Neck supple. No thyromegaly present.  Cardiovascular: Regular rhythm and normal heart sounds.   +mild tachycardia  Pulmonary/Chest: Effort normal and breath sounds normal. No stridor. No respiratory distress. She has no wheezes.  Musculoskeletal: Normal range of motion.  Lymphadenopathy:    She has no cervical adenopathy.  Neurological: She is alert and oriented to person, place, and time.  Skin: Skin is warm and dry.  Psychiatric: She has a normal mood and affect. Her behavior is normal.  Nursing note and vitals reviewed.   ED Course  Procedures (including critical care time) Labs Review Labs Reviewed  POCT RAPID STREP A ([facility] CARE ONLY) - Abnormal; Notable for the following:    Streptococcus, Group A Screen (Direct) POSITIVE (*)    All other components within normal limits  POCT URINALYSIS DIP (DEVICE) - Abnormal; Notable for the following:    Glucose, UA 500 (*)    Protein, ur 30 (*)    All other components within normal limits  POCT I-STAT, CHEM 8 - Abnormal; Notable for the following:    Glucose, Bld 209 (*)    Hemoglobin 16.0 (*)    HCT 47.0 (*)    All other components within normal limits  POCT PREGNANCY, URINE    Imaging Review No results found.   MDM   1. Strep pharyngitis   2. Hyperglycemia    Rapid strep positive. Patient opted for treatment with 1.2 MU of Bicillin LA at [facility]. IStat 8 notable only for hyperglycemia with glucose at 209. I suspect this is in response to current illness.  UA with moderate glucosuria but no ketonuria. UPT negative Will encourage increased oral fluid intake at home along with  tylenol or ibuprofen as directed on packaging for fever and myalgias. Monitor symptoms and blood glucose levels closely at home and if symptoms become suddenly worse or severe, advised to seek re-evaluation at local ER. If glucose levels remain high, advised to contact PCP.     Lutricia Feil, [place] 06/11/14 1048

## 2014-06-11 NOTE — Discharge Instructions (Signed)
You have been treated here for strep throat. Please make sure that you are drinking at least 8-10, 8 ounce glasses of water a day and monitor your blood glucose levels closely. Continue to use tylenol or ibuprofen as directed on packaging for fever, pain and body aches. If blood glucose levels remain elevated, please contact your doctor. If symptoms become suddenly worse or severe, please have yourself re-evaluated at your nearest emergency room.   Hyperglycemia Hyperglycemia occurs when the glucose (sugar) in your blood is too high. Hyperglycemia can happen for many reasons, but it most often happens to people who do not know they have diabetes or are not managing their diabetes properly.  CAUSES  Whether you have diabetes or not, there are other causes of hyperglycemia. Hyperglycemia can occur when you have diabetes, but it can also occur in other situations that you might not be as aware of, such as: Diabetes  If you have diabetes and are having problems controlling your blood glucose, hyperglycemia could occur because of some of the following reasons:  Not following your meal plan.  Not taking your diabetes medications or not taking it properly.  Exercising less or doing less activity than you normally do.  Being sick. Pre-diabetes  This cannot be ignored. Before people develop Type 2 diabetes, they almost always have "pre-diabetes." This is when your blood glucose levels are higher than normal, but not yet high enough to be diagnosed as diabetes. Research has shown that some long-term damage to the body, especially the heart and circulatory system, may already be occurring during pre-diabetes. If you take action to manage your blood glucose when you have pre-diabetes, you may delay or prevent Type 2 diabetes from developing. Stress  If you have diabetes, you may be "diet" controlled or on oral medications or insulin to control your diabetes. However, you may find that your blood glucose is  higher than usual in the hospital whether you have diabetes or not. This is often referred to as "stress hyperglycemia." Stress can elevate your blood glucose. This happens because of hormones put out by the body during times of stress. If stress has been the cause of your high blood glucose, it can be followed regularly by your caregiver. That way he/she can make sure your hyperglycemia does not continue to get worse or progress to diabetes. Steroids  Steroids are medications that act on the infection fighting system (immune system) to block inflammation or infection. One side effect can be a rise in blood glucose. Most people can produce enough extra insulin to allow for this rise, but for those who cannot, steroids make blood glucose levels go even higher. It is not unusual for steroid treatments to "uncover" diabetes that is developing. It is not always possible to determine if the hyperglycemia will go away after the steroids are stopped. A special blood test called an A1c is sometimes done to determine if your blood glucose was elevated before the steroids were started. SYMPTOMS  Thirsty.  Frequent urination.  Dry mouth.  Blurred vision.  Tired or fatigue.  Weakness.  Sleepy.  Tingling in feet or leg. DIAGNOSIS  Diagnosis is made by monitoring blood glucose in one or all of the following ways:  A1c test. This is a chemical found in your blood.  Fingerstick blood glucose monitoring.  Laboratory results. TREATMENT  First, knowing the cause of the hyperglycemia is important before the hyperglycemia can be treated. Treatment may include, but is not be limited to:  Education.  Change or adjustment in medications.  Change or adjustment in meal plan.  Treatment for an illness, infection, etc.  More frequent blood glucose monitoring.  Change in exercise plan.  Decreasing or stopping steroids.  Lifestyle changes. HOME CARE INSTRUCTIONS   Test your blood glucose as  directed.  Exercise regularly. Your caregiver will give you instructions about exercise. Pre-diabetes or diabetes which comes on with stress is helped by exercising.  Eat wholesome, balanced meals. Eat often and at regular, fixed times. Your caregiver or nutritionist will give you a meal plan to guide your sugar intake.  Being at an ideal weight is important. If needed, losing as little as 10 to 15 pounds may help improve blood glucose levels. SEEK MEDICAL CARE IF:   You have questions about medicine, activity, or diet.  You continue to have symptoms (problems such as increased thirst, urination, or weight gain). SEEK IMMEDIATE MEDICAL CARE IF:   You are vomiting or have diarrhea.  Your breath smells fruity.  You are breathing faster or slower.  You are very sleepy or incoherent.  You have numbness, tingling, or pain in your feet or hands.  You have chest pain.  Your symptoms get worse even though you have been following your caregiver's orders.  If you have any other questions or concerns. Document Released: 09/13/2000 Document Revised: 06/12/2011 Document Reviewed: 07/17/2011 [facility] Patient Information 2015 [place], [place]. This information is not intended to replace advice given to you by your health care provider. Make sure you discuss any questions you have with your health care provider.  Strep Throat Strep throat is an infection of the throat caused by a bacteria named Streptococcus pyogenes. Your health care provider may call the infection streptococcal "tonsillitis" or "pharyngitis" depending on whether there are signs of inflammation in the tonsils or back of the throat. Strep throat is most common in children aged 5-15 years during the cold months of the year, but it can occur in people of any age during any season. This infection is spread from person to person (contagious) through coughing, sneezing, or other close contact. SIGNS AND SYMPTOMS   Fever or  chills.  Painful, swollen, red tonsils or throat.  Pain or difficulty when swallowing.  White or yellow spots on the tonsils or throat.  Swollen, tender lymph nodes or "glands" of the neck or under the jaw.  Red rash all over the body (rare). DIAGNOSIS  Many different infections can cause the same symptoms. A test must be done to confirm the diagnosis so the right treatment can be given. A "rapid strep test" can help your health care provider make the diagnosis in a few minutes. If this test is not available, a light swab of the infected area can be used for a throat culture test. If a throat culture test is done, results are usually available in a day or two. TREATMENT  Strep throat is treated with antibiotic medicine. HOME CARE INSTRUCTIONS   Gargle with 1 tsp of salt in 1 cup of warm water, 3-4 times per day or as needed for comfort.  Family members who also have a sore throat or fever should be tested for strep throat and treated with antibiotics if they have the strep infection.  Make sure everyone in your household washes their hands well.  Do not share food, drinking cups, or personal items that could cause the infection to spread to others.  You may need to eat a soft food diet until your sore throat  gets better.  Drink enough water and fluids to keep your urine clear or pale yellow. This will help prevent dehydration.  Get plenty of rest.  Stay home from school, day care, or work until you have been on antibiotics for 24 hours.  Take medicines only as directed by your health care provider.  Take your antibiotic medicine as directed by your health care provider. Finish it even if you start to feel better. SEEK MEDICAL CARE IF:   The glands in your neck continue to enlarge.  You develop a rash, cough, or earache.  You cough up green, yellow-brown, or bloody sputum.  You have pain or discomfort not controlled by medicines.  Your problems seem to be getting worse  rather than better.  You have a fever. SEEK IMMEDIATE MEDICAL CARE IF:   You develop any new symptoms such as vomiting, severe headache, stiff or painful neck, chest pain, shortness of breath, or trouble swallowing.  You develop severe throat pain, drooling, or changes in your voice.  You develop swelling of the neck, or the skin on the neck becomes red and tender.  You develop signs of dehydration, such as fatigue, dry mouth, and decreased urination.  You become increasingly sleepy, or you cannot wake up completely. MAKE SURE YOU:  Understand these instructions.  Will watch your condition.  Will get help right away if you are not doing well or get worse. Document Released: 03/17/2000 Document Revised: 08/04/2013 Document Reviewed: 05/19/2010 [facility] Patient Information 2015 [place]. This information is not intended to replace advice given to you by your health care provider. Make sure you discuss any questions you have with your health care provider.

## 2014-06-11 NOTE — ED Notes (Signed)
C/o  Sore throat.  Pain with swallowing.  Dizziness.  Nonproductive cough.  Nasal congestion.  Fever.   Denies vomiting and diarrhea.  No relief with using cold/flu meds.  Symptoms present x 2 days.

## 2014-06-15 ENCOUNTER — Ambulatory Visit: Payer: Self-pay | Attending: Internal Medicine | Admitting: Internal Medicine

## 2014-06-15 ENCOUNTER — Encounter: Payer: Self-pay | Admitting: Internal Medicine

## 2014-06-15 VITALS — BP 130/87 | HR 99 | Temp 98.0°F | Resp 16 | Wt 289.0 lb

## 2014-06-15 DIAGNOSIS — E119 Type 2 diabetes mellitus without complications: Secondary | ICD-10-CM | POA: Insufficient documentation

## 2014-06-15 DIAGNOSIS — Z1231 Encounter for screening mammogram for malignant neoplasm of breast: Secondary | ICD-10-CM

## 2014-06-15 DIAGNOSIS — E139 Other specified diabetes mellitus without complications: Secondary | ICD-10-CM

## 2014-06-15 LAB — COMPLETE METABOLIC PANEL WITH GFR
ALK PHOS: 66 U/L (ref 39–117)
ALT: 45 U/L — ABNORMAL HIGH (ref 0–35)
AST: 43 U/L — ABNORMAL HIGH (ref 0–37)
Albumin: 4 g/dL (ref 3.5–5.2)
BUN: 9 mg/dL (ref 6–23)
CO2: 26 meq/L (ref 19–32)
Calcium: 9.3 mg/dL (ref 8.4–10.5)
Chloride: 99 mEq/L (ref 96–112)
Creat: 0.57 mg/dL (ref 0.50–1.10)
GLUCOSE: 173 mg/dL — AB (ref 70–99)
POTASSIUM: 4.4 meq/L (ref 3.5–5.3)
SODIUM: 138 meq/L (ref 135–145)
Total Bilirubin: 0.2 mg/dL (ref 0.2–1.2)
Total Protein: 7.1 g/dL (ref 6.0–8.3)

## 2014-06-15 LAB — GLUCOSE, POCT (MANUAL RESULT ENTRY): POC GLUCOSE: 208 mg/dL — AB (ref 70–99)

## 2014-06-15 LAB — POCT GLYCOSYLATED HEMOGLOBIN (HGB A1C): HEMOGLOBIN A1C: 7.5

## 2014-06-15 MED ORDER — METFORMIN HCL 1000 MG PO TABS: 1000.0000 mg | ORAL_TABLET | Freq: Two times a day (BID) | ORAL | Status: DC

## 2014-06-15 MED ORDER — GLIPIZIDE 5 MG PO TABS: 5.0000 mg | ORAL_TABLET | Freq: Two times a day (BID) | ORAL | Status: DC

## 2014-06-15 MED ORDER — GLUCOSE BLOOD VI STRP: ORAL_STRIP | Status: DC

## 2014-06-15 NOTE — Patient Instructions (Signed)
Diabetes Mellitus and Food It is important for you to manage your blood sugar (glucose) level. Your blood glucose level can be greatly affected by what you eat. Eating healthier foods in the appropriate amounts throughout the day at about the same time each day will help you control your blood glucose level. It can also help slow or prevent worsening of your diabetes mellitus. Healthy eating may even help you improve the level of your blood pressure and reach or maintain a healthy weight.  HOW CAN FOOD AFFECT ME? Carbohydrates Carbohydrates affect your blood glucose level more than any other type of food. Your dietitian will help you determine how many carbohydrates to eat at each meal and teach you how to count carbohydrates. Counting carbohydrates is important to keep your blood glucose at a healthy level, especially if you are using insulin or taking certain medicines for diabetes mellitus. Alcohol Alcohol can cause sudden decreases in blood glucose (hypoglycemia), especially if you use insulin or take certain medicines for diabetes mellitus. Hypoglycemia can be a life-threatening condition. Symptoms of hypoglycemia (sleepiness, dizziness, and disorientation) are similar to symptoms of having too much alcohol.  If your health care provider has given you approval to drink alcohol, do so in moderation and use the following guidelines:  Women should not have more than one drink per day, and men should not have more than two drinks per day. One drink is equal to:  12 oz of beer.  5 oz of wine.  1 oz of hard liquor.  Do not drink on an empty stomach.  Keep yourself hydrated. Have water, diet soda, or unsweetened iced tea.  Regular soda, juice, and other mixers might contain a lot of carbohydrates and should be counted. WHAT FOODS ARE NOT RECOMMENDED? As you make food choices, it is important to remember that all foods are not the same. Some foods have fewer nutrients per serving than other  foods, even though they might have the same number of calories or carbohydrates. It is difficult to get your body what it needs when you eat foods with fewer nutrients. Examples of foods that you should avoid that are high in calories and carbohydrates but low in nutrients include:  Trans fats (most processed foods list trans fats on the Nutrition Facts label).  Regular soda.  Juice.  Candy.  Sweets, such as cake, pie, doughnuts, and cookies.  Fried foods. WHAT FOODS CAN I EAT? Have nutrient-rich foods, which will nourish your body and keep you healthy. The food you should eat also will depend on several factors, including:  The calories you need.  The medicines you take.  Your weight.  Your blood glucose level.  Your blood pressure level.  Your cholesterol level. You also should eat a variety of foods, including:  Protein, such as meat, poultry, fish, tofu, nuts, and seeds (lean animal proteins are best).  Fruits.  Vegetables.  Dairy products, such as milk, cheese, and yogurt (low fat is best).  Breads, grains, pasta, cereal, rice, and beans.  Fats such as olive oil, trans fat-free margarine, canola oil, avocado, and olives. DOES EVERYONE WITH DIABETES MELLITUS HAVE THE SAME MEAL PLAN? Because every person with diabetes mellitus is different, there is not one meal plan that works for everyone. It is very important that you meet with a dietitian who will help you create a meal plan that is just right for you. Document Released: 12/15/2004 Document Revised: 03/25/2013 Document Reviewed: 02/14/2013 [employer] Patient Information 2015 [employer]. This   information is not intended to replace advice given to you by your health care provider. Make sure you discuss any questions you have with your health care provider.  

## 2014-06-15 NOTE — Progress Notes (Signed)
MRN: [id number] Name: Norma Chambers  Sex: female Age: 44 y.o. DOB: [birth year]  Allergies: Review of patient's allergies indicates no known allergies.  Chief Complaint  Patient presents with  . Follow-up    HPI: Patient is 44 y.o. female who history of diabetes comes today for followup as per patient she ran out of for medication Glucotrol for more than a week now, she also went to the emergency room and was diagnosed with strep throat and was given injection for the she denies any more fever chills coughing or sore throat, today her hemoglobin A1c noticed to be 7.5%, as per patient her fasting blood sugar is around 1 40 mg/dL.  Past Medical History  Diagnosis Date  . Gestational diabetes     Past Surgical History  Procedure Laterality Date  . Cesarean section    . Cesarean section N/A 11/18/2012    Procedure: REPEAT CESAREAN SECTION;  Surgeon: Mora Bellman, MD;  Location: [facility];  Service: Obstetrics;  Laterality: N/A;      Medication List       This list is accurate as of: 06/15/14 12:08 PM.  Always use your most recent med list.               amoxicillin-clavulanate 875-125 MG per tablet  Commonly known as:  AUGMENTIN  Take 1 tablet by mouth every 12 (twelve) hours.     glipiZIDE 5 MG tablet  Commonly known as:  GLUCOTROL  Take 1 tablet (5 mg total) by mouth 2 (two) times daily before a meal.     glucose blood test strip  Use as instructed- test blood sugar 4 times daily     glucose blood test strip  Commonly known as:  CHOICE DM FORA G20 TEST STRIPS  Use as instructed     glucose monitoring kit monitoring kit  1 each by Does not apply route 4 (four) times daily - after meals and at bedtime. 1 month Diabetic Testing Supplies for QAC-QHS accuchecks.     ibuprofen 600 MG tablet  Commonly known as:  ADVIL,MOTRIN  Take 1 tablet (600 mg total) by mouth every 6 (six) hours.     metFORMIN 1000 MG tablet  Commonly known as:  GLUCOPHAGE  Take 1 tablet (1,000  mg total) by mouth 2 (two) times daily with a meal.     multivitamin-prenatal 27-0.8 MG Tabs tablet  Take 1 tablet by mouth daily at 12 noon.     oxyCODONE-acetaminophen 5-325 MG per tablet  Commonly known as:  PERCOCET/ROXICET  Take 1-2 tablets by mouth every 4 (four) hours as needed.        Meds ordered this encounter  Medications  . glucose blood (CHOICE DM FORA G20 TEST STRIPS) test strip    Sig: Use as instructed    Dispense:  100 each    Refill:  12  . glipiZIDE (GLUCOTROL) 5 MG tablet    Sig: Take 1 tablet (5 mg total) by mouth 2 (two) times daily before a meal.    Dispense:  60 tablet    Refill:  3  . metFORMIN (GLUCOPHAGE) 1000 MG tablet    Sig: Take 1 tablet (1,000 mg total) by mouth 2 (two) times daily with a meal.    Dispense:  60 tablet    Refill:  3    Immunization History  Administered Date(s) Administered  . Tdap 09/30/2012    Family History  Problem Relation Age of Onset  . Diabetes Mother   .  Diabetes Maternal Grandmother     History  Substance Use Topics  . Smoking status: Never Smoker   . Smokeless tobacco: Never Used  . Alcohol Use: No    Review of Systems   As noted in HPI  Filed Vitals:   06/15/14 1143  BP: 130/87  Pulse: 99  Temp: 98 F (36.7 C)  Resp: 16    Physical Exam  Physical Exam  Eyes: EOM are normal. Pupils are equal, round, and reactive to light.  Neck: Neck supple.  Cardiovascular: Normal rate and regular rhythm.   Pulmonary/Chest: Breath sounds normal. No respiratory distress. She has no wheezes. She has no rales.  Musculoskeletal: She exhibits no edema.    CBC    Component Value Date/Time   WBC 11.9* 11/19/2012 0546   RBC 3.63* 11/19/2012 0546   HGB 16.0* 06/11/2014 1030   HGB 11.9 08/05/2012   HCT 47.0* 06/11/2014 1030   HCT 36 08/05/2012   PLT 222 11/19/2012 0546   PLT 262 08/05/2012   MCV 86.0 11/19/2012 0546    CMP     Component Value Date/Time   NA 136 06/11/2014 1030   K 4.0 06/11/2014  1030   CL 98 06/11/2014 1030   CO2 22 11/14/2012 1114   GLUCOSE 209* 06/11/2014 1030   BUN 7 06/11/2014 1030   CREATININE 0.60 06/11/2014 1030   CALCIUM 9.2 11/14/2012 1114   GFRNONAA >90 11/14/2012 1114   GFRAA >90 11/14/2012 1114    No results found for: CHOL  No components found for: HGA1C  No results found for: AST  Assessment and Plan  Other specified diabetes mellitus without complications - Plan:  Results for orders placed or performed in visit on 06/15/14  Glucose (CBG)  Result Value Ref Range   POC Glucose 208 (A) 70 - 99 mg/dl  HgB A1c  Result Value Ref Range   Hemoglobin A1C 7.50     HgB A1c is 7.5% not improved compared to last visit, since patient ran out of her medications, resume back on meds, advised patient for diabetes meal planning, repeat A1c in 3 months., COMPLETE METABOLIC PANEL WITH GFR, glucose blood (CHOICE DM FORA G20 TEST STRIPS) test strip, glipiZIDE (GLUCOTROL) 5 MG tablet, metFORMIN (GLUCOPHAGE) 1000 MG tablet  Encounter for screening mammogram for breast cancer - Plan: MM [place] Maintenance  -Mammogram:ordered   Return in about 3 months (around 09/15/2014) for diabetes.   This note has been created with Surveyor, quantity. Any transcriptional errors are unintentional.    Lorayne Marek, MD

## 2014-06-15 NOTE — Progress Notes (Signed)
Patient here for follow up on her diabetes States her blood sugars have been up and down Last week when she was sick her sugars were high

## 2014-06-18 ENCOUNTER — Telehealth: Payer: Self-pay

## 2014-06-18 NOTE — Telephone Encounter (Signed)
-----   Message from Doris Cheadleeepak Advani, MD sent at 06/16/2014  1:32 PM EDT ----- Blood work reviewed noticed borderline elevated liver enzymes, call and advise patient to avoid alcohol and Tylenol, will repeat blood chemistry on the following visit.

## 2014-06-18 NOTE — Telephone Encounter (Signed)
Patient not available Left message on voice mail to return our call 

## 2014-07-27 ENCOUNTER — Ambulatory Visit: Payer: Self-pay | Attending: Internal Medicine

## 2014-07-27 ENCOUNTER — Telehealth: Payer: Self-pay | Admitting: Internal Medicine

## 2014-10-29 ENCOUNTER — Other Ambulatory Visit: Payer: Self-pay | Admitting: Internal Medicine

## 2014-11-03 ENCOUNTER — Other Ambulatory Visit: Payer: Self-pay

## 2014-11-03 DIAGNOSIS — E139 Other specified diabetes mellitus without complications: Secondary | ICD-10-CM

## 2014-11-03 MED ORDER — GLIPIZIDE 5 MG PO TABS: 5.0000 mg | ORAL_TABLET | Freq: Two times a day (BID) | ORAL | Status: DC

## 2014-11-17 ENCOUNTER — Ambulatory Visit: Payer: Self-pay | Admitting: Family Medicine

## 2014-11-26 ENCOUNTER — Inpatient Hospital Stay (HOSPITAL_COMMUNITY)
Admission: AD | Admit: 2014-11-26 | Discharge: 2014-11-26 | Disposition: A | Discharge status: Home / Self Care | Payer: Medicaid Other | Source: Ambulatory Visit | Attending: Family Medicine | Admitting: Family Medicine

## 2014-11-26 ENCOUNTER — Encounter (HOSPITAL_COMMUNITY): Payer: Self-pay | Admitting: *Deleted

## 2014-11-26 DIAGNOSIS — K047 Periapical abscess without sinus: Secondary | ICD-10-CM | POA: Diagnosis not present

## 2014-11-26 DIAGNOSIS — Z3A11 11 weeks gestation of pregnancy: Secondary | ICD-10-CM | POA: Insufficient documentation

## 2014-11-26 DIAGNOSIS — K088 Other specified disorders of teeth and supporting structures: Secondary | ICD-10-CM | POA: Diagnosis present

## 2014-11-26 DIAGNOSIS — O9989 Other specified diseases and conditions complicating pregnancy, childbirth and the puerperium: Secondary | ICD-10-CM | POA: Insufficient documentation

## 2014-11-26 LAB — POCT PREGNANCY, URINE: PREG TEST UR: POSITIVE — AB

## 2014-11-26 MED ORDER — AMOXICILLIN 500 MG PO CAPS: 500.0000 mg | ORAL_CAPSULE | Freq: Three times a day (TID) | ORAL | Status: DC

## 2014-11-26 MED ORDER — ACETAMINOPHEN-CODEINE #3 300-30 MG PO TABS: 1.0000 | ORAL_TABLET | Freq: Four times a day (QID) | ORAL | Status: DC | PRN

## 2014-11-26 NOTE — Discharge Instructions (Signed)
Dental Abscess A dental abscess is a collection of infected fluid (pus) from a bacterial infection in the inner part of the tooth (pulp). It usually occurs at the end of the tooth's root.  CAUSES   Severe tooth decay.  Trauma to the tooth that allows bacteria to enter into the pulp, such as a broken or chipped tooth. SYMPTOMS   Severe pain in and around the infected tooth.  Swelling and redness around the abscessed tooth or in the mouth or face.  Tenderness.  Pus drainage.  Bad breath.  Bitter taste in the mouth.  Difficulty swallowing.  Difficulty opening the mouth.  Nausea.  Vomiting.  Chills.  Swollen neck glands. DIAGNOSIS   A medical and dental history will be taken.  An examination will be performed by tapping on the abscessed tooth.  X-rays may be taken of the tooth to identify the abscess. TREATMENT The goal of treatment is to eliminate the infection. You may be prescribed antibiotic medicine to stop the infection from spreading. A root canal may be performed to save the tooth. If the tooth cannot be saved, it may be pulled (extracted) and the abscess may be drained.  HOME CARE INSTRUCTIONS  Only take over-the-counter or prescription medicines for pain, fever, or discomfort as directed by your caregiver.  Rinse your mouth (gargle) often with salt water ( tsp salt in 8 oz [250 ml] of warm water) to relieve pain or swelling.  Do not drive after taking pain medicine (narcotics).  Do not apply heat to the outside of your face.  Return to your dentist for further treatment as directed. SEEK MEDICAL CARE IF:  Your pain is not helped by medicine.  Your pain is getting worse instead of better. SEEK IMMEDIATE MEDICAL CARE IF:  You have a fever or persistent symptoms for more than 2-3 days.  You have a fever and your symptoms suddenly get worse.  You have chills or a very bad headache.  You have problems breathing or swallowing.  You have trouble  opening your mouth.  You have swelling in the neck or around the eye. Document Released: 03/20/2005 Document Revised: 12/13/2011 Document Reviewed: 06/28/2010 [facility] Patient Information 2015 [place], [place]. This information is not intended to replace advice given to you by your health care provider. Make sure you discuss any questions you have with your health care pr Dental Assistance:  If unable to pay or uninsured, contact: [facility]. to become qualified for the adult dental clinic. Patient must be enrolled in [facility] (uninsured, 0-200% FPL, qualifying info).  Enroll in [facility] first, then see Primary Care Physician assigned to you, the PCP makes a dental referral. [facility] will receive referral and contacts patient for appointment.  Patients with Medicaid           [address], [phone]  [facility] (Children up to 20 + Pregnant Women) - [phone]  [facility] - [address] - [phone]  If unable to pay, or uninsured: contact [facility] Department [phone] in [place] - (Children only + Pregnant Women), [phone] in [facility]- Children only) to become qualified for the adult dental clinic  Must see if eligible to enroll in [facility] before enrolling into the [facility] (exemption required) [phone] for an appointment)  [email or web address];   [phone].  If not eligible for ACA, then go by Department of Health and Human Services to see if eligible  for orange card.  [address] [place] and [phone]  Once you get an orange card, you will have a Primary Care home who will then refer you to dental if needed.  Other IT consultant:   [facility] [phone] (ext [phone])   [address]  Dr. Lawrence Marseilles - [phone]   [address]    [place] - [phone]   [facility]           [address] [place], [place], [place], [address]           [phone], Ext. [phone]           2nd and 4th Thursday of the month at 6:30am (Simple extractions only - no wisdom teeth or surgery) First come/First serve -First 10 clients served           [facility] [place], [place] and [place] residents only)          [address] [place], [place], [address]           [phone]                    [facility] Health Department           [phone]          [facility] Health Department          [phone]         [facility] Health Department - [facility]          [phone]

## 2014-11-26 NOTE — MAU Note (Signed)
Pt reports pain in her upper teeth on the left side and swelling since last pm, states her ear on that side has been hurting today also. Denies abd pain or bleeding.

## 2014-11-26 NOTE — MAU Provider Note (Signed)
History     CSN: [id number]  Arrival date and time: 11/26/14 1846   None     No chief complaint on file.  HPI Pt is [redacted] weeks pregnant G4P3 c/o of left upper tooth pain radiating to ear since last night Pt denies any abd pain, bleeding or cramping, nausea or vomiting Pt goes to [facility] and has appt on Sept 6 then will be referred to [facility] clinic due to diabetes Pt has FBS 109 and 110 in good control Pt has not taken anything for pain  Pt has hx of ear infection and has antibioitc drops  from 3 months ago. Pt tried to see dentist but was told since she was pregnant that they would not be able to prescribe any medications Past Medical History  Diagnosis Date  . Gestational diabetes     Past Surgical History  Procedure Laterality Date  . Cesarean section    . Cesarean section N/A 11/18/2012    Procedure: REPEAT CESAREAN SECTION;  Surgeon: Mora Bellman, MD;  Location: [place] [facility];  Service: Obstetrics;  Laterality: N/A;    Family History  Problem Relation Age of Onset  . Diabetes Mother   . Diabetes Maternal Grandmother     Social History  Substance Use Topics  . Smoking status: Never Smoker   . Smokeless tobacco: Never Used  . Alcohol Use: No    Allergies: No Known Allergies  Prescriptions prior to admission  Medication Sig Dispense Refill Last Dose  . amoxicillin-clavulanate (AUGMENTIN) 875-125 MG per tablet Take 1 tablet by mouth every 12 (twelve) hours. 14 tablet 0 Unknown at Unknown time  . glipiZIDE (GLUCOTROL) 5 MG tablet Take 1 tablet (5 mg total) by mouth 2 (two) times daily before a meal. 60 tablet 0   . glucose blood (CHOICE DM FORA G20 TEST STRIPS) test strip Use as instructed 100 each 12   . glucose blood test strip Use as instructed- test blood sugar 4 times daily 50 each 12 Not Taking  . glucose monitoring kit (FREESTYLE) monitoring kit 1 each by Does not apply route 4 (four) times daily - after meals and at bedtime. 1 month Diabetic Testing Supplies for  QAC-QHS accuchecks. 1 each 1   . ibuprofen (ADVIL,MOTRIN) 600 MG tablet Take 1 tablet (600 mg total) by mouth every 6 (six) hours. 30 tablet 0 Not Taking  . metFORMIN (GLUCOPHAGE) 1000 MG tablet Take 1 tablet (1,000 mg total) by mouth 2 (two) times daily with a meal. 60 tablet 3   . oxyCODONE-acetaminophen (PERCOCET/ROXICET) 5-325 MG per tablet Take 1-2 tablets by mouth every 4 (four) hours as needed. 30 tablet 0 Not Taking  . Prenatal Vit-Fe Fumarate-FA (MULTIVITAMIN-PRENATAL) 27-0.8 MG TABS Take 1 tablet by mouth daily at 12 noon.   Not Taking    Review of Systems  Constitutional: Negative for fever and chills.  HENT: Positive for ear pain.   Gastrointestinal: Negative for nausea, vomiting and abdominal pain.   Physical Exam   Last menstrual period 09/07/2014, unknown if currently breastfeeding.  Physical Exam  Nursing note and vitals reviewed. Constitutional: She is oriented to person, place, and time. She appears well-developed and well-nourished. No distress.  HENT:  Head: Normocephalic.  Left upper gum mildly edematous with tenderness  Eyes: Pupils are equal, round, and reactive to light.  Neck: Normal range of motion. Neck supple.  Respiratory: Effort normal.  GI: Soft.  Musculoskeletal: Normal range of motion.  Neurological: She is alert and oriented to person, place,  and time.  Skin: Skin is warm and dry.  Psychiatric: She has a normal mood and affect.    MAU Course  Procedures    Assessment and Plan  Dental infection in pregnancy Amoxicillin 561m TID for 7 days Tylenol #3 #12 for pain Letter for dental care [facility] for any concerns  Thanya Cegielski 11/26/2014, 7:19 PM

## 2014-12-06 ENCOUNTER — Inpatient Hospital Stay (HOSPITAL_COMMUNITY)
Admission: AD | Admit: 2014-12-06 | Discharge: 2014-12-07 | Disposition: A | Discharge status: Home / Self Care | Payer: Medicaid Other | Source: Ambulatory Visit | Attending: Obstetrics and Gynecology | Admitting: Obstetrics and Gynecology

## 2014-12-06 ENCOUNTER — Inpatient Hospital Stay (HOSPITAL_COMMUNITY): Payer: Medicaid Other

## 2014-12-06 DIAGNOSIS — O021 Missed abortion: Secondary | ICD-10-CM | POA: Insufficient documentation

## 2014-12-06 DIAGNOSIS — O209 Hemorrhage in early pregnancy, unspecified: Secondary | ICD-10-CM | POA: Diagnosis not present

## 2014-12-06 DIAGNOSIS — O039 Complete or unspecified spontaneous abortion without complication: Secondary | ICD-10-CM | POA: Diagnosis not present

## 2014-12-06 DIAGNOSIS — Z3A12 12 weeks gestation of pregnancy: Secondary | ICD-10-CM | POA: Insufficient documentation

## 2014-12-06 LAB — URINALYSIS, ROUTINE W REFLEX MICROSCOPIC
Bilirubin Urine: NEGATIVE
GLUCOSE, UA: NEGATIVE mg/dL
Ketones, ur: 15 mg/dL — AB
LEUKOCYTES UA: NEGATIVE
Nitrite: NEGATIVE
PH: 6 (ref 5.0–8.0)
Protein, ur: NEGATIVE mg/dL
SPECIFIC GRAVITY, URINE: 1.02 (ref 1.005–1.030)
Urobilinogen, UA: 0.2 mg/dL (ref 0.0–1.0)

## 2014-12-06 LAB — CBC
HEMATOCRIT: 38.4 % (ref 36.0–46.0)
HEMOGLOBIN: 12.4 g/dL (ref 12.0–15.0)
MCH: 28.5 pg (ref 26.0–34.0)
MCHC: 32.3 g/dL (ref 30.0–36.0)
MCV: 88.3 fL (ref 78.0–100.0)
Platelets: 294 10*3/uL (ref 150–400)
RBC: 4.35 MIL/uL (ref 3.87–5.11)
RDW: 14.1 % (ref 11.5–15.5)
WBC: 11.7 10*3/uL — ABNORMAL HIGH (ref 4.0–10.5)

## 2014-12-06 LAB — URINE MICROSCOPIC-ADD ON

## 2014-12-06 LAB — WET PREP, GENITAL
Clue Cells Wet Prep HPF POC: NONE SEEN
TRICH WET PREP: NONE SEEN
Yeast Wet Prep HPF POC: NONE SEEN

## 2014-12-06 NOTE — MAU Note (Signed)
Pt reports she started bleeding about 30 minutes ago.

## 2014-12-06 NOTE — MAU Provider Note (Signed)
None     Chief Complaint:  Vaginal Bleeding   Norma Chambers is  44 y.o. G5P3003 at 19w6dpresents complaining of Vaginal Bleeding that started 30 minutes ago. States that it was "a lot" of blood. No pain, minimal cramping.  Blood Type A+  Obstetrical/Gynecological History: OB History    Gravida Para Term Preterm AB TAB SAB Ectopic Multiple Living   5 3 3       3      Past Medical History: Past Medical History  Diagnosis Date  . Gestational diabetes     Past Surgical History: Past Surgical History  Procedure Laterality Date  . Cesarean section    . Cesarean section N/A 11/18/2012    Procedure: REPEAT CESAREAN SECTION;  Surgeon: PMora Bellman MD;  [facility];  Service: Obstetrics;  Laterality: N/A;    Family History: Family History  Problem Relation Age of Onset  . Diabetes Mother   . Diabetes Maternal Grandmother     Social History: Social History  Substance Use Topics  . Smoking status: Never Smoker   . Smokeless tobacco: Never Used  . Alcohol Use: No    Allergies: No Known Allergies  Meds:  Prescriptions prior to admission  Medication Sig Dispense Refill Last Dose  . acetaminophen-codeine (TYLENOL #3) 300-30 MG per tablet Take 1-2 tablets by mouth every 6 (six) hours as needed for moderate pain. 15 tablet 0   . amoxicillin (AMOXIL) 500 MG capsule Take 1 capsule (500 mg total) by mouth 3 (three) times daily. 21 capsule 0   . glipiZIDE (GLUCOTROL) 5 MG tablet Take 1 tablet (5 mg total) by mouth 2 (two) times daily before a meal. 60 tablet 0   . glucose blood (CHOICE DM FORA G20 TEST STRIPS) test strip Use as instructed 100 each 12   . glucose blood test strip Use as instructed- test blood sugar 4 times daily 50 each 12 Not Taking  . glucose monitoring kit (FREESTYLE) monitoring kit 1 each by Does not apply route 4 (four) times daily - after meals and at bedtime. 1 month Diabetic Testing Supplies for QAC-QHS accuchecks. 1 each 1   . metFORMIN (GLUCOPHAGE)  1000 MG tablet Take 1 tablet (1,000 mg total) by mouth 2 (two) times daily with a meal. 60 tablet 3   . oxyCODONE-acetaminophen (PERCOCET/ROXICET) 5-325 MG per tablet Take 1-2 tablets by mouth every 4 (four) hours as needed. 30 tablet 0 Not Taking  . Prenatal Vit-Fe Fumarate-FA (MULTIVITAMIN-PRENATAL) 27-0.8 MG TABS Take 1 tablet by mouth daily at 12 noon.   Not Taking    Review of Systems   Constitutional: Negative for fever and chills Eyes: Negative for visual disturbances Respiratory: Negative for shortness of breath, dyspnea Cardiovascular: Negative for chest pain or palpitations  Gastrointestinal: Negative for vomiting, diarrhea and constipation Genitourinary: Negative for dysuria and urgency Musculoskeletal: Negative for back pain, joint pain, myalgias.  Normal ROM  Neurological: Negative for dizziness and headaches    Physical Exam  Blood pressure 130/69, pulse 90, temperature 98.8 F (37.1 C), temperature source Oral, resp. rate 20, last menstrual period 09/07/2014, SpO2 98 %, unknown if currently breastfeeding. GENERAL: Well-developed, well-nourished female in no acute distress.  LUNGS: Clear to auscultation bilaterally.  HEART: Regular rate and rhythm. ABDOMEN: Soft, nontender, nondistended, gravid.  EXTREMITIES: Nontender, no edema, 2+ distal pulses. DTR's 2+ PELVIC:  SSE:  Small amount of bright red bleeding.  Cx non friable.  CX firm, closed  Labs: No results found for this or  any previous visit (from the past 24 hour(s)). Imaging Studies:  US shows IUP with 7.1 week fetus WITHOUT CARDIAC ACTIVITY Assessment: Norma Chambers is  44 y.o. G5P3003 at 68w6dpresents with missed/early SAB.  Plan: Has appt with [facility] on Tuesday.  Discussed with pt and husband.  Will not take any action at this time.  Consider cytotec if bleeding has not increased by Tuesday.  Bleeding precautions.   CRESENZO-DISHMAN,Miko Markwood 9/4/201611:20 PM

## 2014-12-07 ENCOUNTER — Encounter (HOSPITAL_COMMUNITY): Payer: Self-pay | Admitting: *Deleted

## 2014-12-07 NOTE — Discharge Instructions (Signed)
Miscarriage A miscarriage is the sudden loss of an unborn baby (fetus) before the 20th week of pregnancy. Most miscarriages happen in the first 3 months of pregnancy. Sometimes, it happens before a woman even knows she is pregnant. A miscarriage is also called a "spontaneous miscarriage" or "early pregnancy loss." Having a miscarriage can be an emotional experience. Talk with your caregiver about any questions you may have about miscarrying, the grieving process, and your future pregnancy plans. CAUSES   Problems with the fetal chromosomes that make it impossible for the baby to develop normally. Problems with the baby's genes or chromosomes are most often the result of errors that occur, by chance, as the embryo divides and grows. The problems are not inherited from the parents.  Infection of the cervix or uterus.   Hormone problems.   Problems with the cervix, such as having an incompetent cervix. This is when the tissue in the cervix is not strong enough to hold the pregnancy.   Problems with the uterus, such as an abnormally shaped uterus, uterine fibroids, or congenital abnormalities.   Certain medical conditions.   Smoking, drinking alcohol, or taking illegal drugs.   Trauma.  Often, the cause of a miscarriage is unknown.  SYMPTOMS   Vaginal bleeding or spotting, with or without cramps or pain.  Pain or cramping in the abdomen or lower back.  Passing fluid, tissue, or blood clots from the vagina. DIAGNOSIS  Your caregiver will perform a physical exam. You may also have an ultrasound to confirm the miscarriage. Blood or urine tests may also be ordered. TREATMENT   Sometimes, treatment is not necessary if you naturally pass all the fetal tissue that was in the uterus. If some of the fetus or placenta remains in the body (incomplete miscarriage), tissue left behind may become infected and must be removed. Usually, a dilation and curettage (D and C) procedure is performed.  During a D and C procedure, the cervix is widened (dilated) and any remaining fetal or placental tissue is gently removed from the uterus.  Antibiotic medicines are prescribed if there is an infection. Other medicines may be given to reduce the size of the uterus (contract) if there is a lot of bleeding.  If you have Rh negative blood and your baby was Rh positive, you will need a Rh immunoglobulin shot. This shot will protect any future baby from having Rh blood problems in future pregnancies. HOME CARE INSTRUCTIONS   Your caregiver may order bed rest or may allow you to continue light activity. Resume activity as directed by your caregiver.  Have someone help with home and family responsibilities during this time.   Keep track of the number of sanitary pads you use each day and how soaked (saturated) they are. Write down this information.   Do not use tampons. Do not douche or have sexual intercourse until approved by your caregiver.   Only take over-the-counter or prescription medicines for pain or discomfort as directed by your caregiver.   Do not take aspirin. Aspirin can cause bleeding.   Keep all follow-up appointments with your caregiver.   If you or your partner have problems with grieving, talk to your caregiver or seek counseling to help cope with the pregnancy loss. Allow enough time to grieve before trying to get pregnant again.  SEEK IMMEDIATE MEDICAL CARE IF:   You have severe cramps or pain in your back or abdomen.  You have a fever.  You pass large blood clots (walnut-sized   or larger) ortissue from your vagina. Save any tissue for your caregiver to inspect.   Your bleeding increases.   You have a thick, bad-smelling vaginal discharge.  You become lightheaded, weak, or you faint.   You have chills.  MAKE SURE YOU:  Understand these instructions.  Will watch your condition.  Will get help right away if you are not doing well or get  worse. Document Released: 09/13/2000 Document Revised: 07/15/2012 Document Reviewed: 05/09/2011 Exit[employer] Patient Information 2015 [employer]. This information is not intended to replace advice given to you by your health care provider. Make sure you discuss any questions you have with your health care provider.  

## 2014-12-08 ENCOUNTER — Inpatient Hospital Stay (HOSPITAL_COMMUNITY)
Admission: AD | Admit: 2014-12-08 | Discharge: 2014-12-08 | Discharge status: Against Medical Advice | Payer: Medicaid Other | Source: Ambulatory Visit | Attending: Obstetrics & Gynecology | Admitting: Obstetrics & Gynecology

## 2014-12-08 DIAGNOSIS — Z5320 Procedure and treatment not carried out because of patient's decision for unspecified reasons: Secondary | ICD-10-CM | POA: Diagnosis not present

## 2014-12-08 DIAGNOSIS — O209 Hemorrhage in early pregnancy, unspecified: Secondary | ICD-10-CM | POA: Diagnosis present

## 2014-12-08 LAB — URINALYSIS, ROUTINE W REFLEX MICROSCOPIC
BILIRUBIN URINE: NEGATIVE
Glucose, UA: 500 mg/dL — AB
Ketones, ur: 15 mg/dL — AB
NITRITE: NEGATIVE
PH: 5.5 (ref 5.0–8.0)
Protein, ur: 30 mg/dL — AB
Urobilinogen, UA: 0.2 mg/dL (ref 0.0–1.0)

## 2014-12-08 LAB — CBC
HCT: 37.4 % (ref 36.0–46.0)
HEMOGLOBIN: 11.9 g/dL — AB (ref 12.0–15.0)
MCH: 28.1 pg (ref 26.0–34.0)
MCHC: 31.8 g/dL (ref 30.0–36.0)
MCV: 88.4 fL (ref 78.0–100.0)
PLATELETS: 291 10*3/uL (ref 150–400)
RBC: 4.23 MIL/uL (ref 3.87–5.11)
RDW: 14.2 % (ref 11.5–15.5)
WBC: 12.1 10*3/uL — ABNORMAL HIGH (ref 4.0–10.5)

## 2014-12-08 LAB — URINE MICROSCOPIC-ADD ON

## 2014-12-08 LAB — GC/CHLAMYDIA PROBE AMP (~~LOC~~) NOT AT ARMC
CHLAMYDIA, DNA PROBE: NEGATIVE
Neisseria Gonorrhea: NEGATIVE

## 2014-12-08 NOTE — MAU Note (Signed)
Went out to call pt to Rm #10. Husband and child came to nurse and stated pt was in car because she had waited 3 hours and was tired. Told husband that I have a room for pt and will be glad to bring her back to room. Husband states the 2 children are hungry. Told husband pt can come back to her room and we will see her while he takes children to eat. States they are just going to leave and return later. Explained he will have to sign in and may have to wait again but still plans to leave and return later.

## 2014-12-08 NOTE — MAU Note (Signed)
Pt C/O heavy bleeding & passing clots since yesterday, was seen previously in MAU - no cardiac activity.  Pt had a lot of cramping yesterday, none today.  Pt has appt with [facility] today - went there, then was told to come to MAU.

## 2014-12-10 ENCOUNTER — Telehealth: Payer: Self-pay | Admitting: Internal Medicine

## 2014-12-10 NOTE — Telephone Encounter (Signed)
Patient called requesting medication refill for glipiZIDE (GLUCOTROL) 5 MG tablet , glucose blood test strip. Please f/u with patient

## 2014-12-17 ENCOUNTER — Other Ambulatory Visit: Payer: Self-pay

## 2014-12-17 DIAGNOSIS — E139 Other specified diabetes mellitus without complications: Secondary | ICD-10-CM

## 2014-12-17 MED ORDER — GLIPIZIDE 5 MG PO TABS: 5.0000 mg | ORAL_TABLET | Freq: Two times a day (BID) | ORAL | Status: DC

## 2014-12-17 NOTE — Telephone Encounter (Signed)
Patient's husband came in requesting a medication refill for, glipiZIDE (GLUCOTROL), glucose blood test strip  Please follow up.

## 2014-12-25 ENCOUNTER — Ambulatory Visit: Payer: Medicaid Other | Attending: Internal Medicine | Admitting: Family Medicine

## 2014-12-25 ENCOUNTER — Encounter: Payer: Self-pay | Admitting: Family Medicine

## 2014-12-25 VITALS — BP 108/70 | HR 99 | Temp 99.0°F | Resp 18 | Ht 64.0 in | Wt 296.0 lb

## 2014-12-25 DIAGNOSIS — E139 Other specified diabetes mellitus without complications: Secondary | ICD-10-CM | POA: Diagnosis not present

## 2014-12-25 DIAGNOSIS — I10 Essential (primary) hypertension: Secondary | ICD-10-CM | POA: Diagnosis not present

## 2014-12-25 DIAGNOSIS — E119 Type 2 diabetes mellitus without complications: Secondary | ICD-10-CM | POA: Diagnosis not present

## 2014-12-25 LAB — POCT GLYCOSYLATED HEMOGLOBIN (HGB A1C): HEMOGLOBIN A1C: 6.8

## 2014-12-25 LAB — GLUCOSE, POCT (MANUAL RESULT ENTRY): POC GLUCOSE: 204 mg/dL — AB (ref 70–99)

## 2014-12-25 MED ORDER — METFORMIN HCL 1000 MG PO TABS: 1000.0000 mg | ORAL_TABLET | Freq: Two times a day (BID) | ORAL | Status: DC

## 2014-12-25 MED ORDER — GLIPIZIDE 5 MG PO TABS: 5.0000 mg | ORAL_TABLET | Freq: Two times a day (BID) | ORAL | Status: DC

## 2014-12-25 NOTE — Assessment & Plan Note (Signed)
DM well controlled; for refills of meds today. Consideration of ACEI for renal protection discussed with patient, held for today given her low-normal BP today.

## 2014-12-25 NOTE — Progress Notes (Signed)
   Subjective:    Patient ID: Norma Chambers, female    DOB: 11-25-1970, 44 y.o.   MRN: [id number]  HPI  Patient reports she had SAB eariler this month, no longer pregnant. Needs refills on medications for DM and HTN.  Was in seen by high risk OB and then lost the pregnancy.   C/o eyes fatigue with frequent use of TV or cell phone screen. Better when she stops these activities.   ROS no fevers or chills, no abd or pelvic pain.     Review of Systems     Objective:   Physical Exam Well appearing, no distress HEENT Neck supple. EOMI, PERRL.  No frontal tenderness COR regualr S1 S2 PULM Clear bilaterally.  EXTS no edema or tenderness in lower exts.       Assessment & Plan:

## 2014-12-25 NOTE — Progress Notes (Signed)
Patient lost baby after 3 months, baby passed 12-06-14, has paperwork with her.  Patient denies pain at this time.  Glucose monitor is not working properly.  Patient has pain in eyes when looking at tv or telephone for extended period of time.

## 2014-12-25 NOTE — Patient Instructions (Signed)
It was a pleasure to see you today.  Congratulations on the excellent control of your diabetes!  Refills were sent to our pharmacy for the coming 3 months.   Continue with your dietary control for diabetes.   Follow up here in 3 months.

## 2014-12-26 LAB — MICROALBUMIN / CREATININE URINE RATIO
CREATININE, URINE: 222.1 mg/dL
MICROALB UR: 1.8 mg/dL (ref <2.0)
Microalb Creat Ratio: 8.1 mg/g (ref 0.0–30.0)

## 2014-12-29 ENCOUNTER — Encounter: Payer: Self-pay | Admitting: *Deleted

## 2015-01-18 ENCOUNTER — Ambulatory Visit: Payer: Medicaid Other | Attending: Family Medicine

## 2015-01-18 ENCOUNTER — Other Ambulatory Visit: Payer: Self-pay | Admitting: Pharmacist

## 2015-01-18 MED ORDER — ACCU-CHEK AVIVA PLUS W/DEVICE KIT: PACK | Status: DC

## 2015-01-18 MED ORDER — GLUCOSE BLOOD VI STRP: ORAL_STRIP | Status: DC

## 2015-01-18 MED ORDER — TRUE METRIX METER W/DEVICE KIT: PACK | Status: DC

## 2015-01-18 MED ORDER — ACCU-CHEK SOFTCLIX LANCET DEV MISC: Status: DC

## 2015-02-04 ENCOUNTER — Other Ambulatory Visit: Payer: Self-pay | Admitting: Internal Medicine

## 2015-04-26 MED FILL — glipiZIDE 5 MG TABS: 5 | 30 days supply | Qty: 60 | Fill #3

## 2015-04-26 MED FILL — metFORMIN HCL 1000 MG TABS: 1000 | 30 days supply | Qty: 60 | Fill #2

## 2015-05-25 MED FILL — glipiZIDE 5 MG TABS: 5 | 30 days supply | Qty: 60 | Fill #4

## 2015-05-25 MED FILL — metFORMIN HCL 1000 MG TABS: 1000 | 30 days supply | Qty: 60 | Fill #3

## 2015-06-28 MED FILL — metFORMIN HCL 1000 MG TABS: 1000 | 30 days supply | Qty: 60 | Fill #4

## 2015-06-28 MED FILL — glipiZIDE 5 MG TABS: 5 | 30 days supply | Qty: 60 | Fill #5

## 2015-07-09 ENCOUNTER — Encounter: Payer: Self-pay | Admitting: Family Medicine

## 2015-07-09 ENCOUNTER — Ambulatory Visit: Payer: Medicaid Other | Attending: Family Medicine | Admitting: Family Medicine

## 2015-07-09 VITALS — BP 107/71 | HR 92 | Temp 99.0°F | Resp 16 | Ht 66.0 in | Wt 292.0 lb

## 2015-07-09 DIAGNOSIS — E139 Other specified diabetes mellitus without complications: Secondary | ICD-10-CM | POA: Diagnosis not present

## 2015-07-09 DIAGNOSIS — Z7984 Long term (current) use of oral hypoglycemic drugs: Secondary | ICD-10-CM | POA: Insufficient documentation

## 2015-07-09 DIAGNOSIS — Z6841 Body Mass Index (BMI) 40.0 and over, adult: Secondary | ICD-10-CM | POA: Insufficient documentation

## 2015-07-09 DIAGNOSIS — Z79899 Other long term (current) drug therapy: Secondary | ICD-10-CM | POA: Insufficient documentation

## 2015-07-09 DIAGNOSIS — Z9889 Other specified postprocedural states: Secondary | ICD-10-CM | POA: Diagnosis not present

## 2015-07-09 DIAGNOSIS — E669 Obesity, unspecified: Secondary | ICD-10-CM

## 2015-07-09 DIAGNOSIS — E119 Type 2 diabetes mellitus without complications: Secondary | ICD-10-CM | POA: Diagnosis not present

## 2015-07-09 LAB — CBC
HCT: 40.5 % (ref 35.0–45.0)
HEMOGLOBIN: 13.2 g/dL (ref 11.7–15.5)
MCH: 27.4 pg (ref 27.0–33.0)
MCHC: 32.6 g/dL (ref 32.0–36.0)
MCV: 84.2 fL (ref 80.0–100.0)
MPV: 10 fL (ref 7.5–12.5)
Platelets: 240 10*3/uL (ref 140–400)
RBC: 4.81 MIL/uL (ref 3.80–5.10)
RDW: 14.8 % (ref 11.0–15.0)
WBC: 11.5 10*3/uL — ABNORMAL HIGH (ref 3.8–10.8)

## 2015-07-09 LAB — COMPLETE METABOLIC PANEL WITH GFR
ALT: 50 U/L — ABNORMAL HIGH (ref 6–29)
AST: 46 U/L — ABNORMAL HIGH (ref 10–30)
Albumin: 4.3 g/dL (ref 3.6–5.1)
Alkaline Phosphatase: 62 U/L (ref 33–115)
BILIRUBIN TOTAL: 0.2 mg/dL (ref 0.2–1.2)
BUN: 8 mg/dL (ref 7–25)
CHLORIDE: 104 mmol/L (ref 98–110)
CO2: 21 mmol/L (ref 20–31)
Calcium: 8.9 mg/dL (ref 8.6–10.2)
Creat: 0.43 mg/dL — ABNORMAL LOW (ref 0.50–1.10)
GFR, Est African American: >89 mL/min (ref >=60)
GLUCOSE: 58 mg/dL — AB (ref 65–99)
POTASSIUM: 5 mmol/L (ref 3.5–5.3)
SODIUM: 137 mmol/L (ref 135–146)
TOTAL PROTEIN: 7.5 g/dL (ref 6.1–8.1)

## 2015-07-09 LAB — POCT GLYCOSYLATED HEMOGLOBIN (HGB A1C): HEMOGLOBIN A1C: 6.5

## 2015-07-09 LAB — GLUCOSE, POCT (MANUAL RESULT ENTRY): POC Glucose: 101 mg/dl — AB (ref 70–99)

## 2015-07-09 MED ORDER — SITAGLIPTIN PHOSPHATE 100 MG PO TABS: 100.0000 mg | ORAL_TABLET | Freq: Every day | ORAL | Status: DC

## 2015-07-09 MED FILL — JANUVIA 100 MG TABLET: 100 | 30 days supply | Qty: 30 | Fill #0

## 2015-07-09 NOTE — Progress Notes (Signed)
F/U DM  Glucose running 92-204 Taking medication as prescribed  No pain today  No tobacco user  No suicidal thoughts in the past two weeks

## 2015-07-09 NOTE — Progress Notes (Signed)
LOGO@  Subjective:  Patient ID: Norma Chambers, female    DOB: 08-24-1970  Age: 45 y.o. MRN: [id number]  CC: Diabetes   HPI Norma Chambers presents for   1. CHRONIC DIABETES  Disease Monitoring  Blood Sugar Ranges: 92-204  Polyuria: no   Visual problems: no   Medication Compliance: yes  Medication Side Effects  Hypoglycemia: no   Preventitive Health Care  Eye Exam: due   Foot Exam: done today    2. Morbid obesity: since birth of second child in 2012. She eats low carb. She takes metformin. Minimal exercise.   Social History  Substance Use Topics  . Smoking status: Never Smoker   . Smokeless tobacco: Never Used  . Alcohol Use: No    Past Medical History  Diagnosis Date  . Gestational diabetes     Past Surgical History  Procedure Laterality Date  . Cesarean section    . Cesarean section N/A 11/18/2012    Procedure: REPEAT CESAREAN SECTION;  Surgeon: Mora Bellman, MD;  Location: [place] [facility];  Service: Obstetrics;  Laterality: N/A;    Family History  Problem Relation Age of Onset  . Diabetes Mother   . Diabetes Maternal Grandmother     Social History  Substance Use Topics  . Smoking status: Never Smoker   . Smokeless tobacco: Never Used  . Alcohol Use: No    ROS Review of Systems  Constitutional: Negative for fever and chills.  Eyes: Negative for visual disturbance.  Respiratory: Negative for shortness of breath.   Cardiovascular: Negative for chest pain.  Gastrointestinal: Negative for abdominal pain and blood in stool.  Musculoskeletal: Negative for back pain and arthralgias.  Skin: Negative for rash.  Allergic/Immunologic: Negative for immunocompromised state.  Hematological: Negative for adenopathy. Does not bruise/bleed easily.  Psychiatric/Behavioral: Negative for suicidal ideas and dysphoric mood.    Objective:   Today's Vitals: BP 107/71 mmHg  Pulse 92  Temp(Src) 99 F (37.2 C) (Oral)  Resp 16  Ht 5' 6"  (1.676 m)  Wt 292 lb (132.45 kg)   BMI 47.15 kg/m2  SpO2 98%  LMP 06/14/2015  Physical Exam  Constitutional: She is oriented to person, place, and time. She appears well-developed and well-nourished. No distress.  HENT:  Head: Normocephalic and atraumatic.  Cardiovascular: Normal rate, regular rhythm, normal heart sounds and intact distal pulses.   Pulmonary/Chest: Effort normal and breath sounds normal.  Musculoskeletal: She exhibits no edema.  Neurological: She is alert and oriented to person, place, and time.  Skin: Skin is warm and dry. No rash noted.  Psychiatric: She has a normal mood and affect.   Lab Results  Component Value Date   HGBA1C 6.5 07/09/2015   CBG  101 Assessment & Plan:   Problem List Items Addressed This Visit    Obesity (Chronic)   Relevant Medications   sitaGLIPtin (JANUVIA) 100 MG tablet   Other Relevant Orders   Referral to Nutrition and Diabetes Services   TSH   COMPLETE METABOLIC PANEL WITH GFR   CBC   DM (diabetes mellitus) ([place]) - Primary (Chronic)   Relevant Medications   sitaGLIPtin (JANUVIA) 100 MG tablet   Other Relevant Orders   HgB A1c (Completed)   Glucose (CBG) (Completed)   Ambulatory referral to Ophthalmology   Referral to Nutrition and Diabetes Services      Outpatient Encounter Prescriptions as of 07/09/2015  Medication Sig  . Blood Glucose Monitoring Suppl (ACCU-CHEK AVIVA PLUS) W/DEVICE KIT USE AS INSTRUCTED  . glipiZIDE (GLUCOTROL) 5  MG tablet Take 1 tablet (5 mg total) by mouth 2 (two) times daily before a meal.  . glucose blood (ACCU-CHEK AVIVA PLUS) test strip Use as instructed  . Lancet Devices (ACCU-CHEK SOFTCLIX) lancets Use as instructed  . metFORMIN (GLUCOPHAGE) 1000 MG tablet Take 1 tablet (1,000 mg total) by mouth 2 (two) times daily with a meal.  . [DISCONTINUED] oxyCODONE-acetaminophen (PERCOCET/ROXICET) 5-325 MG per tablet Take 1-2 tablets by mouth every 4 (four) hours as needed.  . [DISCONTINUED] Prenatal Vit-Fe Fumarate-FA  (MULTIVITAMIN-PRENATAL) 27-0.8 MG TABS Take 1 tablet by mouth daily at 12 noon.   No facility-administered encounter medications on file as of 07/09/2015.    Follow-up: No Follow-up on file.    Boykin Nearing MD

## 2015-07-09 NOTE — Assessment & Plan Note (Signed)
Obese x 5 years Diabetic  Plan: Exercise Continue CBG control Low carb diet Nutrition/DM edu

## 2015-07-09 NOTE — Patient Instructions (Signed)
Barbaraann Shareajat was seen today for diabetes.  Diagnoses and all orders for this visit:  Type 2 diabetes mellitus without complication, without long-term current use of insulin (HCC) -     HgB A1c -     Glucose (CBG) -     sitaGLIPtin (JANUVIA) 100 MG tablet; Take 1 tablet (100 mg total) by mouth daily. -     Ambulatory referral to Ophthalmology -     Referral to Nutrition and Diabetes Services  Obesity -     Referral to Nutrition and Diabetes Services -     TSH; Standing -     COMPLETE METABOLIC PANEL WITH GFR -     CBC -     TSH   If you are able to get [place]uvia. Take this instead of glipizide.   F/u in 3 months for diabetes  Dr. Armen PickupFunches

## 2015-07-09 NOTE — Assessment & Plan Note (Signed)
Well controlled  Adding januvia to replace glipizide to help with weight loss

## 2015-07-12 MED ORDER — METFORMIN HCL 1000 MG PO TABS: 1000.0000 mg | ORAL_TABLET | Freq: Two times a day (BID) | ORAL | Status: DC

## 2015-07-12 NOTE — Addendum Note (Signed)
Addended by: Dessa PhiFUNCHES, Cordaro Mukai on: 07/12/2015 08:29 AM   Modules accepted: Orders

## 2015-07-13 ENCOUNTER — Telehealth: Payer: Self-pay | Admitting: *Deleted

## 2015-07-13 NOTE — Telephone Encounter (Signed)
-----   Message from Dessa PhiJosalyn Funches, MD sent at 07/12/2015  8:28 AM EDT ----- Slight elevated WBC  Low glucose on CMP  Elevated liver enzymes, suspect fatty liver   Please stop glipizide due to risk of low blood sugar in setting of well controlled diabetes Start januvia if able to get it with med assistance from pharmacy Work on weight loss

## 2015-07-13 NOTE — Telephone Encounter (Signed)
Date of birth verified by pt  Lab results given  Elevated liver enzymes.  Stop Glipizide due to risk of low blood sugar  Start Januvia. Need to apply for med assistance  Work on [employer] loss

## 2015-07-26 MED FILL — metFORMIN HCL 1000 MG TABS: 1000 | 30 days supply | Qty: 60 | Fill #5

## 2015-07-28 MED FILL — ACCU-CHEK AVIVA PLUS TEST S: 25 days supply | Qty: 100 | Fill #1

## 2015-08-12 ENCOUNTER — Encounter: Payer: Self-pay | Admitting: Dietician

## 2015-08-12 ENCOUNTER — Encounter: Payer: Medicaid Other | Attending: Family Medicine | Admitting: Dietician

## 2015-08-12 DIAGNOSIS — E119 Type 2 diabetes mellitus without complications: Secondary | ICD-10-CM | POA: Diagnosis present

## 2015-08-12 MED FILL — JANUVIA 100 MG TABLET: 100 | 30 days supply | Qty: 30 | Fill #1

## 2015-08-12 NOTE — Patient Instructions (Signed)
Plan:  Aim for 3 Carb Choices per meal (45 grams) +/- 1 either way  Aim for 0-1 Carbs per snack if hungry  Include protein in moderation with your meals and snacks Consider reading food labels for Total Carbohydrate and Fat Grams of foods Consider  increasing your activity level by walking, biking, or swimming for 15 minutes daily as tolerated.  Continue to increase your exercise as tolerated to at least 30 minutes most days. Consider checking BG at alternate times per day as directed by MD  Consider taking medication as directed by MD

## 2015-08-12 NOTE — Progress Notes (Signed)
Diabetes Self-Management Education  Visit Type: First/Initial  Appt. Start Time: 1045 Appt. End Time: 1200  08/12/2015  Norma Chambers, identified by name and date of birth, is a 45 y.o. female with a diagnosis of Diabetes: Type 2.  She would like to learn how to lose weight and control her blood sugar.  She states that she has a healthy diet but does not exercise.  States that she is tired all of the time.  Patient lives with her husband and 3 children ages 50, 31 and 30. She does the shopping and cooking. She is from [place] and speaks, Aerobic, [place], and [place].  She will fast for Ramadan.  ASSESSMENT  Height  (1.6 m), weight 293 lb (132.904 kg), unknown if currently breastfeeding. Body mass index is 51.92 kg/(m^2).  Weight Hx: Lowest adult weight:  172 lbs 192 lbs prior to birth of first daughter 296 lbs highest weight.      Diabetes Self-Management Education - 08/12/15 1058    Visit Information   Visit Type First/Initial   Initial Visit   Diabetes Type Type 2   Are you currently following a meal plan? Yes   What type of meal plan do you follow? decreased carohydrate, increased fruits and vegetables   Are you taking your medications as prescribed? Yes   Date Diagnosed 07/2015   Health Coping   How would you rate your overall health? Fair   Psychosocial Assessment   Patient Belief/Attitude about Diabetes Afraid   Self-care barriers None   Self-management support Doctor's office   Other persons present Patient   Patient Concerns Weight Control;Glycemic Control;Nutrition/Meal planning   Special Needs Other (comment)  simplified.  small language barier.  refused interpretor.   Preferred Learning Style No preference indicated   Learning Readiness Ready   How often do you need to have someone help you when you read instructions, pamphlets, or other written materials from your doctor or pharmacy? 1 - Never   What is the last grade level you completed in school? 12th  grade   Pre-Education Assessment   Patient understands the diabetes disease and treatment process. Needs Review   Patient understands incorporating nutritional management into lifestyle. Needs Instruction   Patient undertands incorporating physical activity into lifestyle. Needs Instruction   Patient understands using medications safely. Demonstrates understanding / competency   Patient understands monitoring blood glucose, interpreting and using results Demonstrates understanding / competency   Patient understands prevention, detection, and treatment of acute complications. Demonstrates understanding / competency   Patient understands prevention, detection, and treatment of chronic complications. Demonstrates understanding / competency   Patient understands how to develop strategies to address psychosocial issues. Needs Review   Patient understands how to develop strategies to promote health/change behavior. Needs Review   Complications   Last HgB A1C per patient/outside source 6.5 %  07/09/15   How often do you check your blood sugar? 1-2 times/day   Fasting Blood glucose range (mg/dL) 16-109   Postprandial Blood glucose range (mg/dL) 604-540   Number of hypoglycemic episodes per month 0   Number of hyperglycemic episodes per week 1   Can you tell when your blood sugar is high? No   Have you had a dilated eye exam in the past 12 months? Yes   Have you had a dental exam in the past 12 months? Yes   Are you checking your feet? Yes   How many days per week are you checking your feet? 6  Dietary Intake   Breakfast cereal and milk OR toast with cream cheese and jam OR plain oatmeal and milk  7:30-8   Lunch chicken or beef, vegetables, salad, occasional couscous  2-3   Snack (afternoon) fruit or cracker   Dinner cereal with milk or nothing   Beverage(s) water, coffee with 1% milk, occasional fresh juice   Exercise   Exercise Type ADL's   How many days per week to you exercise? 0   How  many minutes per day do you exercise? 0   Total minutes per week of exercise 0   Patient Education   Disease state  Definition of diabetes, type 1 and 2, and the diagnosis of diabetes   Nutrition management  Role of diet in the treatment of diabetes and the relationship between the three main macronutrients and blood glucose level;Food label reading, portion sizes and measuring food.;Meal options for control of blood glucose level and chronic complications.   Physical activity and exercise  Role of exercise on diabetes management, blood pressure control and cardiac health.   Acute complications Taught treatment of hypoglycemia - the 15 rule.   Psychosocial adjustment Worked with patient to identify barriers to care and solutions;Role of stress on diabetes   Individualized Goals (developed by patient)   Nutrition General guidelines for healthy choices and portions discussed   Physical Activity Exercise 5-7 days per week;30 minutes per day   Monitoring  test my blood glucose as discussed   Reducing Risk do foot checks daily;treat hypoglycemia with 15 grams of carbs if blood glucose less than 70mg /dL   Health Coping Not Applicable   Post-Education Assessment   Patient understands the diabetes disease and treatment process. Demonstrates understanding / competency   Patient understands incorporating nutritional management into lifestyle. Demonstrates understanding / competency   Patient undertands incorporating physical activity into lifestyle. Demonstrates understanding / competency   Patient understands using medications safely. Demonstrates understanding / competency   Patient understands monitoring blood glucose, interpreting and using results Demonstrates understanding / competency   Patient understands prevention, detection, and treatment of acute complications. Demonstrates understanding / competency   Patient understands prevention, detection, and treatment of chronic complications.  Demonstrates understanding / competency   Patient understands how to develop strategies to address psychosocial issues. Demonstrates understanding / competency   Patient understands how to develop strategies to promote health/change behavior. Demonstrates understanding / competency   Outcomes   Expected Outcomes Demonstrated interest in learning. Expect positive outcomes   Future DMSE PRN   Program Status Completed      Individualized Plan for Diabetes Self-Management Training:   Learning Objective:  Patient will have a greater understanding of diabetes self-management. Patient education plan is to attend individual and/or group sessions per assessed needs and concerns.   Plan:   Patient Instructions  Plan:  Aim for 3 Carb Choices per meal (45 grams) +/- 1 either way  Aim for 0-1 Carbs per snack if hungry  Include protein in moderation with your meals and snacks Consider reading food labels for Total Carbohydrate and Fat Grams of foods Consider  increasing your activity level by walking, biking, or swimming for 15 minutes daily as tolerated.  Continue to increase your exercise as tolerated to at least 30 minutes most days. Consider checking BG at alternate times per day as directed by MD  Consider taking medication as directed by MD    Expected Outcomes:  Demonstrated interest in learning. Expect positive outcomes  Education material provided: Living Well  with Diabetes, Food label handouts, A1C conversion sheet, Meal plan card and My Plate  If problems or questions, patient to contact team via:  Phone  Future DSME appointment: PRN

## 2015-08-13 ENCOUNTER — Ambulatory Visit: Payer: Medicaid Other | Admitting: *Deleted

## 2015-08-14 ENCOUNTER — Encounter (HOSPITAL_COMMUNITY): Payer: Self-pay | Admitting: Emergency Medicine

## 2015-08-14 ENCOUNTER — Ambulatory Visit (HOSPITAL_COMMUNITY)
Admission: EM | Admit: 2015-08-14 | Discharge: 2015-08-14 | Disposition: A | Discharge status: Home / Self Care | Payer: Medicaid Other | Attending: Emergency Medicine | Admitting: Emergency Medicine

## 2015-08-14 DIAGNOSIS — K1379 Other lesions of oral mucosa: Secondary | ICD-10-CM

## 2015-08-14 LAB — POCT RAPID STREP A: Streptococcus, Group A Screen (Direct): NEGATIVE

## 2015-08-14 MED ORDER — AMOXICILLIN 875 MG PO TABS: 875.0000 mg | ORAL_TABLET | Freq: Two times a day (BID) | ORAL | Status: DC

## 2015-08-14 NOTE — ED Notes (Signed)
The patient presented to the [facility] with a complaint of a sore throat x 2 days. 

## 2015-08-14 NOTE — Discharge Instructions (Signed)
It appears your uvula is infected. Take amoxicillin twice a day for 10 days. Do salt water gargles 3 times a day. Follow-up as needed.

## 2015-08-14 NOTE — ED Provider Notes (Signed)
CSN: [id number]     Arrival date & time 08/14/15  1416 History   First MD Initiated Contact with Patient 08/14/15 1505     Chief Complaint  Patient presents with  . Sore Throat   (Consider location/radiation/quality/duration/timing/severity/associated sxs/prior Treatment) HPI She is a 45 year old woman here for evaluation of sore throat. Her symptoms started yesterday. She denies any nasal congestion or rhinorrhea. No cough. No fevers. Pain is worse with swallowing.  Past Medical History  Diagnosis Date  . Gestational diabetes    Past Surgical History  Procedure Laterality Date  . Cesarean section    . Cesarean section N/A 11/18/2012    Procedure: REPEAT CESAREAN SECTION;  Surgeon: Mora Bellman, MD;  Location: [facility];  Service: Obstetrics;  Laterality: N/A;   Family History  Problem Relation Age of Onset  . Diabetes Mother   . Diabetes Maternal Grandmother    Social History  Substance Use Topics  . Smoking status: Never Smoker   . Smokeless tobacco: Never Used  . Alcohol Use: No   OB History    Gravida Para Term Preterm AB TAB SAB Ectopic Multiple Living   _0 Review of Systems As in history of present illness Allergies  Review of patient's allergies indicates no known allergies.  Home Medications   Prior to Admission medications   Medication Sig Start Date End Date Taking? Authorizing Provider  Blood Glucose Monitoring Suppl (ACCU-CHEK AVIVA PLUS) W/DEVICE KIT USE AS INSTRUCTED 01/18/15  Yes Josalyn Funches, MD  glucose blood (ACCU-CHEK AVIVA PLUS) test strip Use as instructed 01/18/15  Yes Boykin Nearing, MD  Lancet Devices [facility]) lancets Use as instructed 01/18/15  Yes Josalyn Funches, MD  medroxyPROGESTERone (DEPO-PROVERA) 150 MG/ML injection Inject 150 mg into the muscle every 3 (three) months. Reported on 08/12/2015   Yes Historical Provider, MD  metFORMIN (GLUCOPHAGE) 1000 MG tablet Take 1 tablet (1,000 mg total) by mouth 2 (two)  times daily with a meal. 07/12/15  Yes Josalyn Funches, MD  sitaGLIPtin (JANUVIA) 100 MG tablet Take 1 tablet (100 mg total) by mouth daily. 07/09/15  Yes Josalyn Funches, MD  amoxicillin (AMOXIL) 875 MG tablet Take 1 tablet (875 mg total) by mouth 2 (two) times daily. 08/14/15   Melony Overly, MD   Meds Ordered and Administered this Visit  Medications - No data to display  BP 135/71 mmHg  Pulse 88  Temp(Src) 98.7 F (37.1 C) (Oral)  Resp 20  SpO2 99%  LMP 08/06/2015 (Exact Date)  Breastfeeding? No No data found.   Physical Exam  Constitutional: She is oriented to person, place, and time. She appears well-developed and well-nourished.  HENT:  Mouth/Throat: No oropharyngeal exudate.  Tonsils are minimally erythematous. Her uvula is quite erythematous and swollen. No abscess appreciated.  Neck: Neck supple.  Cardiovascular: Normal rate, regular rhythm and normal heart sounds.   No murmur heard. Pulmonary/Chest: Effort normal and breath sounds normal. No respiratory distress. She has no wheezes. She has no rales.  Lymphadenopathy:    She has no cervical adenopathy.  Neurological: She is alert and oriented to person, place, and time.    ED Course  Procedures (including critical care time)  Labs Review Labs Reviewed  POCT RAPID STREP A    Imaging Review No results found.   MDM   1. Uvular swelling    We'll treat with amoxicillin. Follow-up as needed.    Melony Overly, MD  08/14/15 1531

## 2015-08-17 LAB — CULTURE, GROUP A STREP (THRC)

## 2015-08-25 MED FILL — metFORMIN HCL 1000 MG TABS: 1000 | 30 days supply | Qty: 60 | Fill #6

## 2015-09-06 MED FILL — JANUVIA 100 MG TABLET: 100 | 30 days supply | Qty: 30 | Fill #2

## 2015-09-06 MED FILL — ACCU-CHEK AVIVA PLUS TEST S: 25 days supply | Qty: 100 | Fill #2

## 2015-09-17 ENCOUNTER — Ambulatory Visit: Payer: Medicaid Other | Admitting: *Deleted

## 2015-10-06 MED FILL — JANUVIA 100 MG TABLET: 100 | 30 days supply | Qty: 30 | Fill #3

## 2015-10-06 MED FILL — metFORMIN HCL 1000 MG TABS: 1000 | 30 days supply | Qty: 60 | Fill #7

## 2015-11-05 MED FILL — JANUVIA 100 MG TABLET: 100 | 30 days supply | Qty: 30 | Fill #4

## 2015-11-05 MED FILL — metFORMIN HCL 1000 MG TABS: 1000 | 30 days supply | Qty: 60 | Fill #8

## 2015-12-07 MED FILL — JANUVIA 100 MG TABLET: 100 | 30 days supply | Qty: 30 | Fill #5

## 2015-12-07 MED FILL — metFORMIN HCL 1000 MG TABS: 1000 | 30 days supply | Qty: 60 | Fill #9

## 2016-01-06 ENCOUNTER — Other Ambulatory Visit: Payer: Self-pay | Admitting: Family Medicine

## 2016-01-06 DIAGNOSIS — E119 Type 2 diabetes mellitus without complications: Secondary | ICD-10-CM

## 2016-01-06 MED FILL — metFORMIN HCL 1000 MG TABS: 1000 | 30 days supply | Qty: 60 | Fill #0

## 2016-01-06 MED FILL — JANUVIA 100 MG TABLET: 100 | 30 days supply | Qty: 30 | Fill #0

## 2016-01-17 ENCOUNTER — Ambulatory Visit: Payer: Medicaid Other | Attending: Internal Medicine

## 2016-02-11 ENCOUNTER — Other Ambulatory Visit: Payer: Self-pay | Admitting: Family Medicine

## 2016-02-11 DIAGNOSIS — E119 Type 2 diabetes mellitus without complications: Secondary | ICD-10-CM

## 2016-02-11 MED FILL — metFORMIN HCL 1000 MG TABS: 1000 | 30 days supply | Qty: 60 | Fill #1

## 2016-03-06 ENCOUNTER — Encounter: Payer: Self-pay | Admitting: Family Medicine

## 2016-03-06 ENCOUNTER — Ambulatory Visit: Payer: Self-pay | Attending: Family Medicine | Admitting: Family Medicine

## 2016-03-06 VITALS — BP 116/79 | HR 105 | Temp 98.3°F | Ht 64.0 in | Wt 293.4 lb

## 2016-03-06 DIAGNOSIS — Z6841 Body Mass Index (BMI) 40.0 and over, adult: Secondary | ICD-10-CM | POA: Insufficient documentation

## 2016-03-06 DIAGNOSIS — E119 Type 2 diabetes mellitus without complications: Secondary | ICD-10-CM | POA: Insufficient documentation

## 2016-03-06 LAB — GLUCOSE, POCT (MANUAL RESULT ENTRY): POC Glucose: 232 mg/dl — AB (ref 70–99)

## 2016-03-06 LAB — POCT GLYCOSYLATED HEMOGLOBIN (HGB A1C): HEMOGLOBIN A1C: 8.2

## 2016-03-06 MED ORDER — SITAGLIPTIN PHOSPHATE 100 MG PO TABS: 100.0000 mg | ORAL_TABLET | Freq: Every day | ORAL | 11 refills | Status: DC

## 2016-03-06 MED ORDER — GLIPIZIDE ER 5 MG PO TB24: 5.0000 mg | ORAL_TABLET | Freq: Every day | ORAL | 11 refills | Status: DC

## 2016-03-06 MED ORDER — METFORMIN HCL 1000 MG PO TABS: 1000.0000 mg | ORAL_TABLET | Freq: Two times a day (BID) | ORAL | 11 refills | Status: DC

## 2016-03-06 MED FILL — glipiZIDE XL 5 MG TB24: 5 | 30 days supply | Qty: 30 | Fill #0

## 2016-03-06 MED FILL — metFORMIN HCL 1000 MG TABS: 1000 | 30 days supply | Qty: 60 | Fill #0

## 2016-03-06 NOTE — Progress Notes (Signed)
Pt is here to follow up on Diabetes, and medication refills.  Pt needs refills on januvia.

## 2016-03-06 NOTE — Patient Instructions (Signed)
Barbaraann Shareajat was seen today for diabetes.  Diagnoses and all orders for this visit:  Type 2 diabetes mellitus without complication, without long-term current use of insulin (HCC) -     POCT glucose (manual entry) -     POCT glycosylated hemoglobin (Hb A1C) -     Microalbumin/Creatinine Ratio, Urine -     sitaGLIPtin (JANUVIA) 100 MG tablet; Take 1 tablet (100 mg total) by mouth daily. -     metFORMIN (GLUCOPHAGE) 1000 MG tablet; Take 1 tablet (1,000 mg total) by mouth 2 (two) times daily with a meal. -     glipiZIDE (GLIPIZIDE XL) 5 MG 24 hr tablet; Take 1 tablet (5 mg total) by mouth daily with breakfast.   F/u in 1 month for pap smear  Dr. Armen PickupFunches

## 2016-03-06 NOTE — Assessment & Plan Note (Signed)
Type 2 diabetes, decline in control in setting of not having januvia, d.c glipizide ( 5 mg IR BID) at last OV due to low sugars, overeating.  Plan: Refilled metformin and januvia Restart glipizide 5 mg XL once daily with food

## 2016-03-06 NOTE — Progress Notes (Signed)
LOGO@  Subjective:  Patient ID: Norma Chambers, female    DOB: May 25, 1970  Age: 45 y.o. MRN: [id number]  CC: Diabetes   HPI Stehanie Ekstrom presents for   1. CHRONIC DIABETES  Disease Monitoring  Blood Sugar Ranges: 180-240  Polyuria: no   Visual problems: no   Medication Compliance: yes with metformin, ran out of [place] 1 month ago  Medication Side Effects  Hypoglycemia: no   Preventitive Health Care  Eye Exam: due   Foot Exam: done today    2. Morbid obesity: since birth of second child in 2012. She eats low carb. She reports having a large appetite. She takes metformin. Minimal exercise.   Social History  Substance Use Topics  . Smoking status: Never Smoker  . Smokeless tobacco: Never Used  . Alcohol use No    Past Medical History:  Diagnosis Date  . Gestational diabetes     Past Surgical History:  Procedure Laterality Date  . CESAREAN SECTION    . CESAREAN SECTION N/A 11/18/2012   Procedure: REPEAT CESAREAN SECTION;  Surgeon: Mora Bellman, MD;  Location: [facility];  Service: Obstetrics;  Laterality: N/A;    Family History  Problem Relation Age of Onset  . Diabetes Mother   . Diabetes Maternal Grandmother     Social History  Substance Use Topics  . Smoking status: Never Smoker  . Smokeless tobacco: Never Used  . Alcohol use No    ROS Review of Systems  Constitutional: Negative for chills and fever.  Eyes: Negative for visual disturbance.  Respiratory: Negative for shortness of breath.   Cardiovascular: Negative for chest pain.  Gastrointestinal: Negative for abdominal pain and blood in stool.  Musculoskeletal: Negative for arthralgias and back pain.  Skin: Negative for rash.  Allergic/Immunologic: Negative for immunocompromised state.  Hematological: Negative for adenopathy. Does not bruise/bleed easily.  Psychiatric/Behavioral: Negative for dysphoric mood and suicidal ideas.    Objective:   Today's Vitals: BP 116/79 (BP Location: Left Arm,  Patient Position: Sitting, Cuff Size: Large)   Pulse (!) 105   Temp 98.3 F (36.8 C) (Oral)   Ht 5' 4"  (1.626 m)   Wt 293 lb 6.4 oz (133.1 kg)   LMP 02/12/2016   SpO2 98%   BMI 50.36 kg/m  Wt Readings from Last 3 Encounters:  03/06/16 293 lb 6.4 oz (133.1 kg)  08/12/15 293 lb (132.9 kg)  07/09/15 292 lb (132.5 kg)    Physical Exam  Constitutional: She is oriented to person, place, and time. She appears well-developed and well-nourished. No distress.  HENT:  Head: Normocephalic and atraumatic.  Cardiovascular: Normal rate, regular rhythm, normal heart sounds and intact distal pulses.   Pulmonary/Chest: Effort normal and breath sounds normal.  Musculoskeletal: She exhibits no edema.  Neurological: She is alert and oriented to person, place, and time.  Skin: Skin is warm and dry. No rash noted.  Psychiatric: She has a normal mood and affect.   Lab Results  Component Value Date   HGBA1C 6.5 07/09/2015   Lab Results  Component Value Date   HGBA1C 8.2 03/06/2016    CBG  232  Assessment & Plan:   Problem List Items Addressed This Visit      High   DM (diabetes mellitus) ([place]) - Primary (Chronic)   Relevant Medications   sitaGLIPtin (JANUVIA) 100 MG tablet   metFORMIN (GLUCOPHAGE) 1000 MG tablet   glipiZIDE (GLIPIZIDE XL) 5 MG 24 hr tablet   Other Relevant Orders   POCT glucose (  manual entry) (Completed)   POCT glycosylated hemoglobin (Hb A1C) (Completed)   Microalbumin/Creatinine Ratio, Urine      Outpatient Encounter Prescriptions as of 03/06/2016  Medication Sig  . Blood Glucose Monitoring Suppl (ACCU-CHEK AVIVA PLUS) W/DEVICE KIT USE AS INSTRUCTED  . glucose blood (ACCU-CHEK AVIVA PLUS) test strip Use as instructed  . Lancet Devices (ACCU-CHEK SOFTCLIX) lancets Use as instructed  . metFORMIN (GLUCOPHAGE) 1000 MG tablet Take 1 tablet (1,000 mg total) by mouth 2 (two) times daily with a meal.  . sitaGLIPtin (JANUVIA) 100 MG tablet Take 1 tablet (100 mg total) by  mouth daily.  . [DISCONTINUED] JANUVIA 100 MG tablet TAKE 1 TABLET BY MOUTH DAILY.  . [DISCONTINUED] metFORMIN (GLUCOPHAGE) 1000 MG tablet Take 1 tablet (1,000 mg total) by mouth 2 (two) times daily with a meal.  . glipiZIDE (GLIPIZIDE XL) 5 MG 24 hr tablet Take 1 tablet (5 mg total) by mouth daily with breakfast.  . [DISCONTINUED] amoxicillin (AMOXIL) 875 MG tablet Take 1 tablet (875 mg total) by mouth 2 (two) times daily. (Patient not taking: Reported on 03/06/2016)  . [DISCONTINUED] medroxyPROGESTERone (DEPO-PROVERA) 150 MG/ML injection Inject 150 mg into the muscle every 3 (three) months. Reported on 08/12/2015   No facility-administered encounter medications on file as of 03/06/2016.     Follow-up: Return in about 4 weeks (around 04/03/2016) for pap smear .    Boykin Nearing MD

## 2016-03-07 LAB — MICROALBUMIN / CREATININE URINE RATIO
CREATININE, URINE: 37 mg/dL (ref 20–320)
Microalb Creat Ratio: 8 mcg/mg creat (ref <30)
Microalb, Ur: 0.3 mg/dL

## 2016-03-08 ENCOUNTER — Telehealth: Payer: Self-pay

## 2016-03-08 NOTE — Telephone Encounter (Signed)
Pt was called and informed of lab results. 

## 2016-04-04 MED FILL — glipiZIDE XL 5 MG TB24: 5 | 30 days supply | Qty: 30 | Fill #1

## 2016-04-04 MED FILL — metFORMIN HCL 1000 MG TABS: 1000 | 30 days supply | Qty: 60 | Fill #1

## 2016-05-03 ENCOUNTER — Ambulatory Visit: Payer: Self-pay | Attending: Family Medicine

## 2016-05-05 MED FILL — metFORMIN HCL 1000 MG TABS: 1000 | 30 days supply | Qty: 60 | Fill #2

## 2016-05-05 MED FILL — glipiZIDE XL 5 MG TB24: 5 | 30 days supply | Qty: 30 | Fill #2

## 2016-06-06 MED FILL — metFORMIN HCL 1000 MG TABS: 1000 | 30 days supply | Qty: 60 | Fill #3

## 2016-06-06 MED FILL — glipiZIDE XL 5 MG TB24: 5 | 30 days supply | Qty: 30 | Fill #3

## 2016-07-03 MED FILL — metFORMIN HCL 1000 MG TABS: 1000 | 30 days supply | Qty: 60 | Fill #4

## 2016-07-03 MED FILL — glipiZIDE XL 5 MG TB24: 5 | 30 days supply | Qty: 30 | Fill #4

## 2016-07-27 MED FILL — glipiZIDE XL 5 MG TB24: 5 | 30 days supply | Qty: 30 | Fill #5

## 2016-07-27 MED FILL — metFORMIN HCL 1000 MG TABS: 1000 | 30 days supply | Qty: 60 | Fill #5

## 2016-08-16 ENCOUNTER — Encounter: Payer: Self-pay | Admitting: Family Medicine

## 2016-08-30 MED FILL — metFORMIN HCL 1000 MG TABS: 1000 | 30 days supply | Qty: 60 | Fill #6

## 2016-09-12 MED FILL — glipiZIDE XL 5 MG TB24: 5 | 30 days supply | Qty: 30 | Fill #6

## 2016-10-11 MED FILL — glipiZIDE XL 5 MG TB24: 5 | 30 days supply | Qty: 30 | Fill #7

## 2016-10-11 MED FILL — ?METFORMIN HCL 1,000 MG TAB: 1000 | 30 days supply | Qty: 60 | Fill #7

## 2016-10-31 ENCOUNTER — Other Ambulatory Visit: Payer: Self-pay | Admitting: Family Medicine

## 2016-11-02 ENCOUNTER — Other Ambulatory Visit: Payer: Self-pay | Admitting: Family Medicine

## 2016-11-02 MED ORDER — TRUE METRIX METER W/DEVICE KIT: PACK | 0 refills | Status: DC

## 2016-11-02 MED ORDER — TRUEPLUS LANCETS 28G MISC: 12 refills | Status: DC

## 2016-11-02 MED ORDER — GLUCOSE BLOOD VI STRP: ORAL_STRIP | 12 refills | Status: DC

## 2016-11-13 ENCOUNTER — Other Ambulatory Visit: Payer: Self-pay | Admitting: Family Medicine

## 2016-11-13 MED ORDER — GLUCOSE BLOOD VI STRP: ORAL_STRIP | 12 refills | Status: DC

## 2016-11-13 MED FILL — glipiZIDE XL 5 MG TB24: 5 | 30 days supply | Qty: 30 | Fill #8

## 2016-11-13 MED FILL — ?METFORMIN HCL 1,000 MG TAB: 1000 | 30 days supply | Qty: 60 | Fill #8

## 2016-11-29 ENCOUNTER — Ambulatory Visit: Payer: Self-pay

## 2016-11-29 ENCOUNTER — Ambulatory Visit: Payer: Self-pay | Attending: Internal Medicine

## 2016-12-06 ENCOUNTER — Other Ambulatory Visit: Payer: Self-pay | Admitting: Family Medicine

## 2016-12-06 MED FILL — glipiZIDE XL 5 MG TB24: 5 | 30 days supply | Qty: 30 | Fill #9

## 2016-12-06 MED FILL — ?METFORMIN HCL 1,000 MG TAB: 1000 | 30 days supply | Qty: 60 | Fill #9

## 2017-01-07 ENCOUNTER — Encounter (HOSPITAL_COMMUNITY): Payer: Self-pay | Admitting: *Deleted

## 2017-01-07 ENCOUNTER — Emergency Department (HOSPITAL_COMMUNITY)
Admission: EM | Admit: 2017-01-07 | Discharge: 2017-01-07 | Disposition: A | Discharge status: Home / Self Care | Payer: Self-pay | Attending: Emergency Medicine | Admitting: Emergency Medicine

## 2017-01-07 ENCOUNTER — Emergency Department (HOSPITAL_COMMUNITY): Payer: Self-pay

## 2017-01-07 DIAGNOSIS — M25561 Pain in right knee: Secondary | ICD-10-CM | POA: Insufficient documentation

## 2017-01-07 DIAGNOSIS — E119 Type 2 diabetes mellitus without complications: Secondary | ICD-10-CM | POA: Insufficient documentation

## 2017-01-07 DIAGNOSIS — Z7984 Long term (current) use of oral hypoglycemic drugs: Secondary | ICD-10-CM | POA: Insufficient documentation

## 2017-01-07 DIAGNOSIS — M546 Pain in thoracic spine: Secondary | ICD-10-CM | POA: Insufficient documentation

## 2017-01-07 DIAGNOSIS — Z79899 Other long term (current) drug therapy: Secondary | ICD-10-CM | POA: Insufficient documentation

## 2017-01-07 HISTORY — DX: Obesity, unspecified: E66.9

## 2017-01-07 LAB — URINALYSIS, ROUTINE W REFLEX MICROSCOPIC
Bilirubin Urine: NEGATIVE
GLUCOSE, UA: 50 mg/dL — AB
HGB URINE DIPSTICK: NEGATIVE
KETONES UR: NEGATIVE mg/dL
Leukocytes, UA: NEGATIVE
Nitrite: NEGATIVE
PROTEIN: NEGATIVE mg/dL
Specific Gravity, Urine: 1.018 (ref 1.005–1.030)
pH: 6 (ref 5.0–8.0)

## 2017-01-07 LAB — POC URINE PREG, ED: Preg Test, Ur: NEGATIVE

## 2017-01-07 LAB — CBG MONITORING, ED: GLUCOSE-CAPILLARY: 199 mg/dL — AB (ref 65–99)

## 2017-01-07 MED ORDER — IBUPROFEN 200 MG PO TABS
600.0000 mg | ORAL_TABLET | Freq: Once | ORAL | Status: AC
  Administered 2017-01-07: 600 mg via ORAL
  Filled 2017-01-07: qty 1

## 2017-01-07 NOTE — ED Triage Notes (Signed)
Pt reports right knee pain for several days. Also states that she has "kidney pain" states she has bilateral back pain but denies any pain with urination or fever. Ambulatory at triage.

## 2017-01-07 NOTE — Discharge Instructions (Signed)
Your knee pain is likely due to a medial meniscus tear. Wear the knee sleeve for support and comfort, use knee exercises provided, using use ibuprofen or Tylenol for pain. Please follow-up with Dr. Jena Gauss with orthopedics. Return to emergency department if you develop worsening swelling, redness, fevers or chills, continue to follow-up with your primary doctor. Your urinalysis looked good, continue to drink lots of water.

## 2017-01-07 NOTE — ED Provider Notes (Signed)
[facility] DEPT Provider Note   CSN: [id number] Arrival date & time: 01/07/17  1111     History   Chief Complaint Chief Complaint  Patient presents with  . Knee Pain  . Back Pain    HPI  Norma Chambers is a 46 y.o. Female with a history of diabetes and obesity, who presents today complaining of 3 days of right knee pain, as well as some pain over her kidneys in her mid back over the past 3 weeks. She reports she was sitting for 5 hours in a class on Friday and when she stood up she had some stiffness and pain in her right knee, she describes the pain as a dull ache that is worse when she walks, she feels like her knee clicks and pops sometimes when walking now. She reports it feels a little bit swollen compared to her left, but she's been able to walk on it without issue, patient has tried some ice packs and heat for the knee with no improvement, has not taken any medications prior to arrival. Denies any redness or heat to the knee, no fevers or chills. No previous injury or surgeries to the knee. No pain at the ankle or hip. Patient also reports some pain in her mid back over both of her kidneys in the morning when she wakes up, she reports this is happened maybe 3 times in the last 2 weeks. She reports that as a dull ache, after she drinks some water gets moving and starts her day and goes to the bathroom the pain subsides and does not return. Patient denies any history of kidney problems. Reports the pain does not radiate anywhere, no pain at midline of spine. Patient denies any urinary symptoms, no dysuria frequency no change in urine color. Patient denies any abdominal pain nausea or vomiting. Patient does have diabetes, currently managed by metformin and Januvia, patient reports she had eaten just prior to arrival, 2 hour postprandial blood sugars usually range in the 80s to 90s.      Past Medical History:  Diagnosis Date  . Gestational diabetes   . Obesity     Patient Active  Problem List   Diagnosis Date Noted  . DM (diabetes mellitus) ([place]) 07/10/2013  . Obesity 08/19/2012    Past Surgical History:  Procedure Laterality Date  . CESAREAN SECTION    . CESAREAN SECTION N/A 11/18/2012   Procedure: REPEAT CESAREAN SECTION;  Surgeon: Mora Bellman, MD;  Location: [facility];  Service: Obstetrics;  Laterality: N/A;    OB History    Gravida Para Term Preterm AB Living   5 3 3     3    SAB TAB Ectopic Multiple Live Births           1       Home Medications    Prior to Admission medications   Medication Sig Start Date End Date Taking? Authorizing Provider  ACCU-CHEK AVIVA PLUS test strip USE AS INSTRUCTED 12/06/16   Tresa Garter, MD  Blood Glucose Monitoring Suppl (ACCU-CHEK AVIVA PLUS) W/DEVICE KIT USE AS INSTRUCTED 01/18/15   Boykin Nearing, MD  Blood Glucose Monitoring Suppl (TRUE METRIX METER) w/Device KIT Use as directed 11/02/16   Boykin Nearing, MD  glipiZIDE (GLIPIZIDE XL) 5 MG 24 hr tablet Take 1 tablet (5 mg total) by mouth daily with breakfast. 03/06/16   Boykin Nearing, MD  Lancet Devices [facility]) lancets Use as instructed 01/18/15   Boykin Nearing, MD  metFORMIN (GLUCOPHAGE)  1000 MG tablet Take 1 tablet (1,000 mg total) by mouth 2 (two) times daily with a meal. 03/06/16   Funches, Josalyn, MD  sitaGLIPtin (JANUVIA) 100 MG tablet Take 1 tablet (100 mg total) by mouth daily. 03/06/16   Boykin Nearing, MD  TRUEPLUS LANCETS 28G MISC Use as directed 11/02/16   Boykin Nearing, MD    Family History Family History  Problem Relation Age of Onset  . Diabetes Mother   . Diabetes Maternal Grandmother     Social History Social History  Substance Use Topics  . Smoking status: Never Smoker  . Smokeless tobacco: Never Used  . Alcohol use No     Allergies   Patient has no known allergies.   Review of Systems Review of Systems  Constitutional: Negative for chills and fever.  HENT: Negative for congestion, rhinorrhea and sore  throat.   Eyes: Negative for visual disturbance.  Respiratory: Negative for cough, chest tightness and shortness of breath.   Cardiovascular: Negative for chest pain and palpitations.  Gastrointestinal: Negative for abdominal pain, nausea and vomiting.  Genitourinary: Positive for flank pain. Negative for difficulty urinating, dysuria, frequency, hematuria, urgency and vaginal discharge.  Musculoskeletal: Positive for arthralgias, back pain and joint swelling.  Skin: Negative for color change, rash and wound.  Neurological: Negative for dizziness, weakness, light-headedness and numbness.     Physical Exam Updated Vital Signs BP 139/70 (BP Location: Right Arm)   Pulse 94   Temp 99.6 F (37.6 C) (Oral)   Resp 18   LMP 12/24/2016   SpO2 98%   Physical Exam  Constitutional: She appears well-developed and well-nourished. No distress.  HENT:  Head: Normocephalic and atraumatic.  Eyes: Right eye exhibits no discharge. Left eye exhibits no discharge.  Cardiovascular: Normal rate, regular rhythm, normal heart sounds and intact distal pulses.   Pulmonary/Chest: Effort normal and breath sounds normal. No respiratory distress. She has no wheezes. She has no rales. She exhibits no tenderness.  Abdominal: Soft. Bowel sounds are normal. She exhibits no distension and no mass. There is no tenderness. There is no guarding.  No CVA tenderness  Musculoskeletal:  Moderate effusion of the right knee, no erythema or warmth, TTP over the medial aspect of the right knee, no obvious deformity palpated, Full ROM with some stiffness, pt able to bear weight, gait minimally affected, + McMurray Test, 2+ DP and PT pulses and good cap refill  Neurological: She is alert. Coordination normal.  Strength 5/5 in bilateral lower extremities with extension and flexion of the knee and dorsi- and plantarflexion, sensation intact  Skin: Skin is warm and dry. Capillary refill takes less than 2 seconds. She is not  diaphoretic.  Psychiatric: She has a normal mood and affect. Her behavior is normal.  Nursing note and vitals reviewed.    ED Treatments / Results  Labs (all labs ordered are listed, but only abnormal results are displayed) Labs Reviewed  URINALYSIS, ROUTINE W REFLEX MICROSCOPIC - Abnormal; Notable for the following:       Result Value   APPearance HAZY (*)    Glucose, UA 50 (*)    All other components within normal limits  CBG MONITORING, ED - Abnormal; Notable for the following:    Glucose-Capillary 199 (*)    All other components within normal limits  POC URINE PREG, ED    EKG  EKG Interpretation None       Radiology Dg Knee Complete 4 Views Right  Result Date: 01/07/2017 CLINICAL DATA:  Pt c/o of generalized knee pain since Friday. Denies injury. Pt states it hurts after sitting for long periods of time. EXAM: RIGHT KNEE - COMPLETE 4+ VIEW COMPARISON:  None. FINDINGS: No fracture or bone lesion. Joint spaces are well maintained. No significant degenerative/arthropathic change. Moderate-sized joint effusion. Surrounding soft tissues are unremarkable. IMPRESSION: 1. No fracture, bone lesion or significant arthropathic change. 2. Moderate nonspecific joint effusion. Electronically Signed   By: Lajean Manes M.D.   On: 01/07/2017 12:13    Procedures Procedures (including critical care time)  Medications Ordered in ED Medications  ibuprofen (ADVIL,MOTRIN) tablet 600 mg (600 mg Oral Given 01/07/17 1209)     Initial Impression / Assessment and Plan / ED Course  I have reviewed the triage vital signs and the nursing notes.  Pertinent labs & imaging results that were available during my care of the patient were reviewed by me and considered in my medical decision making (see chart for details).  Pt presents with right knee pain and intermittent pain over kidneys. Vitals normal and pt well-appearing. No fevers or erythema, no concern for septic joint. Presentation suggest  medial meniscus tear, neurovascularly intact and pt can bear weight, knee sleeve provided for comfort, ibuprofen for pain. Pt to follow up with Dr. Doreatha Martin with Ortho. Unclear etiology of pt's intermittent pain over kidneys, no urinary symptoms, UA w/o abnormalities, no tenderness on exam, no concern for infection, recommend pt follow up with PCP and continue to drink lots of water. Return precautions discussed, Pt expresses understanding and agrees with plan.  Patient discussed with Dr. Ellender Hose, who saw patient as well and agrees with plan.   Final Clinical Impressions(s) / ED Diagnoses   Final diagnoses:  Acute pain of right knee  Acute bilateral thoracic back pain    New Prescriptions Discharge Medication List as of 01/07/2017  1:29 PM       Jacqlyn Larsen, PA-C 01/08/17 1406    Duffy Bruce, MD 01/08/17 2125

## 2017-01-30 MED FILL — ?METFORMIN HCL 1,000 MG TAB: 1000 | 30 days supply | Qty: 60 | Fill #10

## 2017-01-30 MED FILL — glipiZIDE XL 5 MG TB24: 5 | 30 days supply | Qty: 30 | Fill #10

## 2017-03-23 ENCOUNTER — Telehealth: Payer: Self-pay | Admitting: Family Medicine

## 2017-03-23 ENCOUNTER — Other Ambulatory Visit: Payer: Self-pay | Admitting: Family Medicine

## 2017-03-23 DIAGNOSIS — E119 Type 2 diabetes mellitus without complications: Secondary | ICD-10-CM

## 2017-03-23 MED ORDER — GLIPIZIDE ER 5 MG PO TB24: 5.0000 mg | ORAL_TABLET | Freq: Every day | ORAL | 0 refills | Status: DC

## 2017-03-23 MED ORDER — METFORMIN HCL 1000 MG PO TABS: 1000.0000 mg | ORAL_TABLET | Freq: Two times a day (BID) | ORAL | 0 refills | Status: DC

## 2017-03-23 MED FILL — ?METFORMIN HCL 1,000 MG TAB: 1000 | 18 days supply | Qty: 36 | Fill #0

## 2017-03-23 MED FILL — glipiZIDE XL 5 MG TB24: 5 | 18 days supply | Qty: 18 | Fill #0

## 2017-03-23 NOTE — Telephone Encounter (Signed)
Pt came to the office to request a refill for metFORMIN (GLUCOPHAGE) 1000 MG tablet  glipiZIDE (GLIPIZIDE XL) 5 MG 24 hr table Pt  [facility], she already schedule appt with her new pcp on 04/10/17 and she need the med refill, please follow up

## 2017-03-23 NOTE — Telephone Encounter (Signed)
Patient has not been seen in over a year. Will forward to provider who patient is establishing care with.

## 2017-03-23 NOTE — Telephone Encounter (Signed)
Will refill medication until scheduled office  visit. Recommend she follow up with office visit.

## 2017-04-10 ENCOUNTER — Encounter: Payer: Self-pay | Admitting: Family Medicine

## 2017-04-10 ENCOUNTER — Ambulatory Visit: Payer: Self-pay | Attending: Family Medicine | Admitting: Family Medicine

## 2017-04-10 VITALS — BP 128/81 | HR 96 | Temp 98.3°F | Resp 18 | Ht 62.0 in | Wt 290.0 lb

## 2017-04-10 DIAGNOSIS — Z79899 Other long term (current) drug therapy: Secondary | ICD-10-CM | POA: Insufficient documentation

## 2017-04-10 DIAGNOSIS — E119 Type 2 diabetes mellitus without complications: Secondary | ICD-10-CM | POA: Insufficient documentation

## 2017-04-10 DIAGNOSIS — Z7984 Long term (current) use of oral hypoglycemic drugs: Secondary | ICD-10-CM | POA: Insufficient documentation

## 2017-04-10 LAB — GLUCOSE, POCT (MANUAL RESULT ENTRY): POC GLUCOSE: 249 mg/dL — AB (ref 70–99)

## 2017-04-10 LAB — POCT GLYCOSYLATED HEMOGLOBIN (HGB A1C): HEMOGLOBIN A1C: 8.3

## 2017-04-10 MED ORDER — TRUEPLUS LANCETS 28G MISC: 12 refills | Status: DC

## 2017-04-10 MED ORDER — SITAGLIPTIN PHOSPHATE 50 MG PO TABS: 50.0000 mg | ORAL_TABLET | Freq: Every day | ORAL | 2 refills | Status: DC

## 2017-04-10 MED ORDER — GLIPIZIDE ER 5 MG PO TB24: 5.0000 mg | ORAL_TABLET | Freq: Every day | ORAL | 2 refills | Status: DC

## 2017-04-10 MED ORDER — GLUCOSE BLOOD VI STRP: ORAL_STRIP | 12 refills | Status: DC

## 2017-04-10 MED ORDER — METFORMIN HCL 1000 MG PO TABS: 1000.0000 mg | ORAL_TABLET | Freq: Two times a day (BID) | ORAL | 2 refills | Status: DC

## 2017-04-10 MED FILL — TRUEplus LANCETS 28G MISC: 30 days supply | Qty: 100 | Fill #0

## 2017-04-10 MED FILL — JANUVIA 50 MG TABLET: 50 | 30 days supply | Qty: 30 | Fill #0

## 2017-04-10 MED FILL — TRUE METRIX TEST STRIP: 30 days supply | Qty: 100 | Fill #0

## 2017-04-10 NOTE — Progress Notes (Signed)
Subjective:  Patient ID: Norma Chambers, female    DOB: 1971-02-11  Age: 47 y.o. MRN: [id number]  CC: Diabetes and Medication Refill   HPI Norma Chambers presents for diabetes follow up. DM:  Symptoms: hyperglycemia. Patient denies foot ulcerations, paresthesia of the feet and visual disturbances.  Evaluation to date has been included: hemoglobin A1C.  Home sugars: BGs range in  200's after meals. She reports walking daily for exercise and eating a diet lower in carbs . Treatment to date: metformin and glipizide. She denies taking [place]uvia for DM symptoms.   Outpatient Medications Prior to Visit  Medication Sig Dispense Refill  . glipiZIDE (GLIPIZIDE XL) 5 MG 24 hr tablet Take 1 tablet (5 mg total) by mouth daily with breakfast. Last fill. Must have office visit for refills. 18 tablet 0  . metFORMIN (GLUCOPHAGE) 1000 MG tablet Take 1 tablet (1,000 mg total) by mouth 2 (two) times daily with a meal. 36 tablet 0  . sitaGLIPtin (JANUVIA) 100 MG tablet Take 1 tablet (100 mg total) by mouth daily. (Patient not taking: Reported on 01/07/2017) 30 tablet 11  . TRUEPLUS LANCETS 28G MISC Use as directed (Patient not taking: Reported on 01/07/2017) 100 each 12   No facility-administered medications prior to visit.     ROS Review of Systems  Constitutional: Negative.   Eyes: Negative.   Respiratory: Negative.   Cardiovascular: Negative.   Endocrine: Negative.   Skin: Negative.   Psychiatric/Behavioral: Negative.    Objective:  BP 128/81 (BP Location: Left Arm, Patient Position: Sitting, Cuff Size: Large)   Pulse 96   Temp 98.3 F (36.8 C) (Oral)   Resp 18   Ht 5\' 2"  (1.575 m)   Wt 290 lb (131.5 kg)   SpO2 99%   BMI 53.04 kg/m   BP/Weight 04/10/2017 01/07/2017 03/06/2016  Systolic BP 128 139 116  Diastolic BP 81 70 79  Wt. (Lbs) 290 - 293.4  BMI 53.04 - 50.36   Physical Exam  Constitutional: She appears well-developed and well-nourished.  Eyes: Conjunctivae are normal. Pupils are equal,  round, and reactive to light.  Cardiovascular: Normal rate, regular rhythm, normal heart sounds and intact distal pulses.  Pulmonary/Chest: Effort normal and breath sounds normal.  Abdominal: Soft. Bowel sounds are normal.  Skin: Skin is warm and dry.  Psychiatric: She has a normal mood and affect.  Nursing note and vitals reviewed.   Diabetic Foot Exam - Simple   Simple Foot Form Diabetic Foot exam was performed with the following findings:  Yes 04/10/2017  9:19 AM  Visual Inspection No deformities, no ulcerations, no other skin breakdown bilaterally:  Yes Sensation Testing Intact to touch and monofilament testing bilaterally:  Yes Pulse Check Posterior Tibialis and Dorsalis pulse intact bilaterally:  Yes Comments     Assessment & Plan:   1. Type 2 diabetes mellitus without complication, without long-term current use of insulin (HCC) -Start checking CBG BID. -Bring glucometer and/or log to next office visit. - HgB A1c - Glucose (CBG) - glipiZIDE (GLIPIZIDE XL) 5 MG 24 hr tablet; Take 1 tablet (5 mg total) by mouth daily with breakfast. Last fill. Must have office visit for refills.  Dispense: 30 tablet; Refill: 2 - metFORMIN (GLUCOPHAGE) 1000 MG tablet; Take 1 tablet (1,000 mg total) by mouth 2 (two) times daily with a meal.  Dispense: 60 tablet; Refill: 2 - sitaGLIPtin (JANUVIA) 50 MG tablet; Take 1 tablet (50 mg total) by mouth daily.  Dispense: 30 tablet; Refill: 2 - TRUEPLUS  LANCETS 28G MISC; Use as directed  Dispense: 100 each; Refill: 12 - glucose blood test strip; Use as instructed  Dispense: 100 each; Refill: 12 - CMP and Liver - Lipid Panel - Ambulatory referral to Ophthalmology    Follow-up: Return in about 6 weeks (around 05/22/2017) for DM.   Norma Bark FNP

## 2017-04-10 NOTE — Patient Instructions (Addendum)
Start checking blood sugars twice a day bring glucometer or blood sugar log to next office visit.  Diabetes Mellitus and Nutrition When you have diabetes (diabetes mellitus), it is very important to have healthy eating habits because your blood sugar (glucose) levels are greatly affected by what you eat and drink. Eating healthy foods in the appropriate amounts, at about the same times every day, can help you:  Control your blood glucose.  Lower your risk of heart disease.  Improve your blood pressure.  Reach or maintain a healthy weight.  Every person with diabetes is different, and each person has different needs for a meal plan. Your health care provider may recommend that you work with a diet and nutrition specialist (dietitian) to make a meal plan that is best for you. Your meal plan may vary depending on factors such as:  The calories you need.  The medicines you take.  Your weight.  Your blood glucose, blood pressure, and cholesterol levels.  Your activity level.  Other health conditions you have, such as heart or kidney disease.  How do carbohydrates affect me? Carbohydrates affect your blood glucose level more than any other type of food. Eating carbohydrates naturally increases the amount of glucose in your blood. Carbohydrate counting is a method for keeping track of how many carbohydrates you eat. Counting carbohydrates is important to keep your blood glucose at a healthy level, especially if you use insulin or take certain oral diabetes medicines. It is important to know how many carbohydrates you can safely have in each meal. This is different for every person. Your dietitian can help you calculate how many carbohydrates you should have at each meal and for snack. Foods that contain carbohydrates include:  Bread, cereal, rice, pasta, and crackers.  Potatoes and corn.  Peas, beans, and lentils.  Milk and yogurt.  Fruit and juice.  Desserts, such as cakes,  cookies, ice cream, and candy.  How does alcohol affect me? Alcohol can cause a sudden decrease in blood glucose (hypoglycemia), especially if you use insulin or take certain oral diabetes medicines. Hypoglycemia can be a life-threatening condition. Symptoms of hypoglycemia (sleepiness, dizziness, and confusion) are similar to symptoms of having too much alcohol. If your health care provider says that alcohol is safe for you, follow these guidelines:  Limit alcohol intake to no more than 1 drink per day for nonpregnant women and 2 drinks per day for men. One drink equals 12 oz of beer, 5 oz of wine, or 1 oz of hard liquor.  Do not drink on an empty stomach.  Keep yourself hydrated with water, diet soda, or unsweetened iced tea.  Keep in mind that regular soda, juice, and other mixers may contain a lot of sugar and must be counted as carbohydrates.  What are tips for following this plan? Reading food labels  Start by checking the serving size on the label. The amount of calories, carbohydrates, fats, and other nutrients listed on the label are based on one serving of the food. Many foods contain more than one serving per package.  Check the total grams (g) of carbohydrates in one serving. You can calculate the number of servings of carbohydrates in one serving by dividing the total carbohydrates by 15. For example, if a food has 30 g of total carbohydrates, it would be equal to 2 servings of carbohydrates.  Check the number of grams (g) of saturated and trans fats in one serving. Choose foods that have low or no  amount of these fats.  Check the number of milligrams (mg) of sodium in one serving. Most people should limit total sodium intake to less than 2,300 mg per day.  Always check the nutrition information of foods labeled as "low-fat" or "nonfat". These foods may be higher in added sugar or refined carbohydrates and should be avoided.  Talk to your dietitian to identify your daily  goals for nutrients listed on the label. Shopping  Avoid buying canned, premade, or processed foods. These foods tend to be high in fat, sodium, and added sugar.  Shop around the outside edge of the grocery store. This includes fresh fruits and vegetables, bulk grains, fresh meats, and fresh dairy. Cooking  Use low-heat cooking methods, such as baking, instead of high-heat cooking methods like deep frying.  Cook using healthy oils, such as olive, canola, or sunflower oil.  Avoid cooking with butter, cream, or high-fat meats. Meal planning  Eat meals and snacks regularly, preferably at the same times every day. Avoid going long periods of time without eating.  Eat foods high in fiber, such as fresh fruits, vegetables, beans, and whole grains. Talk to your dietitian about how many servings of carbohydrates you can eat at each meal.  Eat 4-6 ounces of lean protein each day, such as lean meat, chicken, fish, eggs, or tofu. 1 ounce is equal to 1 ounce of meat, chicken, or fish, 1 egg, or 1/4 cup of tofu.  Eat some foods each day that contain healthy fats, such as avocado, nuts, seeds, and fish. Lifestyle   Check your blood glucose regularly.  Exercise at least 30 minutes 5 or more days each week, or as told by your health care provider.  Take medicines as told by your health care provider.  Do not use any products that contain nicotine or tobacco, such as cigarettes and e-cigarettes. If you need help quitting, ask your health care provider.  Work with a Veterinary surgeoncounselor or diabetes educator to identify strategies to manage stress and any emotional and social challenges. What are some questions to ask my health care provider?  Do I need to meet with a diabetes educator?  Do I need to meet with a dietitian?  What number can I call if I have questions?  When are the best times to check my blood glucose? Where to find more information:  American Diabetes Association:  diabetes.org/food-and-fitness/food  Academy of Nutrition and Dietetics: https://www.vargas.com/www.eatright.org/resources/health/diseases-and-conditions/diabetes  [employer] Institute of Diabetes and Digestive and Kidney Diseases (NIH): FindJewelers.czwww.niddk.nih.gov/health-information/diabetes/overview/diet-eating-physical-activity Summary  A healthy meal plan will help you control your blood glucose and maintain a healthy lifestyle.  Working with a diet and nutrition specialist (dietitian) can help you make a meal plan that is best for you.  Keep in mind that carbohydrates and alcohol have immediate effects on your blood glucose levels. It is important to count carbohydrates and to use alcohol carefully. This information is not intended to replace advice given to you by your health care provider. Make sure you discuss any questions you have with your health care provider. Document Released: 12/15/2004 Document Revised: 04/24/2016 Document Reviewed: 04/24/2016 Elsevier Interactive Patient Education  [employer]2018 Elsevier Inc.

## 2017-04-11 LAB — LIPID PANEL
Chol/HDL Ratio: 3.6 ratio (ref 0.0–4.4)
Cholesterol, Total: 168 mg/dL (ref 100–199)
HDL: 47 mg/dL (ref >39)
LDL Calculated: 94 mg/dL (ref 0–99)
Triglycerides: 134 mg/dL (ref 0–149)
VLDL CHOLESTEROL CAL: 27 mg/dL (ref 5–40)

## 2017-04-11 LAB — CMP AND LIVER
ALT: 74 IU/L — AB (ref 0–32)
AST: 51 IU/L — ABNORMAL HIGH (ref 0–40)
Albumin: 4.1 g/dL (ref 3.5–5.5)
Alkaline Phosphatase: 76 IU/L (ref 39–117)
BUN: 9 mg/dL (ref 6–24)
Bilirubin, Direct: 0.06 mg/dL (ref 0.00–0.40)
CALCIUM: 9.1 mg/dL (ref 8.7–10.2)
CO2: 20 mmol/L (ref 20–29)
Chloride: 101 mmol/L (ref 96–106)
Creatinine, Ser: 0.44 mg/dL — ABNORMAL LOW (ref 0.57–1.00)
GFR, EST AFRICAN AMERICAN: 142 mL/min/{1.73_m2} (ref >59)
GFR, EST NON AFRICAN AMERICAN: 123 mL/min/{1.73_m2} (ref >59)
GLUCOSE: 201 mg/dL — AB (ref 65–99)
Potassium: 4.6 mmol/L (ref 3.5–5.2)
Sodium: 139 mmol/L (ref 134–144)
Total Protein: 6.9 g/dL (ref 6.0–8.5)

## 2017-04-11 MED FILL — glipiZIDE XL 5 MG TB24: 5 | 30 days supply | Qty: 30 | Fill #0

## 2017-04-11 MED FILL — ?METFORMIN HCL 1,000 MG TAB: 1000 | 30 days supply | Qty: 60 | Fill #0

## 2017-04-13 MED FILL — !TRUE METRIX BLOOD GLUCOSE: 365 days supply | Qty: 1 | Fill #0

## 2017-04-16 ENCOUNTER — Telehealth: Payer: Self-pay | Admitting: *Deleted

## 2017-04-16 NOTE — Telephone Encounter (Signed)
Spoke to patient, verified name and DOB. Pt states she is compliant to diet recommendations already. She does not take tylenol or consume ETOH.    Please advise for further recommendations.

## 2017-04-16 NOTE — Telephone Encounter (Signed)
Lizbeth BarkHairston, Mandesia R, FNP  [place] Christians[place]is[place], Nubia K, [place]MA        Cholesterol levels normal.  Kidney function normal.  Liver enzymes elevated. Start eating a diet lower in saturated fat. Limit your intake of fried foods, red meats, and whole milk. Increase physical activity. Encourage weight loss   Left message on voicemail to return call.

## 2017-04-24 NOTE — Telephone Encounter (Signed)
Encourage weight loss. No further recommendations. Recommend recheck levels again in 3 months.

## 2017-04-25 NOTE — Telephone Encounter (Signed)
Pt aware of message. Aware of appointment.

## 2017-05-07 MED FILL — TRUE METRIX TEST STRIP: 30 days supply | Qty: 100 | Fill #1

## 2017-05-07 MED FILL — glipiZIDE XL 5 MG TB24: 5 | 30 days supply | Qty: 30 | Fill #1

## 2017-05-07 MED FILL — !JANUVIA 50 MG TABLET: 50 | 30 days supply | Qty: 30 | Fill #1

## 2017-05-07 MED FILL — ?METFORMIN HCL 1,000 MG TAB: 1000 | 30 days supply | Qty: 60 | Fill #1

## 2017-05-21 ENCOUNTER — Ambulatory Visit: Payer: Self-pay | Attending: Family Medicine | Admitting: Family Medicine

## 2017-05-21 VITALS — BP 123/85 | HR 97 | Temp 99.4°F | Resp 20 | Wt 286.0 lb

## 2017-05-21 DIAGNOSIS — Z7984 Long term (current) use of oral hypoglycemic drugs: Secondary | ICD-10-CM | POA: Insufficient documentation

## 2017-05-21 DIAGNOSIS — Z20828 Contact with and (suspected) exposure to other viral communicable diseases: Secondary | ICD-10-CM | POA: Insufficient documentation

## 2017-05-21 DIAGNOSIS — J069 Acute upper respiratory infection, unspecified: Secondary | ICD-10-CM | POA: Insufficient documentation

## 2017-05-21 DIAGNOSIS — E119 Type 2 diabetes mellitus without complications: Secondary | ICD-10-CM | POA: Insufficient documentation

## 2017-05-21 LAB — POCT UA - MICROALBUMIN
Albumin/Creatinine Ratio, Urine, POC: <30
Creatinine, POC: 300 mg/dL
Microalbumin Ur, POC: 30 mg/L

## 2017-05-21 LAB — GLUCOSE, POCT (MANUAL RESULT ENTRY): POC Glucose: 203 mg/dl — AB (ref 70–99)

## 2017-05-21 MED ORDER — OSELTAMIVIR PHOSPHATE 75 MG PO CAPS: 75.0000 mg | ORAL_CAPSULE | Freq: Every day | ORAL | 0 refills | Status: DC

## 2017-05-21 MED ORDER — GUAIFENESIN-DM 100-10 MG/5ML PO SYRP: 5.0000 mL | ORAL_SOLUTION | Freq: Three times a day (TID) | ORAL | 0 refills | Status: DC | PRN

## 2017-05-21 MED ORDER — SITAGLIPTIN PHOSPHATE 100 MG PO TABS: 100.0000 mg | ORAL_TABLET | Freq: Every day | ORAL | 2 refills | Status: DC

## 2017-05-21 MED ORDER — BENZONATATE 100 MG PO CAPS: 100.0000 mg | ORAL_CAPSULE | Freq: Two times a day (BID) | ORAL | 0 refills | Status: DC | PRN

## 2017-05-21 MED FILL — **JANUVIA 100 MG TABLET: 100 | 28 days supply | Qty: 28 | Fill #0

## 2017-05-21 MED FILL — OSELTAMIVIR PHOSPHATE 75 MG: 75 | 10 days supply | Qty: 10 | Fill #0

## 2017-05-21 MED FILL — BENZONATATE 100 MG CAPSULE: 100 | 15 days supply | Qty: 30 | Fill #0

## 2017-05-21 MED FILL — ROBAFEN-DM SYRUP: 100-10 | 15 days supply | Qty: 236 | Fill #0

## 2017-05-21 NOTE — Progress Notes (Signed)
Fever  And Cough x 1 week

## 2017-05-21 NOTE — Progress Notes (Signed)
Subjective:  Patient ID: Norma Chambers, female    DOB: May 08, 1970  Age: 47 y.o. MRN: [id number]  CC: URI and Diabetes   HPI Norma Chambers presents for follow up for complains of symptoms of a URI. Symptoms include cough and fever.  Associated symptoms include chills, fatigue, and body aches.  She reports history of recent flu exposure-daughter tested positive for flu.  She reports taking over-the-counter cold medications for symptoms with minimal relief.  Diabetes  Disease Monitoring  Blood Sugar Ranges: She brings blood glucose log with her to office visit.  CBG's 89- 140's breakfast and lunch CBG ranging 160's-180's.  However she reports fasting after 5 pm each day.  Evening CBG's 180's.  Polyuria: no   Visual problems: no   Medication Compliance: yes  Medication Side Effects  Hypoglycemia: no   Preventative Health Care  Eye Exam: no.  History of ophthalmology referrals, however patient has not yet followed up.  Foot Exam: yes  Diet pattern: carb mod.  Exercise: no          Outpatient Medications Prior to Visit  Medication Sig Dispense Refill  . glipiZIDE (GLIPIZIDE XL) 5 MG 24 hr tablet Take 1 tablet (5 mg total) by mouth daily with breakfast. Last fill. Must have office visit for refills. 30 tablet 2  . metFORMIN (GLUCOPHAGE) 1000 MG tablet Take 1 tablet (1,000 mg total) by mouth 2 (two) times daily with a meal. 60 tablet 2  . sitaGLIPtin (JANUVIA) 50 MG tablet Take 1 tablet (50 mg total) by mouth daily. 30 tablet 2  . glucose blood test strip Use as instructed 100 each 12  . TRUEPLUS LANCETS 28G MISC Use as directed 100 each 12   No facility-administered medications prior to visit.     ROS Review of Systems  Constitutional: Positive for chills, fatigue and fever.  HENT: Negative.   Eyes: Negative.   Respiratory: Positive for cough.   Cardiovascular: Negative.   Gastrointestinal: Negative.   Musculoskeletal: Positive for myalgias.  Neurological: Negative.     Objective:  BP 123/85 (BP Location: Left Arm, Patient Position: Sitting, Cuff Size: Large)   Pulse 97   Temp 99.4 F (37.4 C) (Oral)   Resp 20   Wt 286 lb (129.7 kg)   SpO2 96%   BMI 52.31 kg/m   BP/Weight 05/21/2017 04/10/2017 01/07/2017  Systolic BP 123 128 139  Diastolic BP 85 81 70  Wt. (Lbs) 286 290 -  BMI 52.31 53.04 -   Physical Exam  Constitutional: She is oriented to person, place, and time. She has a sickly appearance.  HENT:  Head: Normocephalic and atraumatic.  Right Ear: External ear normal.  Left Ear: External ear normal.  Nose: Rhinorrhea present.  Mouth/Throat: Oropharynx is clear and moist.  Eyes: Conjunctivae are normal.  Neck: Normal range of motion. Neck supple.  Cardiovascular: Normal rate, regular rhythm, normal heart sounds and intact distal pulses.  Pulmonary/Chest: Effort normal and breath sounds normal.  Abdominal: Soft. Bowel sounds are normal.  Lymphadenopathy:    She has no cervical adenopathy.  Neurological: She is alert and oriented to person, place, and time.  Skin: Skin is warm and dry.  Psychiatric: She has a normal mood and affect.  Nursing note and vitals reviewed.  Assessment & Plan:   1. Upper respiratory tract infection, unspecified type  - benzonatate (TESSALON) 100 MG capsule; Take 1 capsule (100 mg total) by mouth 2 (two) times daily as needed (severe cough).  Dispense: 30 capsule;  Refill: 0 - guaiFENesin-dextromethorphan (ROBITUSSIN DM) 100-10 MG/5ML syrup; Take 5 mLs by mouth every 8 (eight) hours as needed for cough.  Dispense: 236 mL; Refill: 0  2. Type 2 diabetes mellitus without complication, without long-term current use of insulin (HCC) Inc. Januvia for better glycemic control. - Glucose (CBG) - Respiratory virus panel - sitaGLIPtin (JANUVIA) 100 MG tablet; Take 1 tablet (100 mg total) by mouth daily.  Dispense: 30 tablet; Refill: 2 - POCT UA - Microalbumin  3. Exposure to the flu  - oseltamivir (TAMIFLU) 75 MG  capsule; Take 1 capsule (75 mg total) by mouth daily.  Dispense: 10 capsule; Refill: 0     Follow-up: Return in about 1 month (around 06/18/2017), or if symptoms worsen or fail to improve, for Diabetes .   Lizbeth BarkMandesia R Ayshia Gramlich FNP

## 2017-05-21 NOTE — Patient Instructions (Signed)
Upper Respiratory Infection, Adult Most upper respiratory infections (URIs) are caused by a virus. A URI affects the nose, throat, and upper air passages. The most common type of URI is often called "the common cold." Follow these instructions at home:  Take medicines only as told by your doctor.  Gargle warm saltwater or take cough drops to comfort your throat as told by your doctor.  Use a warm mist humidifier or inhale steam from a shower to increase air moisture. This may make it easier to breathe.  Drink enough fluid to keep your pee (urine) clear or pale yellow.  Eat soups and other clear broths.  Have a healthy diet.  Rest as needed.  Go back to work when your fever is gone or your doctor says it is okay. ? You may need to stay home longer to avoid giving your URI to others. ? You can also wear a face mask and wash your hands often to prevent spread of the virus.  Use your inhaler more if you have asthma.  Do not use any tobacco products, including cigarettes, chewing tobacco, or electronic cigarettes. If you need help quitting, ask your doctor. Contact a doctor if:  You are getting worse, not better.  Your symptoms are not helped by medicine.  You have chills.  You are getting more short of breath.  You have brown or red mucus.  You have yellow or brown discharge from your nose.  You have pain in your face, especially when you bend forward.  You have a fever.  You have puffy (swollen) neck glands.  You have pain while swallowing.  You have white areas in the back of your throat. Get help right away if:  You have very bad or constant: ? Headache. ? Ear pain. ? Pain in your forehead, behind your eyes, and over your cheekbones (sinus pain). ? Chest pain.  You have long-lasting (chronic) lung disease and any of the following: ? Wheezing. ? Long-lasting cough. ? Coughing up blood. ? A change in your usual mucus.  You have a stiff neck.  You have  changes in your: ? Vision. ? Hearing. ? Thinking. ? Mood. This information is not intended to replace advice given to you by your health care provider. Make sure you discuss any questions you have with your health care provider. Document Released: 09/06/2007 Document Revised: 11/21/2015 Document Reviewed: 06/25/2013 Elsevier Interactive Patient Education  2018 [employer] Inc[employer]  

## 2017-05-25 ENCOUNTER — Ambulatory Visit: Payer: Self-pay | Admitting: Family Medicine

## 2017-05-25 ENCOUNTER — Telehealth: Payer: Self-pay | Admitting: Family Medicine

## 2017-05-25 LAB — RESPIRATORY VIRUS PANEL
Adenovirus: NEGATIVE
INFLUENZA B 1: NEGATIVE
Influenza A: POSITIVE — AB
Metapneumovirus: NEGATIVE
PARAINFLUENZA 1 A: NEGATIVE
PARAINFLUENZA 2 A: NEGATIVE
Parainfluenza 3: NEGATIVE
RESPIRATORY SYNCYTIAL VIRUS A: NEGATIVE
RESPIRATORY SYNCYTIAL VIRUS B: NEGATIVE
Rhinovirus: NEGATIVE

## 2017-05-25 NOTE — Telephone Encounter (Signed)
Patient called and contacted regarding updated lab results- positive Influenza A and recommendations.

## 2017-06-13 MED FILL — metFORMIN HCL 1000 MG TABS: 1000 | 30 days supply | Qty: 60 | Fill #2

## 2017-06-13 MED FILL — TRUE METRIX TEST STRIP: 30 days supply | Qty: 100 | Fill #2

## 2017-06-13 MED FILL — glipiZIDE XL 5 MG TB24: 5 | 30 days supply | Qty: 30 | Fill #2

## 2017-06-13 MED FILL — JANUVIA 100 MG TABLET: 100 | 30 days supply | Qty: 30 | Fill #1

## 2017-06-19 ENCOUNTER — Ambulatory Visit: Payer: Medicaid Other | Admitting: Nurse Practitioner

## 2017-06-25 ENCOUNTER — Ambulatory Visit: Payer: Medicaid Other | Attending: Family Medicine

## 2017-07-16 MED FILL — TRUEplus LANCETS 28G MISC: 30 days supply | Qty: 100 | Fill #1

## 2017-07-16 MED FILL — JANUVIA 100 MG TABLET: 100 | 30 days supply | Qty: 30 | Fill #2

## 2017-07-19 ENCOUNTER — Other Ambulatory Visit: Payer: Self-pay | Admitting: Pharmacist

## 2017-07-19 DIAGNOSIS — E119 Type 2 diabetes mellitus without complications: Secondary | ICD-10-CM

## 2017-07-19 MED ORDER — GLIPIZIDE ER 5 MG PO TB24: 5.0000 mg | ORAL_TABLET | Freq: Every day | ORAL | 0 refills | Status: DC

## 2017-07-19 MED ORDER — METFORMIN HCL 1000 MG PO TABS: 1000.0000 mg | ORAL_TABLET | Freq: Two times a day (BID) | ORAL | 0 refills | Status: DC

## 2017-07-19 MED FILL — glipiZIDE XL 5 MG TB24: 5 | 30 days supply | Qty: 30 | Fill #0

## 2017-07-19 MED FILL — ?METFORMIN HCL 1,000 MG TAB: 1000 | 30 days supply | Qty: 60 | Fill #0

## 2017-08-01 ENCOUNTER — Ambulatory Visit: Payer: Self-pay | Attending: Nurse Practitioner | Admitting: Nurse Practitioner

## 2017-08-01 ENCOUNTER — Encounter: Payer: Self-pay | Admitting: Nurse Practitioner

## 2017-08-01 VITALS — BP 120/83 | HR 67 | Temp 99.2°F | Ht 62.0 in | Wt 290.0 lb

## 2017-08-01 DIAGNOSIS — E119 Type 2 diabetes mellitus without complications: Secondary | ICD-10-CM | POA: Insufficient documentation

## 2017-08-01 DIAGNOSIS — Z7984 Long term (current) use of oral hypoglycemic drugs: Secondary | ICD-10-CM | POA: Insufficient documentation

## 2017-08-01 DIAGNOSIS — Z79899 Other long term (current) drug therapy: Secondary | ICD-10-CM | POA: Insufficient documentation

## 2017-08-01 DIAGNOSIS — Z6841 Body Mass Index (BMI) 40.0 and over, adult: Secondary | ICD-10-CM

## 2017-08-01 DIAGNOSIS — Z9889 Other specified postprocedural states: Secondary | ICD-10-CM | POA: Insufficient documentation

## 2017-08-01 DIAGNOSIS — Z833 Family history of diabetes mellitus: Secondary | ICD-10-CM | POA: Insufficient documentation

## 2017-08-01 LAB — GLUCOSE, POCT (MANUAL RESULT ENTRY): POC GLUCOSE: 124 mg/dL — AB (ref 70–99)

## 2017-08-01 LAB — POCT GLYCOSYLATED HEMOGLOBIN (HGB A1C): Hemoglobin A1C: 7.2

## 2017-08-01 MED ORDER — SITAGLIPTIN PHOSPHATE 100 MG PO TABS: 100.0000 mg | ORAL_TABLET | Freq: Every day | ORAL | 1 refills | Status: DC

## 2017-08-01 MED ORDER — GLIPIZIDE ER 5 MG PO TB24: 5.0000 mg | ORAL_TABLET | Freq: Every day | ORAL | 1 refills | Status: DC

## 2017-08-01 MED ORDER — METFORMIN HCL 1000 MG PO TABS: 1000.0000 mg | ORAL_TABLET | Freq: Two times a day (BID) | ORAL | 3 refills | Status: DC

## 2017-08-01 NOTE — Patient Instructions (Addendum)
Obesity, Adult Obesity is having too much body fat. If you have a BMI of 30 or more, you are obese. BMI is a number that explains how much body fat you have. Obesity is often caused by taking in (consuming) more calories than your body uses. Obesity can cause serious health problems. Changing your lifestyle can help to treat obesity. Follow these instructions at home: Eating and drinking   Follow advice from your doctor about what to eat and drink. Your doctor may tell you to: ? Cut down on (limit) fast foods, sweets, and processed snack foods. ? Choose low-fat options. For example, choose low-fat milk instead of whole milk. ? Eat 5 or more servings of fruits or vegetables every day. ? Eat at home more often. This gives you more control over what you eat. ? Choose healthy foods when you eat out. ? Learn what a healthy portion size is. A portion size is the amount of a certain food that is healthy for you to eat at one time. This is different for each person. ? Keep low-fat snacks available. ? Avoid sugary drinks. These include soda, fruit juice, iced tea that is sweetened with sugar, and flavored milk. ? Eat a healthy breakfast.  Drink enough water to keep your pee (urine) clear or pale yellow.  Do not go without eating for long periods of time (do not fast).  Do not go on popular or trendy diets (fad diets). Physical Activity  Exercise often, as told by your doctor. Ask your doctor: ? What types of exercise are safe for you. ? How often you should exercise.  Warm up and stretch before being active.  Do slow stretching after being active (cool down).  Rest between times of being active. Lifestyle  Limit how much time you spend in front of your TV, computer, or video game system (be less sedentary).  Find ways to reward yourself that do not involve food.  Limit alcohol intake to no more than 1 drink a day for nonpregnant women and 2 drinks a day for men. One drink equals 12 oz  of beer, 5 oz of wine, or 1 oz of hard liquor. General instructions  Keep a weight loss journal. This can help you keep track of: ? The food that you eat. ? The exercise that you do.  Take over-the-counter and prescription medicines only as told by your doctor.  Take vitamins and supplements only as told by your doctor.  Think about joining a support group. Your doctor may be able to help with this.  Keep all follow-up visits as told by your doctor. This is important. Contact a doctor if:  You cannot meet your weight loss goal after you have changed your diet and lifestyle for 6 weeks. This information is not intended to replace advice given to you by your health care provider. Make sure you discuss any questions you have with your health care provider. Document Released: 06/12/2011 Document Revised: 08/26/2015 Document Reviewed: 01/06/2015 Elsevier Interactive Patient Education  2018 [employer].  Preventing Unhealthy [employer], Adult Staying at a healthy weight is important. When fat builds up in your body, you may become overweight or obese. These conditions put you at greater risk for developing certain health problems, such as heart disease, diabetes, sleeping problems, joint problems, and some cancers. Unhealthy weight gain is often the result of making unhealthy choices in what you eat. It is also a result of not getting enough exercise. You can make changes  to your lifestyle to prevent obesity and stay as healthy as possible. What nutrition changes can be made? To maintain a healthy weight and prevent obesity:  Eat only as much as your body needs. To do this: ? Pay attention to signs that you are hungry or full. Stop eating as soon as you feel full. ? If you feel hungry, try drinking water first. Drink enough water so your urine is clear or pale yellow. ? Eat smaller portions. ? Look at serving sizes on food labels. Most foods contain more than one serving per  container. ? Eat the recommended amount of calories for your gender and activity level. While most active people should eat around 2,000 calories per day, if you are trying to lose weight or are not very active, you main need to eat less calories. Talk to your health care provider or dietitian about how many calories you should eat each day.  Choose healthy foods, such as: ? Fruits and vegetables. Try to fill at least half of your plate at each meal with fruits and vegetables. ? Whole grains, such as whole wheat bread, brown rice, and quinoa. ? Lean meats, such as chicken or fish. ? Other healthy proteins, such as beans, eggs, or tofu. ? Healthy fats, such as nuts, seeds, fatty fish, and olive oil. ? Low-fat or fat-free dairy.  Check food labels and avoid food and drinks that: ? Are high in calories. ? Have added sugar. ? Are high in sodium. ? Have saturated fats or trans fats.  Limit how much you eat of the following foods: ? Prepackaged meals. ? Fast food. ? Fried foods. ? Processed meat, such as bacon, sausage, and deli meats. ? Fatty cuts of red meat and poultry with skin.  Cook foods in healthier ways, such as by baking, broiling, or grilling.  When grocery shopping, try to shop around the outside of the store. This helps you buy mostly fresh foods and avoid canned and prepackaged foods.  What lifestyle changes can be made?  Exercise at least 30 minutes 5 or more days each week. Exercising includes brisk walking, yard work, biking, running, swimming, and team sports like basketball and soccer. Ask your health care provider which exercises are safe for you.  Do not use any products that contain nicotine or tobacco, such as cigarettes and e-cigarettes. If you need help quitting, ask your health care provider.  Limit alcohol intake to no more than 1 drink a day for nonpregnant women and 2 drinks a day for men. One drink equals 12 oz of beer, 5 oz of wine, or 1 oz of hard  liquor.  Try to get 7-9 hours of sleep each night. What other changes can be made?  Keep a food and activity journal to keep track of: ? What you ate and how many calories you had. Remember to count sauces, dressings, and side dishes. ? Whether you were active, and what exercises you did. ? Your calorie, weight, and activity goals.  Check your weight regularly. Track any changes. If you notice you have gained weight, make changes to your diet or activity routine.  Avoid taking weight-loss medicines or supplements. Talk to your health care provider before starting any new medicine or supplement.  Talk to your health care provider before trying any new diet or exercise plan. Why are these changes important? Eating healthy, staying active, and having healthy habits not only help prevent obesity, they also:  Help you to manage stress and  emotions.  Help you to connect with friends and family.  Improve your self-esteem.  Improve your sleep.  Prevent long-term health problems.  What can happen if changes are not made? Being obese or overweight can cause you to develop joint or bone problems, which can make it hard for you to stay active or do activities you enjoy. Being obese or overweight also puts stress on your heart and lungs and can lead to health problems like diabetes, heart disease, and some cancers. Where to find more information: Talk with your health care provider or a dietitian about healthy eating and healthy lifestyle choices. You may also find other information through these resources:  [place] Department of Agriculture MyPlate: https://ball-collins.biz/  American Heart Association: www.heart.org  Centers for Disease Control and Prevention: FootballExhibition.com.br  Summary  Staying at a healthy weight is important. It helps prevent certain diseases and health problems, such as heart disease, diabetes, joint problems, sleep disorders, and some cancers.  Being obese or overweight can  cause you to develop joint or bone problems, which can make it hard for you to stay active or do activities you enjoy.  You can prevent unhealthy weight gain by eating a healthy diet, exercising regularly, not smoking, limiting alcohol, and getting enough sleep.  Talk with your health care provider or a dietitian for guidance about healthy eating and healthy lifestyle choices. This information is not intended to replace advice given to you by your health care provider. Make sure you discuss any questions you have with your health care provider. Document Released: 03/21/2016 Document Revised: 04/26/2016 Document Reviewed: 04/26/2016 Elsevier Interactive Patient Education  2018 [employer].  Carbohydrate Counting for Diabetes Mellitus, Adult Carbohydrate counting is a method for keeping track of how many carbohydrates you eat. Eating carbohydrates naturally increases the amount of sugar (glucose) in the blood. Counting how many carbohydrates you eat helps keep your blood glucose within normal limits, which helps you manage your diabetes (diabetes mellitus). It is important to know how many carbohydrates you can safely have in each meal. This is different for every person. A diet and nutrition specialist (registered dietitian) can help you make a meal plan and calculate how many carbohydrates you should have at each meal and snack. Carbohydrates are found in the following foods:  Grains, such as breads and cereals.  Dried beans and soy products.  Starchy vegetables, such as potatoes, peas, and corn.  Fruit and fruit juices.  Milk and yogurt.  Sweets and snack foods, such as cake, cookies, candy, chips, and soft drinks.  How do I count carbohydrates? There are two ways to count carbohydrates in food. You can use either of the methods or a combination of both. Reading "Nutrition Facts" on packaged food The "Nutrition Facts" list is included on the labels of almost all packaged foods and  beverages in the [place] It includes:  The serving size.  Information about nutrients in each serving, including the grams (g) of carbohydrate per serving.  To use the "Nutrition Facts":  Decide how many servings you will have.  Multiply the number of servings by the number of carbohydrates per serving.  The resulting number is the total amount of carbohydrates that you will be having.  Learning standard serving sizes of other foods When you eat foods containing carbohydrates that are not packaged or do not include "Nutrition Facts" on the label, you need to measure the servings in order to count the amount of carbohydrates:  Measure the foods that you  will eat with a food scale or measuring cup, if needed.  Decide how many standard-size servings you will eat.  Multiply the number of servings by 15. Most carbohydrate-rich foods have about 15 g of carbohydrates per serving. ? For example, if you eat 8 oz (170 g) of strawberries, you will have eaten 2 servings and 30 g of carbohydrates (2 servings x 15 g = 30 g).  For foods that have more than one food mixed, such as soups and casseroles, you must count the carbohydrates in each food that is included.  The following list contains standard serving sizes of common carbohydrate-rich foods. Each of these servings has about 15 g of carbohydrates:   hamburger bun or  English muffin.   oz (15 mL) syrup.   oz (14 g) jelly.  1 slice of bread.  1 six-inch tortilla.  3 oz (85 g) cooked rice or pasta.  4 oz (113 g) cooked dried beans.  4 oz (113 g) starchy vegetable, such as peas, corn, or potatoes.  4 oz (113 g) hot cereal.  4 oz (113 g) mashed potatoes or  of a large baked potato.  4 oz (113 g) canned or frozen fruit.  4 oz (120 mL) fruit juice.  4-6 crackers.  6 chicken nuggets.  6 oz (170 g) unsweetened dry cereal.  6 oz (170 g) plain fat-free yogurt or yogurt sweetened with artificial sweeteners.  8 oz (240 mL)  milk.  8 oz (170 g) fresh fruit or one small piece of fruit.  24 oz (680 g) popped popcorn.  Example of carbohydrate counting Sample meal  3 oz (85 g) chicken breast.  6 oz (170 g) brown rice.  4 oz (113 g) corn.  8 oz (240 mL) milk.  8 oz (170 g) strawberries with sugar-free whipped topping. Carbohydrate calculation 1. Identify the foods that contain carbohydrates: ? Rice. ? Corn. ? Milk. ? Strawberries. 2. Calculate how many servings you have of each food: ? 2 servings rice. ? 1 serving corn. ? 1 serving milk. ? 1 serving strawberries. 3. Multiply each number of servings by 15 g: ? 2 servings rice x 15 g = 30 g. ? 1 serving corn x 15 g = 15 g. ? 1 serving milk x 15 g = 15 g. ? 1 serving strawberries x 15 g = 15 g. 4. Add together all of the amounts to find the total grams of carbohydrates eaten: ? 30 g + 15 g + 15 g + 15 g = 75 g of carbohydrates total. This information is not intended to replace advice given to you by your health care provider. Make sure you discuss any questions you have with your health care provider. Document Released: 03/20/2005 Document Revised: 10/08/2015 Document Reviewed: 09/01/2015 Elsevier Interactive Patient Education  [employer].

## 2017-08-01 NOTE — Progress Notes (Signed)
Assessment & Plan:  Norma Chambers was seen today for establish care and heart problem.  Diagnoses and all orders for this visit:  Type 2 diabetes mellitus without complication, without long-term current use of insulin (HCC) -     Glucose (CBG) -     HgB A1c -     glipiZIDE (GLIPIZIDE XL) 5 MG 24 hr tablet; Take 1 tablet (5 mg total) by mouth daily with breakfast. Last fill. Must have office visit for refills. -     metFORMIN (GLUCOPHAGE) 1000 MG tablet; Take 1 tablet (1,000 mg total) by mouth 2 (two) times daily with a meal. -     sitaGLIPtin (JANUVIA) 100 MG tablet; Take 1 tablet (100 mg total) by mouth daily. -     Thyroid Panel With TSH  Continue blood sugar control as discussed in office today, low carbohydrate diet, and regular physical exercise as tolerated, 150 minutes per week (30 min each day, 5 days per week, or 50 min 3 days per week). Keep blood sugar logs with fasting goal of 80-130 mg/dl, post prandial less than 180.  For Hypoglycemia: BS <60 and Hyperglycemia BS >400; contact the clinic ASAP. Annual eye exams and foot exams are recommended.   Morbid obesity with BMI of 50.0-59.9, adult (HCC) -     Thyroid Panel With TSH Discussed diet and exercise for person with BMI >25. Instructed: You must burn more calories than you eat. Losing 5 percent of your body weight should be considered a success. In the longer term, losing more than 15 percent of your body weight and staying at this weight is an extremely good result. However, keep in mind that even losing 5 percent of your body weight leads to important health benefits, so try not to get discouraged if you're not able to lose more than this. Will recheck weight in 3-6 months.  Patient has been counseled on age-appropriate routine health concerns for screening and prevention. These are reviewed and up-to-date. Referrals have been placed accordingly. Immunizations are up-to-date or declined.    Subjective:   Chief Complaint    Patient presents with  . Establish Care    Pt. is here to establish care for diabetes.   Marland Kitchen Heart Problem    Pt. stated her heart sometimes feel like it squeezing.    HPI Norma Chambers 47 y.o. female presents to office today to establish care. She has a history of Diabetes Mellitus Type 2.   Diabetes Mellitus Type 2 Chronic. She checks her blood sugar 2-3 times per day: 120-130; Post prandial <180. A1c has improved from 8.3 to 7.2. She denies any hypo or hypoglycemic symptoms. She endorses medication compliance taking Glipizide  daily, Januvia  daily and Metformin  BID. She denies any hypo or hyperglycemic symptoms.  Lab Results  Component Value Date   HGBA1C 7.2 08/01/2017    Review of Systems  Constitutional: Negative for fever, malaise/fatigue and weight loss.  HENT: Negative.  Negative for nosebleeds.   Eyes: Negative.  Negative for blurred vision, double vision and photophobia.  Respiratory: Negative.  Negative for cough and shortness of breath.   Cardiovascular: Negative.  Negative for chest pain, palpitations and leg swelling.  Gastrointestinal: Negative.  Negative for heartburn, nausea and vomiting.  Musculoskeletal: Negative.  Negative for myalgias.  Neurological: Negative.  Negative for dizziness, focal weakness, seizures and headaches.  Psychiatric/Behavioral: Negative.  Negative for suicidal ideas.    Past Medical History:  Diagnosis Date  . Gestational diabetes   .  Obesity     Past Surgical History:  Procedure Laterality Date  . CESAREAN SECTION    . CESAREAN SECTION N/A 11/18/2012   Procedure: REPEAT CESAREAN SECTION;  Surgeon: Catalina Antigua, MD;  Location: [facility];  Service: Obstetrics;  Laterality: N/A;    Family History  Problem Relation Age of Onset  . Diabetes Mother   . Diabetes Maternal Grandmother     Social History Reviewed with no changes to be made today.   Outpatient Medications Prior to Visit  Medication Sig Dispense Refill  .  glucose blood test strip Use as instructed 100 each 12  . TRUEPLUS LANCETS 28G MISC Use as directed 100 each 12  . glipiZIDE (GLIPIZIDE XL) 5 MG 24 hr tablet Take 1 tablet (5 mg total) by mouth daily with breakfast. Last fill. Must have office visit for refills. 30 tablet 0  . metFORMIN (GLUCOPHAGE) 1000 MG tablet Take 1 tablet (1,000 mg total) by mouth 2 (two) times daily with a meal. 60 tablet 0  . sitaGLIPtin (JANUVIA) 100 MG tablet Take 1 tablet (100 mg total) by mouth daily. 30 tablet 2  . benzonatate (TESSALON) 100 MG capsule Take 1 capsule (100 mg total) by mouth 2 (two) times daily as needed (severe cough). (Patient not taking: Reported on 08/01/2017) 30 capsule 0  . guaiFENesin-dextromethorphan (ROBITUSSIN DM) 100-10 MG/5ML syrup Take 5 mLs by mouth every 8 (eight) hours as needed for cough. (Patient not taking: Reported on 08/01/2017) 236 mL 0  . oseltamivir (TAMIFLU) 75 MG capsule Take 1 capsule (75 mg total) by mouth daily. (Patient not taking: Reported on 08/01/2017) 10 capsule 0   No facility-administered medications prior to visit.     No Known Allergies     Objective:    BP 120/83 (BP Location: Left Arm, Patient Position: Sitting, Cuff Size: Large)   Pulse 67   Temp 99.2 F (37.3 C) (Oral)   Ht  (1.575 m)   Wt 290 lb (131.5 kg)   SpO2 95%   BMI 53.04 kg/m  Wt Readings from Last 3 Encounters:  08/01/17 290 lb (131.5 kg)  05/21/17 286 lb (129.7 kg)  04/10/17 290 lb (131.5 kg)    Physical Exam  Constitutional: She is oriented to person, place, and time. She appears well-developed and well-nourished. She is cooperative.  HENT:  Head: Normocephalic and atraumatic.  Eyes: EOM are normal.  Neck: Normal range of motion.  Cardiovascular: Normal rate, regular rhythm and normal heart sounds. Exam reveals no gallop and no friction rub.  No murmur heard. Pulmonary/Chest: Effort normal and breath sounds normal. No tachypnea. No respiratory distress. She has no decreased  breath sounds. She has no wheezes. She has no rhonchi. She has no rales. She exhibits no tenderness.  Abdominal: Soft. Bowel sounds are normal.  Musculoskeletal: Normal range of motion. She exhibits no edema.  Neurological: She is alert and oriented to person, place, and time. Coordination normal.  Skin: Skin is warm and dry.  Psychiatric: She has a normal mood and affect. Her behavior is normal. Judgment and thought content normal.  Nursing note and vitals reviewed.      Patient has been counseled extensively about nutrition and exercise as well as the importance of adherence with medications and regular follow-up. The patient was given clear instructions to go to ER or return to medical center if symptoms don't improve, worsen or new problems develop. The patient verbalized understanding.   Follow-up: Return for Needs 3 month f/u for DM  and also needs an appointment sooner than 3 months for PAP .   Claiborne Rigg, FNP-BC [facility] [place], [place] [phone]   08/01/2017, 1:34 PM

## 2017-08-02 LAB — THYROID PANEL WITH TSH
FREE THYROXINE INDEX: 1.7 (ref 1.2–4.9)
T3 Uptake Ratio: 21 % — ABNORMAL LOW (ref 24–39)
T4 TOTAL: 7.9 ug/dL (ref 4.5–12.0)
TSH: 1.78 u[IU]/mL (ref 0.450–4.500)

## 2017-08-06 ENCOUNTER — Telehealth: Payer: Self-pay

## 2017-08-06 NOTE — Telephone Encounter (Signed)
CMA called patient to inform on lab results.  Patient understood.  

## 2017-08-06 NOTE — Telephone Encounter (Signed)
-----   Message from Claiborne Rigg, NP sent at 08/04/2017 12:47 AM EDT ----- Thyroid panel is essential normal at this time.

## 2017-08-20 MED FILL — !JANUVIA 100MG TABLET: 100 | 30 days supply | Qty: 30 | Fill #0

## 2017-08-20 MED FILL — glipiZIDE XL 5 MG TB24: 5 | 30 days supply | Qty: 30 | Fill #0

## 2017-08-20 MED FILL — ?METFORMIN HCL 1,000 MG TAB: 1000 | 30 days supply | Qty: 60 | Fill #0

## 2017-09-06 MED FILL — TRUE METRIX TEST STRIP: 30 days supply | Qty: 100 | Fill #3

## 2017-09-12 ENCOUNTER — Ambulatory Visit: Payer: Medicaid Other | Admitting: Nurse Practitioner

## 2017-09-19 MED FILL — !JANUVIA 100MG TABLET: 100 | 30 days supply | Qty: 30 | Fill #1

## 2017-09-19 MED FILL — glipiZIDE XL 5 MG TB24: 5 | 30 days supply | Qty: 30 | Fill #1

## 2017-09-19 MED FILL — ?METFORMIN HCL 1,000 MG TAB: 1000 | 30 days supply | Qty: 60 | Fill #1

## 2017-10-22 MED FILL — TRUE METRIX TEST STRIP: 30 days supply | Qty: 100 | Fill #4

## 2017-10-22 MED FILL — ?METFORMIN HCL 1,000 MG TAB: 1000 | 30 days supply | Qty: 60 | Fill #2

## 2017-10-22 MED FILL — glipiZIDE XL 5 MG TB24: 5 | 30 days supply | Qty: 30 | Fill #2

## 2017-10-22 MED FILL — !JANUVIA 100MG TABLET: 100 | 30 days supply | Qty: 30 | Fill #2

## 2017-11-02 ENCOUNTER — Encounter: Payer: Self-pay | Admitting: Nurse Practitioner

## 2017-11-02 ENCOUNTER — Ambulatory Visit: Payer: Self-pay | Attending: Nurse Practitioner | Admitting: Nurse Practitioner

## 2017-11-02 VITALS — BP 128/85 | HR 93 | Temp 97.9°F | Ht 62.0 in | Wt 287.2 lb

## 2017-11-02 DIAGNOSIS — E119 Type 2 diabetes mellitus without complications: Secondary | ICD-10-CM

## 2017-11-02 DIAGNOSIS — Z6841 Body Mass Index (BMI) 40.0 and over, adult: Secondary | ICD-10-CM | POA: Insufficient documentation

## 2017-11-02 DIAGNOSIS — E669 Obesity, unspecified: Secondary | ICD-10-CM | POA: Insufficient documentation

## 2017-11-02 DIAGNOSIS — Z833 Family history of diabetes mellitus: Secondary | ICD-10-CM | POA: Insufficient documentation

## 2017-11-02 DIAGNOSIS — Z79899 Other long term (current) drug therapy: Secondary | ICD-10-CM | POA: Insufficient documentation

## 2017-11-02 DIAGNOSIS — Z7984 Long term (current) use of oral hypoglycemic drugs: Secondary | ICD-10-CM | POA: Insufficient documentation

## 2017-11-02 LAB — POCT GLYCOSYLATED HEMOGLOBIN (HGB A1C): HEMOGLOBIN A1C: 7.1 % — AB (ref 4.0–5.6)

## 2017-11-02 LAB — GLUCOSE, POCT (MANUAL RESULT ENTRY): POC Glucose: 163 mg/dl — AB (ref 70–99)

## 2017-11-02 MED ORDER — GLUCOSE BLOOD VI STRP
ORAL_STRIP | 12 refills | Status: DC
Start: 2017-11-02 — End: 2018-05-31

## 2017-11-02 MED ORDER — SITAGLIPTIN PHOSPHATE 100 MG PO TABS: 100.0000 mg | ORAL_TABLET | Freq: Every day | ORAL | 1 refills | Status: DC

## 2017-11-02 MED ORDER — GLIPIZIDE ER 5 MG PO TB24: 5.0000 mg | ORAL_TABLET | Freq: Every day | ORAL | 0 refills | Status: DC

## 2017-11-02 NOTE — Progress Notes (Signed)
Assessment & Plan:  Norma Chambers was seen today for follow-up.  Diagnoses and all orders for this visit:  Type 2 diabetes mellitus without complication, without long-term current use of insulin (HCC) -     Glucose (CBG) -     HgB A1c -     glipiZIDE (GLIPIZIDE XL) 5 MG 24 hr tablet; Take 1 tablet (5 mg total) by mouth daily with breakfast. -     sitaGLIPtin (JANUVIA) 100 MG tablet; Take 1 tablet (100 mg total) by mouth daily. -     glucose blood test strip; Use as instructed Continue blood sugar control as discussed in office today, low carbohydrate diet, and regular physical exercise as tolerated, 150 minutes per week (30 min each day, 5 days per week, or 50 min 3 days per week). Keep blood sugar logs with fasting goal of 90-130 mg/dl, post prandial (after you eat) less than 180.  For Hypoglycemia: BS <60 and Hyperglycemia BS >400; contact the clinic ASAP. Annual eye exams and foot exams are recommended.   Patient has been counseled on age-appropriate routine health concerns for screening and prevention. These are reviewed and up-to-date. Referrals have been placed accordingly. Immunizations are up-to-date or declined.    Subjective:   Chief Complaint  Patient presents with  . Follow-up    Pt. is here to follow-up on diabetes.    HPI Norma Chambers 47 y.o. female presents to office today for follow up to DM.  Type 2 Diabetes Mellitus Disease course has been stable. There are no hypoglycemic symptoms. There are no hypoglycemic complications. Symptoms are stable. There are not diabetic complications. Risk factors for coronary artery disease include diabetes mellitus, obesity, sedentary lifestyle. Current diabetic treatment includes: Glipizide 5mg  DAILY and Metformin 1000mg  BID. Patient is compliant with treatment all of the time and monitors blood glucose at home 2 times per day.   Home blood glucose trend : She has her meter with her today with readings as follows: 7 day average 159; 14 day  average 159; 30 day average 159. Weight is  stable. Patient follows a generally healthy diet. Meal planning includes avoidance of concentrated sweets. Patient has not seen a dietician. Patient is compliant with exercise however I have given her recommendations today regarding a specific exercise regimen to help her lose weight. She is not moving enough and needs to increase her walking regimen.   An ACE inhibitor/angiotensin II receptor blocker is not being taken. Patient does not see a podiatrist. Eye exam is not current. She is   Lab Results  Component Value Date   HGBA1C 7.1 (A) 11/02/2017   Lab Results  Component Value Date   HGBA1C 7.2 08/01/2017    Review of Systems  Constitutional: Negative for fever, malaise/fatigue and weight loss.  HENT: Negative.  Negative for nosebleeds.   Eyes: Negative.  Negative for blurred vision, double vision and photophobia.  Respiratory: Negative.  Negative for cough and shortness of breath.   Cardiovascular: Negative.  Negative for chest pain, palpitations and leg swelling.  Gastrointestinal: Negative.  Negative for heartburn, nausea and vomiting.  Musculoskeletal: Negative.  Negative for myalgias.  Neurological: Negative.  Negative for dizziness, focal weakness, seizures and headaches.  Psychiatric/Behavioral: Negative.  Negative for suicidal ideas.    Past Medical History:  Diagnosis Date  . Gestational diabetes   . Obesity     Past Surgical History:  Procedure Laterality Date  . CESAREAN SECTION    . CESAREAN SECTION N/A 11/18/2012   Procedure:  REPEAT CESAREAN SECTION;  Surgeon: Catalina AntiguaPeggy Constant, MD;  Location: [facility];  Service: Obstetrics;  Laterality: N/A;    Family History  Problem Relation Age of Onset  . Diabetes Mother   . Diabetes Maternal Grandmother     Social History Reviewed with no changes to be made today.   Outpatient Medications Prior to Visit  Medication Sig Dispense Refill  . metFORMIN (GLUCOPHAGE) 1000 MG tablet  Take 1 tablet (1,000 mg total) by mouth 2 (two) times daily with a meal. 180 tablet 3  . TRUEPLUS LANCETS 28G MISC Use as directed 100 each 12  . glipiZIDE (GLIPIZIDE XL) 5 MG 24 hr tablet Take 1 tablet (5 mg total) by mouth daily with breakfast. Last fill. Must have office visit for refills. 90 tablet 1  . glucose blood test strip Use as instructed 100 each 12  . sitaGLIPtin (JANUVIA) 100 MG tablet Take 1 tablet (100 mg total) by mouth daily. 90 tablet 1   No facility-administered medications prior to visit.     No Known Allergies     Objective:    BP 128/85 (BP Location: Right Arm, Patient Position: Sitting, Cuff Size: Large)   Pulse 93   Temp 97.9 F (36.6 C) (Oral)   Ht 5\' 2"  (1.575 m)   Wt 287 lb 3.2 oz (130.3 kg)   SpO2 94%   BMI 52.53 kg/m  Wt Readings from Last 3 Encounters:  11/02/17 287 lb 3.2 oz (130.3 kg)  08/01/17 290 lb (131.5 kg)  05/21/17 286 lb (129.7 kg)    Physical Exam  Constitutional: She is oriented to person, place, and time. She appears well-developed and well-nourished. She is cooperative.  HENT:  Head: Normocephalic and atraumatic.  Eyes: EOM are normal.  Neck: Normal range of motion.  Cardiovascular: Normal rate, regular rhythm and normal heart sounds. Exam reveals no gallop and no friction rub.  No murmur heard. Pulmonary/Chest: Effort normal and breath sounds normal. No tachypnea. No respiratory distress. She has no decreased breath sounds. She has no wheezes. She has no rhonchi. She has no rales. She exhibits no tenderness.  Abdominal: Bowel sounds are normal.  Musculoskeletal: Normal range of motion. She exhibits no edema.  Neurological: She is alert and oriented to person, place, and time. Coordination normal.  Skin: Skin is warm and dry.  Psychiatric: She has a normal mood and affect. Her behavior is normal. Judgment and thought content normal.  Nursing note and vitals reviewed.        Patient has been counseled extensively about  nutrition and exercise as well as the importance of adherence with medications and regular follow-up. The patient was given clear instructions to go to ER or return to medical center if symptoms don't improve, worsen or new problems develop. The patient verbalized understanding.   Follow-up: Return for PAP SMEAR.   Claiborne RiggZelda W Zoiey Christy, FNP-BC [facility] [place], [place] [phone]   11/02/2017, 9:10 AM

## 2017-11-02 NOTE — Patient Instructions (Signed)

## 2017-11-19 MED FILL — ?METFORMIN HCL 1000MG TABS: 1000 | 30 days supply | Qty: 60 | Fill #3

## 2017-11-19 MED FILL — !JANUVIA 100MG TABLET: 100 | 30 days supply | Qty: 30 | Fill #3

## 2017-11-19 MED FILL — glipiZIDE XL 5 MG TB24: 5 | 30 days supply | Qty: 30 | Fill #3

## 2017-12-01 ENCOUNTER — Other Ambulatory Visit: Payer: Self-pay

## 2017-12-01 ENCOUNTER — Emergency Department (HOSPITAL_COMMUNITY)
Admission: EM | Admit: 2017-12-01 | Discharge: 2017-12-01 | Disposition: A | Discharge status: Home / Self Care | Payer: Medicaid Other | Attending: Emergency Medicine | Admitting: Emergency Medicine

## 2017-12-01 ENCOUNTER — Encounter (HOSPITAL_COMMUNITY): Payer: Self-pay | Admitting: Emergency Medicine

## 2017-12-01 DIAGNOSIS — Z7984 Long term (current) use of oral hypoglycemic drugs: Secondary | ICD-10-CM | POA: Insufficient documentation

## 2017-12-01 DIAGNOSIS — T783XXA Angioneurotic edema, initial encounter: Secondary | ICD-10-CM | POA: Insufficient documentation

## 2017-12-01 DIAGNOSIS — E119 Type 2 diabetes mellitus without complications: Secondary | ICD-10-CM | POA: Insufficient documentation

## 2017-12-01 LAB — BASIC METABOLIC PANEL
Anion gap: 11 (ref 5–15)
BUN: 6 mg/dL (ref 6–20)
CALCIUM: 8.9 mg/dL (ref 8.9–10.3)
CHLORIDE: 105 mmol/L (ref 98–111)
CO2: 23 mmol/L (ref 22–32)
CREATININE: 0.63 mg/dL (ref 0.44–1.00)
Glucose, Bld: 156 mg/dL — ABNORMAL HIGH (ref 70–99)
Potassium: 4.2 mmol/L (ref 3.5–5.1)
SODIUM: 139 mmol/L (ref 135–145)

## 2017-12-01 LAB — CBC WITH DIFFERENTIAL/PLATELET
Abs Immature Granulocytes: 0 10*3/uL (ref 0.0–0.1)
BASOS ABS: 0.1 10*3/uL (ref 0.0–0.1)
BASOS PCT: 1 %
EOS ABS: 0.2 10*3/uL (ref 0.0–0.7)
Eosinophils Relative: 2 %
HCT: 43.5 % (ref 36.0–46.0)
HEMOGLOBIN: 13.7 g/dL (ref 12.0–15.0)
Immature Granulocytes: 0 %
Lymphocytes Relative: 26 %
Lymphs Abs: 2.8 10*3/uL (ref 0.7–4.0)
MCH: 28.2 pg (ref 26.0–34.0)
MCHC: 31.5 g/dL (ref 30.0–36.0)
MCV: 89.7 fL (ref 78.0–100.0)
MONO ABS: 0.7 10*3/uL (ref 0.1–1.0)
MONOS PCT: 7 %
NEUTROS PCT: 64 %
Neutro Abs: 6.9 10*3/uL (ref 1.7–7.7)
PLATELETS: 334 10*3/uL (ref 150–400)
RBC: 4.85 MIL/uL (ref 3.87–5.11)
RDW: 14.1 % (ref 11.5–15.5)
WBC: 10.7 10*3/uL — ABNORMAL HIGH (ref 4.0–10.5)

## 2017-12-01 LAB — CBG MONITORING, ED: Glucose-Capillary: 152 mg/dL — ABNORMAL HIGH (ref 70–99)

## 2017-12-01 MED ORDER — DIPHENHYDRAMINE HCL 25 MG PO CAPS
50.0000 mg | ORAL_CAPSULE | Freq: Once | ORAL | Status: AC
  Administered 2017-12-01: 50 mg via ORAL
  Filled 2017-12-01: qty 2

## 2017-12-01 MED ORDER — PREDNISONE 20 MG PO TABS
60.0000 mg | ORAL_TABLET | Freq: Once | ORAL | Status: AC
  Administered 2017-12-01: 60 mg via ORAL
  Filled 2017-12-01: qty 3

## 2017-12-01 MED ORDER — FAMOTIDINE 20 MG PO TABS
40.0000 mg | ORAL_TABLET | Freq: Once | ORAL | Status: AC
  Administered 2017-12-01: 40 mg via ORAL
  Filled 2017-12-01: qty 2

## 2017-12-01 MED ORDER — PREDNISONE 20 MG PO TABS: ORAL_TABLET | ORAL | 0 refills | Status: DC

## 2017-12-01 NOTE — Discharge Instructions (Signed)
Return to the ED for worsening symptoms, sob, wheezing, feeling like your throat is closing, vomiting, diarrhea or lightheadedness.

## 2017-12-01 NOTE — ED Triage Notes (Signed)
Pt arrives with swelling to face and c/o difficulty swallowing, has insect bite to lower right jaw. States she woke up at 0715 with symptoms, no hx of allergic reactions

## 2017-12-01 NOTE — ED Provider Notes (Signed)
[facility] Provider Note   CSN: [id number] Arrival date & time: 12/01/17  0754     History   Chief Complaint Chief Complaint  Patient presents with  . Allergic Reaction    HPI Norma Chambers is a 47 y.o. female.  47 yo F with a cc of facial swelling.  Noticed this morning.  No new foods or meds.  Denies sob, n/v/d or syncope or near syncope.  Feels hot.    The history is provided by the patient.  Illness  This is a new problem. The current episode started yesterday. The problem occurs constantly. The problem has not changed since onset.Pertinent negatives include no chest pain, no headaches and no shortness of breath. Nothing aggravates the symptoms. Nothing relieves the symptoms. She has tried nothing for the symptoms. The treatment provided no relief.    Past Medical History:  Diagnosis Date  . Gestational diabetes   . Obesity     Patient Active Problem List   Diagnosis Date Noted  . DM (diabetes mellitus) (HCC) 07/10/2013  . Morbid obesity with BMI of 50.0-59.9, adult (HCC) 08/19/2012    Past Surgical History:  Procedure Laterality Date  . CESAREAN SECTION    . CESAREAN SECTION N/A 11/18/2012   Procedure: REPEAT CESAREAN SECTION;  Surgeon: Catalina AntiguaPeggy Constant, MD;  Location: [facility];  Service: Obstetrics;  Laterality: N/A;     OB History    Gravida  5   Para  3   Term  3   Preterm      AB      Living  3     SAB      TAB      Ectopic      Multiple      Live Births  1            Home Medications    Prior to Admission medications   Medication Sig Start Date End Date Taking? Authorizing Provider  glipiZIDE (GLIPIZIDE XL) 5 MG 24 hr tablet Take 1 tablet (5 mg total) by mouth daily with breakfast. 11/02/17 01/31/18 Yes Claiborne RiggFleming, Zelda W, NP  metFORMIN (GLUCOPHAGE) 1000 MG tablet Take 1 tablet (1,000 mg total) by mouth 2 (two) times daily with a meal. 08/01/17  Yes Claiborne RiggFleming, Zelda W, NP  sitaGLIPtin (JANUVIA) 100 MG  tablet Take 1 tablet (100 mg total) by mouth daily. 11/02/17  Yes Claiborne RiggFleming, Zelda W, NP  glucose blood test strip Use as instructed 11/02/17   Claiborne RiggFleming, Zelda W, NP  predniSONE (DELTASONE) 20 MG tablet 2 tabs po daily x 4 days 12/01/17   Melene PlanFloyd, Rory Montel, DO  TRUEPLUS LANCETS 28G MISC Use as directed 04/10/17   Lizbeth BarkHairston, Mandesia R, FNP    Family History Family History  Problem Relation Age of Onset  . Diabetes Mother   . Diabetes Maternal Grandmother     Social History Social History   Tobacco Use  . Smoking status: Never Smoker  . Smokeless tobacco: Never Used  Substance Use Topics  . Alcohol use: No  . Drug use: No     Allergies   Patient has no known allergies.   Review of Systems Review of Systems  Constitutional: Negative for chills and fever.  HENT: Positive for facial swelling. Negative for congestion and rhinorrhea.   Eyes: Negative for redness and visual disturbance.  Respiratory: Negative for shortness of breath and wheezing.   Cardiovascular: Negative for chest pain and palpitations.  Gastrointestinal: Negative for nausea and vomiting.  Genitourinary: Negative for dysuria and urgency.  Musculoskeletal: Negative for arthralgias and myalgias.  Skin: Negative for pallor and wound.  Neurological: Negative for dizziness and headaches.     Physical Exam Updated Vital Signs BP 110/72 (BP Location: Right Arm)   Pulse 81   Temp 98.6 F (37 C) (Oral)   Resp 18   Ht 5\' 3"  (1.6 m)   Wt 130.2 kg   SpO2 100%   BMI 50.84 kg/m   Physical Exam  Constitutional: She is oriented to person, place, and time. She appears well-developed and well-nourished. No distress.  HENT:  Head: Normocephalic and atraumatic.  Right upper lip swelling.  No noted uvula deviation, tolerating secretions without difficulty.   Right canine fractured at the gumline.  Mild surrounding erythema.  No significant tenderness or edema.  Eyes: Pupils are equal, round, and reactive to light. EOM are normal.   Neck: Normal range of motion. Neck supple.  Cardiovascular: Normal rate and regular rhythm. Exam reveals no gallop and no friction rub.  No murmur heard. Pulmonary/Chest: Effort normal. She has no wheezes. She has no rales.  Abdominal: Soft. She exhibits no distension. There is no tenderness.  Musculoskeletal: She exhibits no edema or tenderness.  Neurological: She is alert and oriented to person, place, and time.  Skin: Skin is warm. She is diaphoretic.  Psychiatric: She has a normal mood and affect. Her behavior is normal.  Nursing note and vitals reviewed.    ED Treatments / Results  Labs (all labs ordered are listed, but only abnormal results are displayed) Labs Reviewed  CBC WITH DIFFERENTIAL/PLATELET - Abnormal; Notable for the following components:      Result Value   WBC 10.7 (*)    All other components within normal limits  BASIC METABOLIC PANEL - Abnormal; Notable for the following components:   Glucose, Bld 156 (*)    All other components within normal limits  CBG MONITORING, ED - Abnormal; Notable for the following components:   Glucose-Capillary 152 (*)    All other components within normal limits    EKG None  Radiology No results found.  Procedures Procedures (including critical care time)  Medications Ordered in ED Medications  predniSONE (DELTASONE) tablet 60 mg (60 mg Oral Given 12/01/17 0823)  diphenhydrAMINE (BENADRYL) capsule 50 mg (50 mg Oral Given 12/01/17 0823)  famotidine (PEPCID) tablet 40 mg (40 mg Oral Given 12/01/17 1610)     Initial Impression / Assessment and Plan / ED Course  I have reviewed the triage vital signs and the nursing notes.  Pertinent labs & imaging results that were available during my care of the patient were reviewed by me and considered in my medical decision making (see chart for details).     47 yo F with a cc of allergic reaction.  Patient has isolated right upper lip swelling.  Suspect that this is angioedema.   She has some mild erythema to the face.  She does have poor dentition on that side and has a tooth is fractured at the gumline with some mild surrounding erythema.  There is no significant tenderness there.  No fluctuance.  Patient reassessed and is feeling much better.  Objectively her swelling to her face is decreased.  Continues to have no wheezing blood pressure stable.  Will burst dose steroids.  PCP follow-up.  10:34 AM:  I have discussed the diagnosis/risks/treatment options with the patient and believe the pt to be eligible for discharge home to follow-up with PCP. We  also discussed returning to the ED immediately if new or worsening sx occur. We discussed the sx which are most concerning (e.g., sudden worsening pain, fever, inability to tolerate by mouth) that necessitate immediate return. Medications administered to the patient during their visit and any new prescriptions provided to the patient are listed below.  Medications given during this visit Medications  predniSONE (DELTASONE) tablet 60 mg (60 mg Oral Given 12/01/17 0823)  diphenhydrAMINE (BENADRYL) capsule 50 mg (50 mg Oral Given 12/01/17 0823)  famotidine (PEPCID) tablet 40 mg (40 mg Oral Given 12/01/17 1610)      The patient appears reasonably screen and/or stabilized for discharge and I doubt any other medical condition or other [facility] requiring further screening, evaluation, or treatment in the ED at this time prior to discharge.    Final Clinical Impressions(s) / ED Diagnoses   Final diagnoses:  Angioedema, initial encounter    ED Discharge Orders         Ordered    predniSONE (DELTASONE) 20 MG tablet     12/01/17 1032           Melene Plan, DO 12/01/17 1034

## 2017-12-13 MED FILL — glipiZIDE XL 5 MG TB24: 5 | 30 days supply | Qty: 30 | Fill #4

## 2017-12-13 MED FILL — !JANUVIA 100MG TABLET: 100 | 30 days supply | Qty: 30 | Fill #4

## 2017-12-13 MED FILL — TRUE METRIX TEST STRIP: 30 days supply | Qty: 100 | Fill #5

## 2017-12-13 MED FILL — ?METFORMIN HCL 1000MG TABS: 1000 | 30 days supply | Qty: 60 | Fill #4

## 2017-12-17 ENCOUNTER — Encounter: Payer: Self-pay | Admitting: Nurse Practitioner

## 2017-12-17 ENCOUNTER — Ambulatory Visit: Payer: Self-pay | Attending: Nurse Practitioner | Admitting: Nurse Practitioner

## 2017-12-17 VITALS — BP 136/81 | HR 92 | Temp 99.2°F | Ht 62.0 in | Wt 281.0 lb

## 2017-12-17 DIAGNOSIS — Z8632 Personal history of gestational diabetes: Secondary | ICD-10-CM | POA: Insufficient documentation

## 2017-12-17 DIAGNOSIS — Z6841 Body Mass Index (BMI) 40.0 and over, adult: Secondary | ICD-10-CM | POA: Insufficient documentation

## 2017-12-17 DIAGNOSIS — Z124 Encounter for screening for malignant neoplasm of cervix: Secondary | ICD-10-CM

## 2017-12-17 DIAGNOSIS — E119 Type 2 diabetes mellitus without complications: Secondary | ICD-10-CM | POA: Insufficient documentation

## 2017-12-17 DIAGNOSIS — E669 Obesity, unspecified: Secondary | ICD-10-CM | POA: Insufficient documentation

## 2017-12-17 DIAGNOSIS — Z833 Family history of diabetes mellitus: Secondary | ICD-10-CM | POA: Insufficient documentation

## 2017-12-17 DIAGNOSIS — Z7984 Long term (current) use of oral hypoglycemic drugs: Secondary | ICD-10-CM | POA: Insufficient documentation

## 2017-12-17 DIAGNOSIS — Z01419 Encounter for gynecological examination (general) (routine) without abnormal findings: Secondary | ICD-10-CM | POA: Insufficient documentation

## 2017-12-17 LAB — GLUCOSE, POCT (MANUAL RESULT ENTRY): POC GLUCOSE: 175 mg/dL — AB (ref 70–99)

## 2017-12-17 NOTE — Progress Notes (Signed)
Assessment & Plan:  Norma Chambers was seen today for gynecologic exam.  Diagnoses and all orders for this visit:  Encounter for Papanicolaou smear for cervical cancer screening -     Cytology - PAP  Type 2 diabetes mellitus without complication, without long-term current use of insulin (HCC) -     Glucose (CBG Continue blood sugar control as discussed in office today, low carbohydrate diet, and regular physical exercise as tolerated, 150 minutes per week (30 min each day, 5 days per week, or 50 min 3 days per week). Keep blood sugar logs with fasting goal of 90-130 mg/dl, post prandial (after you eat) less than 180.  For Hypoglycemia: BS <60 and Hyperglycemia BS >400; contact the clinic ASAP. Annual eye exams and foot exams are recommended.     Patient has been counseled on age-appropriate routine health concerns for screening and prevention. These are reviewed and up-to-date. Referrals have been placed accordingly. Immunizations are up-to-date or declined.    Subjective:   Chief Complaint  Patient presents with  . Gynecologic Exam    Patient is here for a pap smear.    HPI Norma Chambers 47 y.o. female presents to office today   Review of Systems  Constitutional: Negative.  Negative for chills, fever, malaise/fatigue and weight loss.  Respiratory: Negative.  Negative for cough, shortness of breath and wheezing.   Cardiovascular: Negative.  Negative for chest pain, orthopnea and leg swelling.  Gastrointestinal: Negative for abdominal pain.  Genitourinary: Negative.  Negative for flank pain.  Skin: Negative.  Negative for rash.  Psychiatric/Behavioral: Negative for suicidal ideas.    Past Medical History:  Diagnosis Date  . Gestational diabetes   . Obesity     Past Surgical History:  Procedure Laterality Date  . CESAREAN SECTION    . CESAREAN SECTION N/A 11/18/2012   Procedure: REPEAT CESAREAN SECTION;  Surgeon: Catalina Antigua, MD;  Location: [facility];  Service: Obstetrics;   Laterality: N/A;    Family History  Problem Relation Age of Onset  . Diabetes Mother   . Diabetes Maternal Grandmother     Social History Reviewed with no changes to be made today.   Outpatient Medications Prior to Visit  Medication Sig Dispense Refill  . glipiZIDE (GLIPIZIDE XL) 5 MG 24 hr tablet Take 1 tablet (5 mg total) by mouth daily with breakfast. 90 tablet 0  . glucose blood test strip Use as instructed 100 each 12  . metFORMIN (GLUCOPHAGE) 1000 MG tablet Take 1 tablet (1,000 mg total) by mouth 2 (two) times daily with a meal. 180 tablet 3  . sitaGLIPtin (JANUVIA) 100 MG tablet Take 1 tablet (100 mg total) by mouth daily. 90 tablet 1  . TRUEPLUS LANCETS 28G MISC Use as directed 100 each 12  . predniSONE (DELTASONE) 20 MG tablet 2 tabs po daily x 4 days (Patient not taking: Reported on 12/17/2017) 8 tablet 0   No facility-administered medications prior to visit.     No Known Allergies     Objective:    BP 136/81 (BP Location: Left Arm, Patient Position: Sitting, Cuff Size: Large)   Pulse 92   Temp 99.2 F (37.3 C) (Oral)   Ht 5\' 2"  (1.575 m)   Wt 281 lb (127.5 kg)   LMP 12/03/2017   SpO2 94%   BMI 51.40 kg/m  Wt Readings from Last 3 Encounters:  12/17/17 281 lb (127.5 kg)  12/01/17 287 lb (130.2 kg)  11/02/17 287 lb 3.2 oz (130.3 kg)  Physical Exam  Constitutional: She is oriented to person, place, and time. She appears well-developed and well-nourished.  HENT:  Head: Normocephalic.  Cardiovascular: Normal rate, regular rhythm and normal heart sounds.  Pulmonary/Chest: Effort normal and breath sounds normal.  Abdominal: Soft. Bowel sounds are normal. Hernia confirmed negative in the right inguinal area and confirmed negative in the left inguinal area.  Genitourinary: Rectum normal, vagina normal and uterus normal. Rectal exam shows no external hemorrhoid. No labial fusion. There is no rash, tenderness, lesion or injury on the right labia. There is no rash,  tenderness, lesion or injury on the left labia. Uterus is not deviated and not enlarged. Cervix exhibits no motion tenderness and no friability. Right adnexum displays no mass, no tenderness and no fullness. Left adnexum displays no mass, no tenderness and no fullness. No erythema, tenderness or bleeding in the vagina. No foreign body in the vagina. No signs of injury around the vagina. No vaginal discharge found.  Lymphadenopathy: No inguinal adenopathy noted on the right or left side.       Right: No inguinal adenopathy present.       Left: No inguinal adenopathy present.  Neurological: She is alert and oriented to person, place, and time.  Skin: Skin is warm and dry.  Psychiatric: She has a normal mood and affect. Her behavior is normal. Judgment and thought content normal.      Patient has been counseled extensively about nutrition and exercise as well as the importance of adherence with medications and regular follow-up. The patient was given clear instructions to go to ER or return to medical center if symptoms don't improve, worsen or new problems develop. The patient verbalized understanding.   Follow-up: Return in about 2 months (around 02/16/2018) for DM.   Claiborne RiggZelda W Fleming, FNP-BC [facility] [place], [place] [phone]   12/17/2017, 8:54 AM

## 2017-12-18 LAB — CYTOLOGY - PAP
ADEQUACY: ABSENT
BACTERIAL VAGINITIS: NEGATIVE
CANDIDA VAGINITIS: NEGATIVE
CHLAMYDIA, DNA PROBE: NEGATIVE
DIAGNOSIS: NEGATIVE
Neisseria Gonorrhea: NEGATIVE
Trichomonas: NEGATIVE

## 2017-12-20 ENCOUNTER — Telehealth: Payer: Self-pay

## 2017-12-20 NOTE — Telephone Encounter (Signed)
CMA spoke to patient to inform on results.  Patient verified DOB. Patient understood.  Pt. Is aware repeat pap smear on the Next Office Visit.

## 2017-12-20 NOTE — Telephone Encounter (Signed)
-----   Message from Claiborne RiggZelda W Fleming, NP sent at 12/18/2017  6:19 PM EDT ----- Pap smear: not enough cervical cells. Will repeat at next office visit.

## 2018-02-05 MED FILL — $JANUVIA 100 MG TABLET: 100 | 30 days supply | Qty: 30 | Fill #5

## 2018-02-05 MED FILL — glipiZIDE XL 5 MG TB24: 5 | 30 days supply | Qty: 30 | Fill #5

## 2018-02-05 MED FILL — metFORMIN HCL 1000 MG TABS: 1000 | 30 days supply | Qty: 60 | Fill #5

## 2018-02-18 ENCOUNTER — Ambulatory Visit: Payer: Medicaid Other | Admitting: Nurse Practitioner

## 2018-02-25 ENCOUNTER — Ambulatory Visit: Payer: Self-pay | Attending: Nurse Practitioner

## 2018-03-06 ENCOUNTER — Telehealth: Payer: Self-pay | Admitting: Nurse Practitioner

## 2018-03-06 NOTE — Telephone Encounter (Signed)
Pt was inform that will be sent it by mail the blue card and the [employer] letter will take 2 to 3 week to get there

## 2018-03-06 NOTE — Telephone Encounter (Signed)
Patient came in to drop off the remaining documents. She would like to know when her OC will be ready. Please follow up with patient.

## 2018-03-12 ENCOUNTER — Ambulatory Visit: Payer: Medicaid Other | Admitting: Nurse Practitioner

## 2018-03-18 MED FILL — $JANUVIA 100 MG TABLET: 100 | 60 days supply | Qty: 60 | Fill #0

## 2018-03-18 MED FILL — glipiZIDE XL 5 MG TB24: 5 | 30 days supply | Qty: 30 | Fill #0

## 2018-03-18 MED FILL — metFORMIN HCL 1000 MG TABS: 1000 | 30 days supply | Qty: 60 | Fill #6

## 2018-04-23 ENCOUNTER — Ambulatory Visit: Payer: Medicaid Other | Admitting: Nurse Practitioner

## 2018-04-25 MED FILL — ?GLIPIZDE ER 5 MG TB24: 5 | 30 days supply | Qty: 30 | Fill #1

## 2018-04-25 MED FILL — $JANUVIA 100 MG TABLET: 100 | 90 days supply | Qty: 90 | Fill #1

## 2018-04-25 MED FILL — ?METFORMIN HCL 1000MG TABS: 1000 | 30 days supply | Qty: 60 | Fill #7

## 2018-05-31 ENCOUNTER — Encounter: Payer: Self-pay | Admitting: Nurse Practitioner

## 2018-05-31 ENCOUNTER — Ambulatory Visit: Payer: Self-pay | Attending: Nurse Practitioner | Admitting: Nurse Practitioner

## 2018-05-31 VITALS — BP 134/87 | HR 90 | Temp 98.5°F | Ht 62.0 in | Wt 287.2 lb

## 2018-05-31 DIAGNOSIS — E119 Type 2 diabetes mellitus without complications: Secondary | ICD-10-CM

## 2018-05-31 DIAGNOSIS — Z6841 Body Mass Index (BMI) 40.0 and over, adult: Secondary | ICD-10-CM

## 2018-05-31 LAB — POCT GLYCOSYLATED HEMOGLOBIN (HGB A1C): HEMOGLOBIN A1C: 7.9 % — AB (ref 4.0–5.6)

## 2018-05-31 LAB — GLUCOSE, POCT (MANUAL RESULT ENTRY): POC GLUCOSE: 130 mg/dL — AB (ref 70–99)

## 2018-05-31 MED ORDER — PHENTERMINE HCL 37.5 MG PO CAPS: 37.5000 mg | ORAL_CAPSULE | ORAL | 1 refills | Status: DC

## 2018-05-31 MED ORDER — GLIPIZIDE ER 5 MG PO TB24: 5.0000 mg | ORAL_TABLET | Freq: Every day | ORAL | 0 refills | Status: DC

## 2018-05-31 MED ORDER — METFORMIN HCL 1000 MG PO TABS: 1000.0000 mg | ORAL_TABLET | Freq: Two times a day (BID) | ORAL | 3 refills | Status: DC

## 2018-05-31 MED ORDER — SITAGLIPTIN PHOSPHATE 100 MG PO TABS: 100.0000 mg | ORAL_TABLET | Freq: Every day | ORAL | 1 refills | Status: DC

## 2018-05-31 MED ORDER — TRUEPLUS LANCETS 28G MISC: 12 refills | Status: DC

## 2018-05-31 MED ORDER — GLUCOSE BLOOD VI STRP: ORAL_STRIP | 12 refills | Status: DC

## 2018-05-31 MED FILL — glipiZIDE XL 5 MG TB24: 5 | 30 days supply | Qty: 30 | Fill #2

## 2018-05-31 MED FILL — TRUE METRIX TEST STRIP: 30 days supply | Qty: 100 | Fill #0

## 2018-05-31 MED FILL — TRUEplus LANCETS 28G MISC: 30 days supply | Qty: 100 | Fill #0

## 2018-05-31 MED FILL — ?METFORMIN HCL 1000 MG TAB: 1000 | 30 days supply | Qty: 60 | Fill #8

## 2018-05-31 NOTE — Progress Notes (Signed)
Assessment & Plan:  Norma Chambers was seenKrystelleoday for follow-up and weight loss.  Diagnoses and all orders for this visit:  Morbid obesity with BMI of 50.0-59.9, adult (HCC) -     phentermine 37.5 MG capsule; Take 1 capsule (37.5 mg total) by mouth every morning for 30 days. Discussed diet and exercise modifications.   BMI 52.  Instructed: BURN more calories than you eat. Losing 5 percent of your body weight should be considered a success. In the longer term, losing more than 15 percent of your body weight and staying at this weight is an extremely good result. However, keep in mind that even losing 5 percent of your body weight leads to important health benefits, so try not to get discouraged if you're not able to lose more than this. Will recheck weight in 1 month.  Type 2 diabetes mellitus without complication, without long-term current use of insulin (HCC) -     Glucose (CBG) -     HgB A1c -     glipiZIDE (GLIPIZIDE XL) 5 MG 24 hr tablet; Take 1 tablet (5 mg total) by mouth daily with breakfast. -     glucose blood test strip; Use as instructed -     metFORMIN (GLUCOPHAGE) 1000 MG tablet; Take 1 tablet (1,000 mg total) by mouth 2 (two) times daily with a meal. -     sitaGLIPtin (JANUVIA) 100 MG tablet; Take 1 tablet (100 mg total) by mouth daily. -     TRUEPLUS LANCETS 28G MISC; Use as directed Continue blood sugar control as discussed in office today, low carbohydrate diet, and regular physical exercise as tolerated, 150 minutes per week (30 min each day, 5 days per week, or 50 min 3 days per week). Keep blood sugar logs with fasting goal of 90-130 mg/dl, post prandial (after you eat) less than 180.  For Hypoglycemia: BS <60 and Hyperglycemia BS >400; contact the clinic ASAP. Annual eye exams and foot exams are recommended.     Patient has been counseled on age-appropriate routine health concerns for screening and prevention. These are reviewed and up-to-date. Referrals have been placed  accordingly. Immunizations are up-to-date or declined.    Subjective:   Chief Complaint  Patient presents with  . Follow-up    Pt. is here to follow-up on DM.   Marland Kitchen Weight Loss    Pt. would like to talk to PCP regarding about losing weights.    HPI Norma Chambers 48 y.o. female presents to office today for follow up to DM and requesting weight loss medication today.   DM TYPE 2 Chronic and slightly worsening. Current medications include januvia 100 mg daily,  metformin 1000 mg BID and glipizide 5 mg daily. Denies any hypo or hyperglycemic symptoms today. She has been trying to work out but can only work out for 15 minutes due to shortness of breath and deconditioning. She is not diet compliant. We discussed dietary and exercise modifications today.  Lab Results  Component Value Date   HGBA1C 7.9 (A) 05/31/2018    Review of Systems  Constitutional: Negative for fever, malaise/fatigue and weight loss.  HENT: Negative.  Negative for nosebleeds.   Eyes: Negative.  Negative for blurred vision, double vision and photophobia.  Respiratory: Negative.  Negative for cough and shortness of breath.   Cardiovascular: Negative.  Negative for chest pain, palpitations and leg swelling.  Gastrointestinal: Negative.  Negative for heartburn, nausea and vomiting.  Musculoskeletal: Negative.  Negative for myalgias.  Neurological: Negative.  Negative for dizziness, focal weakness, seizures and headaches.  Psychiatric/Behavioral: Negative.  Negative for suicidal ideas.    Past Medical History:  Diagnosis Date  . Gestational diabetes   . Obesity     Past Surgical History:  Procedure Laterality Date  . CESAREAN SECTION    . CESAREAN SECTION N/A 11/18/2012   Procedure: REPEAT CESAREAN SECTION;  Surgeon: Catalina Antigua, MD;  Location: [facility];  Service: Obstetrics;  Laterality: N/A;    Family History  Problem Relation Age of Onset  . Diabetes Mother   . Diabetes Maternal Grandmother     Social  History Reviewed with no changes to be made today.   Outpatient Medications Prior to Visit  Medication Sig Dispense Refill  . glucose blood test strip Use as instructed 100 each 12  . metFORMIN (GLUCOPHAGE) 1000 MG tablet Take 1 tablet (1,000 mg total) by mouth 2 (two) times daily with a meal. 180 tablet 3  . sitaGLIPtin (JANUVIA) 100 MG tablet Take 1 tablet (100 mg total) by mouth daily. 90 tablet 1  . TRUEPLUS LANCETS 28G MISC Use as directed 100 each 12  . glipiZIDE (GLIPIZIDE XL) 5 MG 24 hr tablet Take 1 tablet (5 mg total) by mouth daily with breakfast. 90 tablet 0  . predniSONE (DELTASONE) 20 MG tablet 2 tabs po daily x 4 days (Patient not taking: Reported on 12/17/2017) 8 tablet 0   No facility-administered medications prior to visit.     No Known Allergies     Objective:    BP 134/87 (BP Location: Left Arm, Patient Position: Sitting, Cuff Size: Large)   Pulse 90   Temp 98.5 F (36.9 C) (Oral)   Ht 5\' 2"  (1.575 m)   Wt 287 lb 3.2 oz (130.3 kg)   SpO2 98%   BMI 52.53 kg/m  Wt Readings from Last 3 Encounters:  05/31/18 287 lb 3.2 oz (130.3 kg)  12/17/17 281 lb (127.5 kg)  12/01/17 287 lb (130.2 kg)    Physical Exam Vitals signs and nursing note reviewed.  Constitutional:      Appearance: She is well-developed.  HENT:     Head: Normocephalic and atraumatic.  Neck:     Musculoskeletal: Normal range of motion.  Cardiovascular:     Rate and Rhythm: Normal rate and regular rhythm.     Heart sounds: Normal heart sounds. No murmur. No friction rub. No gallop.   Pulmonary:     Effort: Pulmonary effort is normal. No tachypnea or respiratory distress.     Breath sounds: Normal breath sounds. No decreased breath sounds, wheezing, rhonchi or rales.  Chest:     Chest wall: No tenderness.  Abdominal:     General: Bowel sounds are normal.     Palpations: Abdomen is soft.  Musculoskeletal: Normal range of motion.  Skin:    General: Skin is warm and dry.  Neurological:      Mental Status: She is alert and oriented to person, place, and time.     Coordination: Coordination normal.  Psychiatric:        Behavior: Behavior normal. Behavior is cooperative.        Thought Content: Thought content normal.        Judgment: Judgment normal.          Patient has been counseled extensively about nutrition and exercise as well as the importance of adherence with medications and regular follow-up. The patient was given clear instructions to go to ER or return to medical center  if symptoms don't improve, worsen or new problems develop. The patient verbalized understanding.   Follow-up: Return in about 4 weeks (around 06/28/2018) for F/U weight loss.   Claiborne Rigg, FNP-BC [facility] [place], [place] [phone]   05/31/2018, 6:37 PM

## 2018-05-31 NOTE — Patient Instructions (Signed)

## 2018-07-05 ENCOUNTER — Ambulatory Visit: Payer: Medicaid Other | Admitting: Nurse Practitioner

## 2018-07-08 MED FILL — ?METFORMIN HCL 1000 MG TAB: 1000 | 30 days supply | Qty: 60 | Fill #9

## 2018-07-08 MED FILL — $JANUVIA 100 MG TABLET: 100 | 30 days supply | Qty: 30 | Fill #2

## 2018-07-08 MED FILL — glipiZIDE XL 5 MG TB24: 5 | 90 days supply | Qty: 90 | Fill #0

## 2018-07-19 ENCOUNTER — Other Ambulatory Visit: Payer: Self-pay

## 2018-07-19 ENCOUNTER — Encounter: Payer: Self-pay | Admitting: Nurse Practitioner

## 2018-07-19 ENCOUNTER — Ambulatory Visit: Payer: Self-pay | Attending: Nurse Practitioner | Admitting: Nurse Practitioner

## 2018-07-19 DIAGNOSIS — E119 Type 2 diabetes mellitus without complications: Secondary | ICD-10-CM

## 2018-07-19 DIAGNOSIS — Z6841 Body Mass Index (BMI) 40.0 and over, adult: Secondary | ICD-10-CM

## 2018-07-19 MED ORDER — SITAGLIPTIN PHOSPHATE 100 MG PO TABS: 100.0000 mg | ORAL_TABLET | Freq: Every day | ORAL | 1 refills | Status: DC

## 2018-07-19 MED ORDER — GLIPIZIDE ER 5 MG PO TB24: 5.0000 mg | ORAL_TABLET | Freq: Every day | ORAL | 0 refills | Status: DC

## 2018-07-19 MED ORDER — METFORMIN HCL 1000 MG PO TABS: 1000.0000 mg | ORAL_TABLET | Freq: Two times a day (BID) | ORAL | 3 refills | Status: DC

## 2018-07-19 MED ORDER — PHENTERMINE HCL 37.5 MG PO CAPS: 37.5000 mg | ORAL_CAPSULE | ORAL | 1 refills | Status: DC

## 2018-07-19 NOTE — Progress Notes (Signed)
Virtual Visit via Telephone Note  I connected with Norma Chambers on 07/19/18 at  8:38 AM EDT by telephone and verified that I am speaking with the correct person using two identifiers.   I discussed the limitations, risks, security and privacy concerns of performing an evaluation and management service by telephone and the availability of in person appointments. I also discussed with the patient that there may be a patient responsible charge related to this service. The patient expressed understanding and agreed to proceed.   History of Present Illness: She has been prescribed phentermine 37.5mg  for weight loss she endorses medication compliance and also has made healthier dietary changes. She has not started exercising as of yet. Doing well overall.   DM TYPE 2 Not at goal. She endorses medication compliance taking glipizide 5mg  daily, metformin 1000 mg BID and Januvia 100 mg daily. She denies any hypo or hyperglycemic symptoms. Monitoring blood glucose levels 1-2 times per day. Average fasting 90-110s. Postprandial 120-130s.  Lab Results  Component Value Date   HGBA1C 7.9 (A) 05/31/2018     Observations/Objective: Awake, alert, cheerful and Oriented x3  Assessment and Plan: Morbid obesity with BMI of 50.0-59.9, adult (HCC) -     phentermine 37.5 MG capsule; Take 1 capsule (37.5 mg total) by mouth every morning for 30 days She is down 14 lbs since her last office visit.  Discussed diet and exercise for person with BMI >35. Instructed: You must burn more calories than you eat. Losing 5 percent of your body weight should be considered a success. In the longer term, losing more than 15 percent of your body weight and staying at this weight is an extremely good result. However, keep in mind that even losing 5 percent of your body weight leads to important health benefits, so try not to get discouraged if you're not able to lose more than this. Will recheck weight in 2 months.  Type 2 diabetes  mellitus without complication, without long-term current use of insulin (HCC) -     sitaGLIPtin (JANUVIA) 100 MG tablet; Take 1 tablet (100 mg total) by mouth daily. -     metFORMIN (GLUCOPHAGE) 1000 MG tablet; Take 1 tablet (1,000 mg total) by mouth 2 (two) times daily with a meal. -     glipiZIDE (GLIPIZIDE XL) 5 MG 24 hr tablet; Take 1 tablet (5 mg total) by mouth daily with breakfast. Continue blood sugar control as discussed in office today, low carbohydrate diet, and regular physical exercise as tolerated, 150 minutes per week (30 min each day, 5 days per week, or 50 min 3 days per week). Keep blood sugar logs with fasting goal of 90-130 mg/dl, post prandial (after you eat) less than 180.  For Hypoglycemia: BS <60 and Hyperglycemia BS >400; contact the clinic ASAP. Annual eye exams and foot exams are recommended.     Follow Up Instructions: 2 months for weight check  I discussed the assessment and treatment plan with the patient. The patient was provided an opportunity to ask questions and all were answered. The patient agreed with the plan and demonstrated an understanding of the instructions.   The patient was advised to call back or seek an in-person evaluation if the symptoms worsen or if the condition fails to improve as anticipated.  I provided 15 minutes of non-face-to-face time during this encounter.   Claiborne Rigg, NP

## 2018-08-20 MED FILL — ?METFORMIN HCL 1000 MG TAB: 1000 | 30 days supply | Qty: 60 | Fill #0

## 2018-08-20 MED FILL — glipiZIDE XL 5 MG TB24: 5 | 30 days supply | Qty: 30 | Fill #0

## 2018-08-20 MED FILL — $JANUVIA 100 MG TABLET: 100 | 90 days supply | Qty: 90 | Fill #0

## 2018-09-23 MED FILL — metFORMIN HCL 1000 MG TABS: 1000 | 30 days supply | Qty: 60 | Fill #1

## 2018-09-23 MED FILL — glipiZIDE XL 5 MG TB24: 5 | 30 days supply | Qty: 30 | Fill #1

## 2018-10-24 MED FILL — metFORMIN HCL 1000 MG TABS: 1000 | 30 days supply | Qty: 60 | Fill #2

## 2018-10-24 MED FILL — glipiZIDE XL 5 MG TB24: 5 | 30 days supply | Qty: 30 | Fill #2

## 2018-11-28 ENCOUNTER — Other Ambulatory Visit: Payer: Self-pay | Admitting: Nurse Practitioner

## 2018-11-28 DIAGNOSIS — E119 Type 2 diabetes mellitus without complications: Secondary | ICD-10-CM

## 2018-11-28 MED FILL — metFORMIN HCL 1000 MG TABS: 1000 | 30 days supply | Qty: 60 | Fill #3

## 2018-11-28 MED FILL — glipiZIDE XL 5 MG TB24: 5 | 30 days supply | Qty: 30 | Fill #0

## 2018-11-28 MED FILL — $JANUVIA 100 MG TABLET: 100 | 90 days supply | Qty: 90 | Fill #1

## 2019-01-07 ENCOUNTER — Other Ambulatory Visit: Payer: Self-pay | Admitting: Family Medicine

## 2019-01-07 DIAGNOSIS — E119 Type 2 diabetes mellitus without complications: Secondary | ICD-10-CM

## 2019-01-07 MED FILL — metFORMIN HCL 1000 MG TABS: 1000 | 30 days supply | Qty: 60 | Fill #4

## 2019-01-07 MED FILL — glipiZIDE XL 5 MG TB24: 5 | 30 days supply | Qty: 30 | Fill #0

## 2019-02-06 ENCOUNTER — Other Ambulatory Visit: Payer: Self-pay | Admitting: Nurse Practitioner

## 2019-02-06 DIAGNOSIS — E119 Type 2 diabetes mellitus without complications: Secondary | ICD-10-CM

## 2019-02-06 MED FILL — metFORMIN HCL 1000 MG TABS: 1000 | 30 days supply | Qty: 60 | Fill #5

## 2019-02-18 ENCOUNTER — Telehealth: Payer: Self-pay | Admitting: Nurse Practitioner

## 2019-02-18 DIAGNOSIS — E119 Type 2 diabetes mellitus without complications: Secondary | ICD-10-CM

## 2019-02-18 MED ORDER — GLIPIZIDE ER 5 MG PO TB24: ORAL_TABLET | ORAL | 0 refills | Status: DC

## 2019-02-18 NOTE — Telephone Encounter (Signed)
1) Medication(s) Requested (by name): -GLIPIZIDE XL 5 MG 24 hr tablet  -metFORMIN (GLUCOPHAGE) 1000 MG tablet  -sitaGLIPtin (JANUVIA) 100 MG tablet   2) Pharmacy of Choice: -[facility], [address]  3) Special Requests: -Until her follow up appt 03/14/2019

## 2019-02-18 NOTE — Telephone Encounter (Signed)
Checked with our pharmacy records. It is too early to fill metformin. Januvia and glipizide sent.

## 2019-02-19 MED FILL — glipiZIDE XL 5 MG TB24: 5 | 30 days supply | Qty: 30 | Fill #0

## 2019-03-14 ENCOUNTER — Ambulatory Visit: Payer: Medicaid Other | Admitting: Nurse Practitioner

## 2019-03-17 MED FILL — metFORMIN HCL 1000 MG TABS: 1000 | 30 days supply | Qty: 60 | Fill #6

## 2019-04-15 ENCOUNTER — Other Ambulatory Visit: Payer: Self-pay

## 2019-04-15 ENCOUNTER — Ambulatory Visit: Payer: Medicaid Other | Attending: Nurse Practitioner | Admitting: Nurse Practitioner

## 2019-04-15 ENCOUNTER — Encounter: Payer: Self-pay | Admitting: Nurse Practitioner

## 2019-04-15 VITALS — BP 126/85 | HR 93 | Temp 99.2°F | Ht 62.0 in | Wt 282.0 lb

## 2019-04-15 DIAGNOSIS — E785 Hyperlipidemia, unspecified: Secondary | ICD-10-CM | POA: Diagnosis not present

## 2019-04-15 DIAGNOSIS — Z79899 Other long term (current) drug therapy: Secondary | ICD-10-CM | POA: Diagnosis not present

## 2019-04-15 DIAGNOSIS — E119 Type 2 diabetes mellitus without complications: Secondary | ICD-10-CM | POA: Diagnosis not present

## 2019-04-15 DIAGNOSIS — Z833 Family history of diabetes mellitus: Secondary | ICD-10-CM | POA: Insufficient documentation

## 2019-04-15 DIAGNOSIS — Z6841 Body Mass Index (BMI) 40.0 and over, adult: Secondary | ICD-10-CM | POA: Diagnosis not present

## 2019-04-15 DIAGNOSIS — Z7984 Long term (current) use of oral hypoglycemic drugs: Secondary | ICD-10-CM | POA: Insufficient documentation

## 2019-04-15 LAB — POCT GLYCOSYLATED HEMOGLOBIN (HGB A1C): Hemoglobin A1C: 7.9 % — AB (ref 4.0–5.6)

## 2019-04-15 LAB — GLUCOSE, POCT (MANUAL RESULT ENTRY): POC Glucose: 208 mg/dl — AB (ref 70–99)

## 2019-04-15 MED ORDER — GLUCOSE BLOOD VI STRP: ORAL_STRIP | 12 refills | Status: DC

## 2019-04-15 MED ORDER — GLIPIZIDE ER 5 MG PO TB24: ORAL_TABLET | ORAL | 0 refills | Status: DC

## 2019-04-15 MED ORDER — SITAGLIPTIN PHOSPHATE 100 MG PO TABS: 100.0000 mg | ORAL_TABLET | Freq: Every day | ORAL | 1 refills | Status: DC

## 2019-04-15 MED ORDER — METFORMIN HCL 1000 MG PO TABS: 1000.0000 mg | ORAL_TABLET | Freq: Two times a day (BID) | ORAL | 3 refills | Status: DC

## 2019-04-15 MED ORDER — TRUEPLUS LANCETS 28G MISC: 12 refills | Status: DC

## 2019-04-15 MED ORDER — LISINOPRIL 2.5 MG PO TABS: 2.5000 mg | ORAL_TABLET | Freq: Every day | ORAL | 0 refills | Status: DC

## 2019-04-15 MED ORDER — ATORVASTATIN CALCIUM 20 MG PO TABS: 20.0000 mg | ORAL_TABLET | Freq: Every day | ORAL | 3 refills | Status: DC

## 2019-04-15 MED FILL — LISINOPRIL 2.5 MG TABLET: 2.5 | 90 days supply | Qty: 90 | Fill #0

## 2019-04-15 MED FILL — metFORMIN HCL 1000 MG TABS: 1000 | 30 days supply | Qty: 60 | Fill #0

## 2019-04-15 MED FILL — glipiZIDE XL 5 MG TB24: 5 | 30 days supply | Qty: 30 | Fill #0

## 2019-04-15 MED FILL — ATORVASTATIN CALCIUM 20 MG: 20 | 90 days supply | Qty: 90 | Fill #0

## 2019-04-15 NOTE — Progress Notes (Signed)
Assessment & Plan:  Norma Chambers was seen today for follow-up.  Diagnoses and all orders for this visit:  Type 2 diabetes mellitus without complication, without long-term current use of insulin (HCC) -     Glucose (CBG) -     HgB A1c -     metFORMIN (GLUCOPHAGE) 1000 MG tablet; Take 1 tablet (1,000 mg total) by mouth 2 (two) times daily with a meal. -     Discontinue: glucose blood test strip; Use as instructed -     glipiZIDE (GLIPIZIDE XL) 5 MG 24 hr tablet; TAKE 1 TABLET (5 MG TOTAL) BY MOUTH DAILY WITH BREAKFAST. -     sitaGLIPtin (JANUVIA) 100 MG tablet; Take 1 tablet (100 mg total) by mouth daily. -     Discontinue: TRUEplus Lancets 28G MISC; Use as directed -     TRUEplus Lancets 28G MISC; Use as instructed. Check blood glucose level by fingerstick twice per day.  E11.65 -     glucose blood test strip; Use as instructed. Check blood glucose level by fingerstick twice per day. E11.65 -     CBC -     Microalbumin/Creatinine Ratio, Urine -     Ambulatory referral to Ophthalmology -     lisinopril (ZESTRIL) 2.5 MG tablet; Take 1 tablet (2.5 mg total) by mouth daily. Continue blood sugar control as discussed in office today, low carbohydrate diet, and regular physical exercise as tolerated, 150 minutes per week (30 min each day, 5 days per week, or 50 min 3 days per week). Keep blood sugar logs with fasting goal of 90-130 mg/dl, post prandial (after you eat) less than 180.  For Hypoglycemia: BS <60 and Hyperglycemia BS >400; contact the clinic ASAP. Annual eye exams and foot exams are recommended.   Morbid obesity with BMI of 50.0-59.9, adult ([place]) -     CMP14+EGFR Stopped taking phentermine due to cost. Weight is back up 11lbs today.   Dyslipidemia, goal LDL below 70 -     atorvastatin (LIPITOR) 20 MG tablet; Take 1 tablet (20 mg total) by mouth daily. -     Lipid Panel INSTRUCTIONS: Work on a low fat, heart healthy diet and participate in regular aerobic exercise program by working  out at least 150 minutes per week; 5 days a week-30 minutes per day. Avoid red meat/beef/steak,  fried foods. junk foods, sodas, sugary drinks, unhealthy snacking, alcohol and smoking.  Drink at least 80 oz of water per day and monitor your carbohydrate intake daily.     Patient has been counseled on age-appropriate routine health concerns for screening and prevention. These are reviewed and up-to-date. Referrals have been placed accordingly. Immunizations are up-to-date or declined.    Subjective:   Chief Complaint  Patient presents with  . Follow-up    Pt. is here to follow up on diabetes.    HPI Norma Chambers 49 y.o. female presents to office today for follow-up to diabetes mellitus.   DM TYPE 2 Although A1c is relatively stable it is not controlled or at goal of less than 7.  Monitoring her blood glucose levels 2 times per day.  Endorses medication compliance taking Januvia 100 mg daily, Metformin 1000 mg twice daily and glipizide 5 mg daily.  She denies any symptoms of hypo or hyperglycemia.  Will order renal dose ACE and start statin today. LDL not at goal of <70. She is aware of what foods increase her glucose levels.  Lab Results  Component Value Date  HGBA1C 7.9 (A) 04/15/2019   BP Readings from Last 3 Encounters:  04/15/19 126/85  05/31/18 134/87  12/17/17 136/81   Lab Results  Component Value Date   LDLCALC 94 04/10/2017   Review of Systems  Constitutional: Negative for fever, malaise/fatigue and weight loss.  HENT: Negative.  Negative for nosebleeds.   Eyes: Negative.  Negative for blurred vision, double vision and photophobia.  Respiratory: Negative.  Negative for cough and shortness of breath.   Cardiovascular: Negative.  Negative for chest pain, palpitations and leg swelling.  Gastrointestinal: Negative.  Negative for heartburn, nausea and vomiting.  Musculoskeletal: Negative.  Negative for myalgias.  Neurological: Negative.  Negative for dizziness, focal  weakness, seizures and headaches.  Psychiatric/Behavioral: Negative.  Negative for suicidal ideas.    Past Medical History:  Diagnosis Date  . Gestational diabetes   . Obesity     Past Surgical History:  Procedure Laterality Date  . CESAREAN SECTION    . CESAREAN SECTION N/A 11/18/2012   Procedure: REPEAT CESAREAN SECTION;  Surgeon: Mora Bellman, MD;  Location: [facility];  Service: Obstetrics;  Laterality: N/A;    Family History  Problem Relation Age of Onset  . Diabetes Mother   . Diabetes Maternal Grandmother     Social History Reviewed with no changes to be made today.   Outpatient Medications Prior to Visit  Medication Sig Dispense Refill  . glipiZIDE (GLIPIZIDE XL) 5 MG 24 hr tablet TAKE 1 TABLET (5 MG TOTAL) BY MOUTH DAILY WITH BREAKFAST. MUST HAVE OFFICE VISIT FOR REFILLS 30 tablet 0  . glucose blood test strip Use as instructed 100 each 12  . metFORMIN (GLUCOPHAGE) 1000 MG tablet Take 1 tablet (1,000 mg total) by mouth 2 (two) times daily with a meal. 180 tablet 3  . sitaGLIPtin (JANUVIA) 100 MG tablet Take 1 tablet (100 mg total) by mouth daily. 90 tablet 1  . TRUEPLUS LANCETS 28G MISC Use as directed 100 each 12  . phentermine 37.5 MG capsule Take 1 capsule (37.5 mg total) by mouth every morning for 30 days. 30 capsule 1   No facility-administered medications prior to visit.    No Known Allergies     Objective:    BP 126/85 (BP Location: Left Arm, Patient Position: Sitting, Cuff Size: Large)   Pulse 93   Temp 99.2 F (37.3 C) (Oral)   Ht '5\' 2"'$  (1.575 m)   Wt 282 lb (127.9 kg)   LMP 04/11/2019 (LMP Unknown)   SpO2 97%   BMI 51.58 kg/m  Wt Readings from Last 3 Encounters:  04/15/19 282 lb (127.9 kg)  07/19/18 273 lb (123.8 kg)  05/31/18 287 lb 3.2 oz (130.3 kg)    Physical Exam Vitals and nursing note reviewed.  Constitutional:      Appearance: She is well-developed.  HENT:     Head: Normocephalic and atraumatic.  Cardiovascular:     Rate and  Rhythm: Normal rate and regular rhythm.     Heart sounds: Normal heart sounds. No murmur. No friction rub. No gallop.   Pulmonary:     Effort: Pulmonary effort is normal. No tachypnea or respiratory distress.     Breath sounds: Normal breath sounds. No decreased breath sounds, wheezing, rhonchi or rales.  Chest:     Chest wall: No tenderness.  Abdominal:     General: Bowel sounds are normal.     Palpations: Abdomen is soft.  Musculoskeletal:        General: Normal range of motion.  Cervical back: Normal range of motion.  Skin:    General: Skin is warm and dry.  Neurological:     Mental Status: She is alert and oriented to person, place, and time.     Coordination: Coordination normal.  Psychiatric:        Behavior: Behavior normal. Behavior is cooperative.        Thought Content: Thought content normal.        Judgment: Judgment normal.          Patient has been counseled extensively about nutrition and exercise as well as the importance of adherence with medications and regular follow-up. The patient was given clear instructions to go to ER or return to medical center if symptoms don't improve, worsen or new problems develop. The patient verbalized understanding.   Follow-up: Return in about 3 months (around 07/14/2019).   Gildardo Pounds, FNP-BC [facility], [place]   04/15/2019, 1:02 PM

## 2019-04-16 ENCOUNTER — Other Ambulatory Visit: Payer: Self-pay | Admitting: Pharmacist

## 2019-04-16 DIAGNOSIS — E119 Type 2 diabetes mellitus without complications: Secondary | ICD-10-CM

## 2019-04-16 LAB — CMP14+EGFR
ALT: 74 IU/L — ABNORMAL HIGH (ref 0–32)
AST: 68 IU/L — ABNORMAL HIGH (ref 0–40)
Albumin/Globulin Ratio: 1.5 (ref 1.2–2.2)
Albumin: 4.5 g/dL (ref 3.8–4.8)
Alkaline Phosphatase: 81 IU/L (ref 39–117)
BUN/Creatinine Ratio: 10 (ref 9–23)
BUN: 6 mg/dL (ref 6–24)
Bilirubin Total: 0.3 mg/dL (ref 0.0–1.2)
CO2: 23 mmol/L (ref 20–29)
Calcium: 9.6 mg/dL (ref 8.7–10.2)
Chloride: 98 mmol/L (ref 96–106)
Creatinine, Ser: 0.63 mg/dL (ref 0.57–1.00)
GFR calc Af Amer: 124 mL/min/{1.73_m2} (ref >59)
GFR calc non Af Amer: 108 mL/min/{1.73_m2} (ref >59)
Globulin, Total: 3 g/dL (ref 1.5–4.5)
Glucose: 199 mg/dL — ABNORMAL HIGH (ref 65–99)
Potassium: 4.6 mmol/L (ref 3.5–5.2)
Sodium: 137 mmol/L (ref 134–144)
Total Protein: 7.5 g/dL (ref 6.0–8.5)

## 2019-04-16 LAB — CBC
Hematocrit: 43.9 % (ref 34.0–46.6)
Hemoglobin: 14.4 g/dL (ref 11.1–15.9)
MCH: 28.6 pg (ref 26.6–33.0)
MCHC: 32.8 g/dL (ref 31.5–35.7)
MCV: 87 fL (ref 79–97)
Platelets: 358 10*3/uL (ref 150–450)
RBC: 5.04 x10E6/uL (ref 3.77–5.28)
RDW: 13.1 % (ref 11.7–15.4)
WBC: 9.3 10*3/uL (ref 3.4–10.8)

## 2019-04-16 LAB — MICROALBUMIN / CREATININE URINE RATIO
Creatinine, Urine: 25.3 mg/dL
Microalb/Creat Ratio: 17 mg/g creat (ref 0–29)
Microalbumin, Urine: 4.3 ug/mL

## 2019-04-16 LAB — LIPID PANEL
Chol/HDL Ratio: 4.5 ratio — ABNORMAL HIGH (ref 0.0–4.4)
Cholesterol, Total: 204 mg/dL — ABNORMAL HIGH (ref 100–199)
HDL: 45 mg/dL (ref >39)
LDL Chol Calc (NIH): 124 mg/dL — ABNORMAL HIGH (ref 0–99)
Triglycerides: 196 mg/dL — ABNORMAL HIGH (ref 0–149)
VLDL Cholesterol Cal: 35 mg/dL (ref 5–40)

## 2019-04-16 MED ORDER — ACCU-CHEK GUIDE VI STRP: ORAL_STRIP | 11 refills | Status: DC

## 2019-04-16 MED ORDER — ACCU-CHEK GUIDE ME W/DEVICE KIT: 1.0000 | PACK | Freq: Two times a day (BID) | 0 refills | Status: DC

## 2019-04-16 MED ORDER — ACCU-CHEK SOFT TOUCH LANCETS MISC: 12 refills | Status: DC

## 2019-04-16 MED ORDER — ACCU-CHEK FASTCLIX LANCETS MISC: 11 refills | Status: DC

## 2019-04-16 MED FILL — ACCU-CHEK SOFTCLIX LANCETS: 25 days supply | Qty: 100 | Fill #0

## 2019-04-16 MED FILL — ACCU-CHEK GUIDE W/DEVICE KI: W/DEVICE | 30 days supply | Qty: 1 | Fill #0

## 2019-04-16 MED FILL — ACCU-CHEK GUIDE TEST STRIP: 50 days supply | Qty: 100 | Fill #0

## 2019-05-12 MED FILL — glipiZIDE XL 5 MG TB24: 5 | 30 days supply | Qty: 30 | Fill #1

## 2019-05-12 MED FILL — metFORMIN HCL 1000 MG TABS: 1000 | 30 days supply | Qty: 60 | Fill #1

## 2019-05-14 MED FILL — ACCU-CHEK GUIDE TEST STRIP: 50 days supply | Qty: 100 | Fill #0

## 2019-05-14 MED FILL — ACCU-CHEK GUIDE W/DEVICE KI: W/DEVICE | 30 days supply | Qty: 1 | Fill #0

## 2019-05-14 MED FILL — ACCU-CHEK SOFTCLIX LANCETS: 25 days supply | Qty: 100 | Fill #0

## 2019-06-06 ENCOUNTER — Other Ambulatory Visit: Payer: Self-pay | Admitting: Nurse Practitioner

## 2019-06-06 DIAGNOSIS — E119 Type 2 diabetes mellitus without complications: Secondary | ICD-10-CM

## 2019-06-06 MED FILL — glipiZIDE XL 5 MG TB24: 5 | 30 days supply | Qty: 30 | Fill #2

## 2019-06-06 MED FILL — metFORMIN HCL 1000 MG TABS: 1000 | 30 days supply | Qty: 60 | Fill #2

## 2019-06-25 ENCOUNTER — Ambulatory Visit: Payer: Medicaid Other

## 2019-07-09 ENCOUNTER — Other Ambulatory Visit: Payer: Self-pay | Admitting: Nurse Practitioner

## 2019-07-09 DIAGNOSIS — E119 Type 2 diabetes mellitus without complications: Secondary | ICD-10-CM

## 2019-07-09 MED FILL — LISINOPRIL 2.5 MG TABLET: 2.5 | 90 days supply | Qty: 90 | Fill #0

## 2019-07-09 MED FILL — ATORVASTATIN CALCIUM 20 MG: 20 | 90 days supply | Qty: 90 | Fill #1

## 2019-07-09 MED FILL — metFORMIN HCL 1000 MG TABS: 1000 | 90 days supply | Qty: 180 | Fill #3

## 2019-07-09 MED FILL — glipiZIDE XL 5 MG TB24: 5 | 30 days supply | Qty: 30 | Fill #0

## 2019-07-11 MED FILL — ACCU-CHEK GUIDE TEST STRIP: 50 days supply | Qty: 100 | Fill #1

## 2019-07-11 MED FILL — ACCU-CHEK SOFTCLIX LANCETS: 25 days supply | Qty: 100 | Fill #1

## 2019-07-16 ENCOUNTER — Other Ambulatory Visit: Payer: Self-pay | Admitting: Nurse Practitioner

## 2019-07-16 ENCOUNTER — Other Ambulatory Visit: Payer: Self-pay

## 2019-07-16 ENCOUNTER — Telehealth: Payer: Self-pay

## 2019-07-16 ENCOUNTER — Encounter: Payer: Self-pay | Admitting: Nurse Practitioner

## 2019-07-16 ENCOUNTER — Ambulatory Visit: Payer: Medicaid Other | Attending: Nurse Practitioner | Admitting: Nurse Practitioner

## 2019-07-16 DIAGNOSIS — Z833 Family history of diabetes mellitus: Secondary | ICD-10-CM | POA: Diagnosis not present

## 2019-07-16 DIAGNOSIS — Z7984 Long term (current) use of oral hypoglycemic drugs: Secondary | ICD-10-CM | POA: Insufficient documentation

## 2019-07-16 DIAGNOSIS — J302 Other seasonal allergic rhinitis: Secondary | ICD-10-CM

## 2019-07-16 DIAGNOSIS — E1165 Type 2 diabetes mellitus with hyperglycemia: Secondary | ICD-10-CM | POA: Insufficient documentation

## 2019-07-16 DIAGNOSIS — Z79899 Other long term (current) drug therapy: Secondary | ICD-10-CM | POA: Insufficient documentation

## 2019-07-16 DIAGNOSIS — E785 Hyperlipidemia, unspecified: Secondary | ICD-10-CM | POA: Diagnosis not present

## 2019-07-16 DIAGNOSIS — I10 Essential (primary) hypertension: Secondary | ICD-10-CM | POA: Diagnosis not present

## 2019-07-16 DIAGNOSIS — E119 Type 2 diabetes mellitus without complications: Secondary | ICD-10-CM | POA: Diagnosis present

## 2019-07-16 MED ORDER — OLOPATADINE HCL 0.2 % OP SOLN: 1.0000 [drp] | Freq: Every day | OPHTHALMIC | 1 refills | Status: DC

## 2019-07-16 MED ORDER — LISINOPRIL 2.5 MG PO TABS: 2.5000 mg | ORAL_TABLET | Freq: Every day | ORAL | 1 refills | Status: DC

## 2019-07-16 MED ORDER — AZELASTINE HCL 0.05 % OP SOLN: 1.0000 [drp] | Freq: Two times a day (BID) | OPHTHALMIC | 12 refills | Status: DC

## 2019-07-16 MED ORDER — GLIPIZIDE ER 5 MG PO TB24: ORAL_TABLET | ORAL | 1 refills | Status: DC

## 2019-07-16 NOTE — Progress Notes (Signed)
Pt states her blood sugar was 113

## 2019-07-16 NOTE — Telephone Encounter (Signed)
Thank you Tresa Endo. I really appreciate it when you give me an alternative. It helps a lot!

## 2019-07-16 NOTE — Progress Notes (Signed)
Virtual Visit via Telephone Note Due to national recommendations of social distancing due to [place] 19, telehealth visit is felt to be most appropriate for this patient at this time.  I discussed the limitations, risks, security and privacy concerns of performing an evaluation and management service by telephone and the availability of in person appointments. I also discussed with the patient that there may be a patient responsible charge related to this service. The patient expressed understanding and agreed to proceed.    I connected with Norma Chambers on 07/16/19  at   9:30 AM EDT  EDT by telephone and verified that I am speaking with the correct person using two identifiers.   Consent I discussed the limitations, risks, security and privacy concerns of performing an evaluation and management service by telephone and the availability of in person appointments. I also discussed with the patient that there may be a patient responsible charge related to this service. The patient expressed understanding and agreed to proceed.   Location of Patient: Private Residence    Location of Provider: [place] [facility] [place] participating in Telemedicine visit: Geryl Rankins FNP-BC [place]    History of Present Illness: Telemedicine visit for: Follow Up/Meter Check  DM TYPE 2 Monitoring twice per day. Fasting average: 110-120. Postprandial 130s. She is currently practicing Ramadan Holiday. She is fasting. Recommendations are to not take metformin 1000 mg, januvia 100 mg and glipizide XR 77m if she is not eating a full meal. Currently on STATIN and renal dose ACE. LDL not at goal of <70. Taking lipitor 20 mg as prescribed. Will check repeat lipid panel.  Lab Results  Component Value Date   HGBA1C 7.9 (A) 04/15/2019   Lab Results  Component Value Date   LDLCALC 124 (H) 04/15/2019    Past Medical History:  Diagnosis Date  . Gestational diabetes    . Obesity   . Type 2 diabetes mellitus with hyperglycemia, without long-term current use of insulin ([facility]     Past Surgical History:  Procedure Laterality Date  . CESAREAN SECTION    . CESAREAN SECTION N/A 11/18/2012   Procedure: REPEAT CESAREAN SECTION;  Surgeon: PMora Bellman MD;  [facility];  Service: Obstetrics;  Laterality: N/A;    Family History  Problem Relation Age of Onset  . Diabetes Mother   . Diabetes Maternal Grandmother     Social History   Socioeconomic History  . Marital status: Married    Spouse name: Not on file  . Number of children: Not on file  . Years of education: Not on file  . Highest education level: Not on file  Occupational History  . Not on file  Tobacco Use  . Smoking status: Never Smoker  . Smokeless tobacco: Never Used  Substance and Sexual Activity  . Alcohol use: No  . Drug use: No  . Sexual activity: Not Currently  Other Topics Concern  . Not on file  Social History Narrative  . Not on file   Social Determinants of Health   Financial Resource Strain:   . Difficulty of Paying Living Expenses:   Food Insecurity:   . Worried About RCharity fundraiserin the Last Year:   . RArboriculturistin the Last Year:   Transportation Needs:   . LFilm/video editor(Medical):   .Marland KitchenLack of Transportation (Non-Medical):   Physical Activity:   . Days of Exercise per Week:   .  Minutes of Exercise per Session:   Stress:   . Feeling of Stress :   Social Connections:   . Frequency of Communication with Friends and Family:   . Frequency of Social Gatherings with Friends and Family:   . Attends Religious Services:   . Active Member of Clubs or Organizations:   . Attends Archivist Meetings:   Marland Kitchen Marital Status:      Observations/Objective: Awake, alert and oriented x 3   Review of Systems  Constitutional: Negative for fever, malaise/fatigue and weight loss.  HENT: Negative.  Negative for nosebleeds.   Eyes: Negative.   Negative for blurred vision, double vision and photophobia.       Watery eyes  Respiratory: Negative.  Negative for cough and shortness of breath.   Cardiovascular: Negative.  Negative for chest pain, palpitations and leg swelling.  Gastrointestinal: Negative.  Negative for heartburn, nausea and vomiting.  Musculoskeletal: Negative.  Negative for myalgias.  Neurological: Negative.  Negative for dizziness, focal weakness, seizures and headaches.  Endo/Heme/Allergies: Positive for environmental allergies.  Psychiatric/Behavioral: Negative.  Negative for suicidal ideas.    Assessment and Plan: [name] was seen today for diabetes and hypertension.  Diagnoses and all orders for this visit:  Type 2 diabetes mellitus with hyperglycemia, without long-term current use of insulin ([place]) -     Ambulatory referral to Ophthalmology -     CMP14+EGFR; Future -     Hemoglobin A1c; Future -     glipiZIDE (GLIPIZIDE XL) 5 MG 24 hr tablet; TAKE 1 TABLET (5 MG TOTAL) BY MOUTH DAILY WITH BREAKFAST. -     lisinopril (ZESTRIL) 2.5 MG tablet; Take 1 tablet (2.5 mg total) by mouth daily. Continue blood sugar control as discussed in office today, low carbohydrate diet, and regular physical exercise as tolerated, 150 minutes per week (30 min each day, 5 days per week, or 50 min 3 days per week). Keep blood sugar logs with fasting goal of 90-130 mg/dl, post prandial (after you eat) less than 180.  For Hypoglycemia: BS <60 and Hyperglycemia BS >400; contact the clinic ASAP. Annual eye exams and foot exams are recommended.   Seasonal allergies -     azelastine (OPTIVAR) 0.05 % ophthalmic solution; Place 1 drop into both eyes 2 (two) times daily.  Dyslipidemia, goal LDL below 70 -     Lipid panel; Future INSTRUCTIONS: Work on a low fat, heart healthy diet and participate in regular aerobic exercise program by working out at least 150 minutes per week; 5 days a week-30 minutes per day. Avoid red meat/beef/steak,  fried  foods. junk foods, sodas, sugary drinks, unhealthy snacking, alcohol and smoking.  Drink at least 80 oz of water per day and monitor your carbohydrate intake daily.     Follow Up Instructions Return in about 3 months (around 10/15/2019).     I discussed the assessment and treatment plan with the patient. The patient was provided an opportunity to ask questions and all were answered. The patient agreed with the plan and demonstrated an understanding of the instructions.   The patient was advised to call back or seek an in-person evaluation if the symptoms worsen or if the condition fails to improve as anticipated.  I provided 14 minutes of non-face-to-face time during this encounter including median intraservice time, reviewing previous notes, labs, imaging, medications and explaining diagnosis and management.  Gildardo Pounds, FNP-BC

## 2019-07-16 NOTE — Telephone Encounter (Signed)
AZELASTINE IS NON-PREFERRED UNDER PT'S MEDICAID, IF APPROPRIATE CAN YOU CHANGE TO PATADAY OR PAZEO?

## 2019-07-17 MED FILL — OLOPATADINE HCL 0.2 % SOLN: 0.2 | 34 days supply | Qty: 3 | Fill #0

## 2019-07-18 ENCOUNTER — Other Ambulatory Visit: Payer: Medicaid Other

## 2019-09-26 MED FILL — ACCU-CHEK GUIDE TEST STRIP: 50 days supply | Qty: 100 | Fill #2

## 2019-10-03 MED FILL — metFORMIN HCL 1000 MG TABS: 1000 | 90 days supply | Qty: 180 | Fill #4

## 2019-10-07 ENCOUNTER — Telehealth: Payer: Self-pay | Admitting: Nurse Practitioner

## 2019-10-07 MED FILL — LISINOPRIL 2.5 MG TABLET: 2.5 | 90 days supply | Qty: 90 | Fill #0

## 2019-10-07 MED FILL — ATORVASTATIN CALCIUM 20 MG: 20 | 90 days supply | Qty: 90 | Fill #2

## 2019-10-07 NOTE — Telephone Encounter (Signed)
Stopped by the office and requested for listed medication to be refilled and sent to [facility]  metFORMIN (GLUCOPHAGE) 1000 MG tablet [[id number]]  atorvastatin (LIPITOR) 20 MG tablet [[id number]]  lisinopril (ZESTRIL) 2.5 MG tablet [[id number]]

## 2019-10-07 NOTE — Telephone Encounter (Signed)
Patient has rxs available in our pharmacy. I have processed these for her.

## 2019-10-22 ENCOUNTER — Ambulatory Visit: Payer: Medicaid Other | Admitting: Family Medicine

## 2019-11-17 MED FILL — glipiZIDE XL 5 MG TB24: 5 | 90 days supply | Qty: 90 | Fill #1

## 2019-12-10 ENCOUNTER — Other Ambulatory Visit: Payer: Self-pay

## 2019-12-10 ENCOUNTER — Encounter (HOSPITAL_COMMUNITY): Payer: Self-pay

## 2019-12-10 ENCOUNTER — Emergency Department (HOSPITAL_COMMUNITY)
Admission: EM | Admit: 2019-12-10 | Discharge: 2019-12-10 | Disposition: A | Discharge status: Home / Self Care | Payer: Medicaid Other | Attending: Emergency Medicine | Admitting: Emergency Medicine

## 2019-12-10 DIAGNOSIS — Z7984 Long term (current) use of oral hypoglycemic drugs: Secondary | ICD-10-CM | POA: Insufficient documentation

## 2019-12-10 DIAGNOSIS — E1165 Type 2 diabetes mellitus with hyperglycemia: Secondary | ICD-10-CM | POA: Diagnosis not present

## 2019-12-10 DIAGNOSIS — R000 Tachycardia, unspecified: Secondary | ICD-10-CM | POA: Insufficient documentation

## 2019-12-10 DIAGNOSIS — R202 Paresthesia of skin: Secondary | ICD-10-CM | POA: Diagnosis present

## 2019-12-10 DIAGNOSIS — G510 Bell's palsy: Secondary | ICD-10-CM | POA: Diagnosis not present

## 2019-12-10 MED ORDER — PREDNISONE 20 MG PO TABS: ORAL_TABLET | ORAL | 0 refills | Status: DC

## 2019-12-10 MED ORDER — VALACYCLOVIR HCL 1 G PO TABS: 1000.0000 mg | ORAL_TABLET | Freq: Three times a day (TID) | ORAL | 0 refills | Status: DC

## 2019-12-10 MED ORDER — PREDNISONE 20 MG PO TABS
60.0000 mg | ORAL_TABLET | Freq: Once | ORAL | Status: AC
  Administered 2019-12-10: 60 mg via ORAL
  Filled 2019-12-10: qty 3

## 2019-12-10 MED ORDER — VALACYCLOVIR HCL 500 MG PO TABS
500.0000 mg | ORAL_TABLET | Freq: Once | ORAL | Status: AC
  Administered 2019-12-10: 500 mg via ORAL
  Filled 2019-12-10: qty 1

## 2019-12-10 MED FILL — valACYclovir HCL 1 GM TABS: 1 | 7 days supply | Qty: 21 | Fill #0

## 2019-12-10 MED FILL — predniSONE 20 MG TABS: 20 | 21 days supply | Qty: 21 | Fill #0

## 2019-12-10 NOTE — ED Provider Notes (Signed)
[facility] EMERGENCY DEPARTMENT Provider Note   CSN: [id number] Arrival date & time: 12/10/19  1005     History Chief Complaint  Patient presents with  . facial numbness/ pain    Norma Chambers is a 49 y.o. female.  The history is provided by the patient. No language interpreter was used.     49 year old female significant history of diabetes, obesity recurrent headache presenting complaining of facial droop.  Patient report yesterday she developed pain to the left side of face and head.  Pain is sharp throbbing gradual in onset and has been waxing waning.  This morning while brushing her teeth she realized that the left side of her face was droop which is new.  She also endorsed tingling sensation to the left side of face.  She was concerned prompting this ER visit.  She did not complain of any confusion, fever, chills, hearing loss, numbness or weakness in her arms or legs, chest pain or trouble breathing.  She denies any recent sick contact.  She has not been vaccinated for COVID-19.  Past Medical History:  Diagnosis Date  . Gestational diabetes   . Obesity   . Type 2 diabetes mellitus with hyperglycemia, without long-term current use of insulin [facility])     Patient Active Problem List   Diagnosis Date Noted  . DM (diabetes mellitus) ([place]) 07/10/2013  . Morbid obesity with BMI of 50.0-59.9, adult ([place]) 08/19/2012    Past Surgical History:  Procedure Laterality Date  . CESAREAN SECTION    . CESAREAN SECTION N/A 11/18/2012   Procedure: REPEAT CESAREAN SECTION;  Surgeon: Mora Bellman, MD;  Location: [facility];  Service: Obstetrics;  Laterality: N/A;     OB History    Gravida  5   Para  3   Term  3   Preterm      AB      Living  3     SAB      TAB      Ectopic      Multiple      Live Births  1           Family History  Problem Relation Age of Onset  . Diabetes Mother   . Diabetes Maternal Grandmother     Social History   Tobacco  Use  . Smoking status: Never Smoker  . Smokeless tobacco: Never Used  Vaping Use  . Vaping Use: Never used  Substance Use Topics  . Alcohol use: No  . Drug use: No    Home Medications Prior to Admission medications   Medication Sig Start Date End Date Taking? Authorizing Provider  atorvastatin (LIPITOR) 20 MG tablet Take 1 tablet (20 mg total) by mouth daily. 04/15/19   Gildardo Pounds, NP  Blood Glucose Monitoring Suppl (ACCU-CHEK GUIDE ME) w/Device KIT 1 kit by Does not apply route 2 (two) times daily. Use as instructed. Check blood glucose level by fingerstick twice per day. E11.65 04/16/19   Charlott Rakes, MD  glipiZIDE (GLIPIZIDE XL) 5 MG 24 hr tablet TAKE 1 TABLET (5 MG TOTAL) BY MOUTH DAILY WITH BREAKFAST. 07/16/19   Gildardo Pounds, NP  glucose blood (ACCU-CHEK GUIDE) test strip Use as instructed. Check blood glucose level by fingerstick twice per day. E11.65 04/16/19   Charlott Rakes, MD  Lancets (ACCU-CHEK SOFT TOUCH) lancets Use as instructed 04/16/19   Charlott Rakes, MD  lisinopril (ZESTRIL) 2.5 MG tablet Take 1 tablet (2.5 mg total) by mouth daily.  07/16/19 10/14/19  Gildardo Pounds, NP  metFORMIN (GLUCOPHAGE) 1000 MG tablet Take 1 tablet (1,000 mg total) by mouth 2 (two) times daily with a meal. 04/15/19   Gildardo Pounds, NP  Olopatadine HCl (PATADAY) 0.2 % SOLN Apply 1 drop to eye daily. 07/16/19   Gildardo Pounds, NP  sitaGLIPtin (JANUVIA) 100 MG tablet Take 1 tablet (100 mg total) by mouth daily. 04/15/19   Gildardo Pounds, NP    Allergies    Patient has no known allergies.  Review of Systems   Review of Systems  All other systems reviewed and are negative.   Physical Exam Updated Vital Signs BP (!) 145/103   Pulse (!) 106   Temp 98.5 F (36.9 C) (Oral)   Resp 16   Ht 5' 3"  (1.6 m)   Wt 127.9 kg   SpO2 99%   BMI 49.95 kg/m   Physical Exam Vitals and nursing note reviewed.  Constitutional:      General: She is not in acute distress.    Appearance:  She is well-developed.  HENT:     Head: Atraumatic.     Right Ear: Tympanic membrane normal.     Left Ear: Tympanic membrane normal.     Nose: Nose normal.     Mouth/Throat:     Mouth: Mucous membranes are moist.  Eyes:     Conjunctiva/sclera: Conjunctivae normal.  Cardiovascular:     Rate and Rhythm: Tachycardia present.     Pulses: Normal pulses.     Heart sounds: Normal heart sounds.  Pulmonary:     Effort: Pulmonary effort is normal.     Breath sounds: Normal breath sounds. No wheezing or rhonchi.  Abdominal:     Palpations: Abdomen is soft.     Tenderness: There is no abdominal tenderness.  Musculoskeletal:     Cervical back: Neck supple.  Skin:    Findings: No rash.  Neurological:     Mental Status: She is alert and oriented to person, place, and time.     Comments: Neurologic exam:  Speech clear, pupils equal round reactive to light, extraocular movements intact  Normal peripheral visual fields Left facial droop involving forehead with decrease sensation Follows commands, moves all extremities x4, normal strength to bilateral upper and lower extremities at all major muscle groups including grip Sensation normal to light touch and pinprick Coordination intact, no limb ataxia, finger-nose-finger normal Rapid alternating movements normal No pronator drift Gait normal   Psychiatric:        Mood and Affect: Mood normal.     ED Results / Procedures / Treatments   Labs (all labs ordered are listed, but only abnormal results are displayed) Labs Reviewed - No data to display  EKG None  Radiology No results found.  Procedures Procedures (including critical care time)  Medications Ordered in ED Medications  predniSONE (DELTASONE) tablet 60 mg (60 mg Oral Given 12/10/19 1142)  valACYclovir (VALTREX) tablet 500 mg (500 mg Oral Given 12/10/19 1142)    ED Course  I have reviewed the triage vital signs and the nursing notes.  Pertinent labs & imaging results that  were available during my care of the patient were reviewed by me and considered in my medical decision making (see chart for details).    MDM Rules/Calculators/A&P                          BP (!) 145/103   Pulse Marland Kitchen)  106   Temp 98.5 F (36.9 C) (Oral)   Resp 16   Ht 5' 3"  (1.6 m)   Wt 127.9 kg   SpO2 99%   BMI 49.95 kg/m   Final Clinical Impression(s) / ED Diagnoses Final diagnoses:  Bell's palsy    Rx / DC Orders ED Discharge Orders         Ordered    predniSONE (DELTASONE) 20 MG tablet        12/10/19 1300    valACYclovir (VALTREX) 1000 MG tablet  3 times daily        12/10/19 1300         11:21 AM Patient here with complaints of gradual onset headache, left ear pain and now facial droop involving her left forehead and her left eye lid since this morning.  Finding is strongly suggestive of Bell's palsy.  She does not have any other focal deficit.  Low suspicion for stroke.  Ear exam unremarkable, no history of herpetic infection in the past.  She does have history of diabetes.  Will treat with prednisone 60 mg daily for 1 week as well as valacyclovir.  She will need to apply eyedrops regularly to decrease risk of dry eye, and she will need to follow-up closely with her PCP for further care.  Care discussed with Dr. Regenia Skeeter.    Domenic Moras, PA-C 12/10/19 1301    Sherwood Gambler, MD 12/10/19 [id number]

## 2019-12-10 NOTE — Discharge Instructions (Addendum)
Please apply OTC artificial tears eyedrops to left eye every 2 hrs for the next 1-2 weeks to decrease risk of dry eyes.  Take medications prescribed.  Follow up closely with your doctor for further care.  Return if you have any concerns.

## 2019-12-10 NOTE — ED Triage Notes (Addendum)
Patient complains of headache that she had all day yesterday. Took ibuprofen with relief and this am has numbness to left face, unable to raise eyebrow. Speech clear, no drift, states that she has pain to left side of face, mouth droop noted.

## 2019-12-11 ENCOUNTER — Telehealth: Payer: Self-pay | Admitting: *Deleted

## 2019-12-11 NOTE — Telephone Encounter (Signed)
Contacted patient to complete Transition of Care Assessment: Transition Care Management Follow-up Telephone Call  Date of discharge and from where: 12/10/19, [facility]  How have you been since you were released from the hospital? "it's good"  Any questions or concerns? No  Items Reviewed:  Did the pt receive and understand the discharge instructions provided? Yes   Medications obtained and verified? Yes patient is on the way to pick up medications this morning  Any new allergies since your discharge? No   Dietary orders reviewed? YES  Do you have support at home? Yes   Functional Questionnaire: (I = Independent and D = Dependent) ADLs: I  Bathing/Dressing- I  Meal Prep- I  Eating- I  Maintaining continence- I  Transferring/Ambulation- I  Managing Meds- I  Follow up appointments reviewed:   PCP Hospital f/u appt confirmed? Yes patient previously scheduled to see Dr Cain Saupe on 12/19/19 @ 0930.She is to call on 12/11/19 to see if she needs to be seen sooner  Specialist Hospital f/u appt confirmed? No    Are transportation arrangements needed? No   If their condition worsens, is the pt aware to call PCP or go to the Emergency Dept.? YES  Was the patient provided with contact information for the PCP's office or ED? YES   Was to pt encouraged to call back with questions or concerns? YES  Burnard Bunting, RN, BSN, CCRN [facility]agement [facility] [phone]

## 2019-12-17 MED FILL — ACCU-CHEK GUIDE TEST STRIP: 34 days supply | Qty: 100 | Fill #3

## 2019-12-19 ENCOUNTER — Ambulatory Visit: Payer: Medicaid Other | Attending: Family Medicine | Admitting: Family Medicine

## 2019-12-19 ENCOUNTER — Other Ambulatory Visit: Payer: Self-pay

## 2019-12-19 ENCOUNTER — Encounter: Payer: Self-pay | Admitting: Family Medicine

## 2019-12-19 VITALS — BP 130/82 | HR 105 | Ht 63.0 in | Wt 285.0 lb

## 2019-12-19 DIAGNOSIS — E785 Hyperlipidemia, unspecified: Secondary | ICD-10-CM | POA: Diagnosis not present

## 2019-12-19 DIAGNOSIS — G518 Other disorders of facial nerve: Secondary | ICD-10-CM | POA: Diagnosis not present

## 2019-12-19 DIAGNOSIS — H9202 Otalgia, left ear: Secondary | ICD-10-CM

## 2019-12-19 DIAGNOSIS — G510 Bell's palsy: Secondary | ICD-10-CM

## 2019-12-19 DIAGNOSIS — H6692 Otitis media, unspecified, left ear: Secondary | ICD-10-CM

## 2019-12-19 DIAGNOSIS — E1169 Type 2 diabetes mellitus with other specified complication: Secondary | ICD-10-CM

## 2019-12-19 DIAGNOSIS — Z09 Encounter for follow-up examination after completed treatment for conditions other than malignant neoplasm: Secondary | ICD-10-CM

## 2019-12-19 DIAGNOSIS — E1165 Type 2 diabetes mellitus with hyperglycemia: Secondary | ICD-10-CM | POA: Diagnosis not present

## 2019-12-19 LAB — GLUCOSE, POCT (MANUAL RESULT ENTRY): POC Glucose: 180 mg/dL — AB (ref 70–99)

## 2019-12-19 LAB — POCT GLYCOSYLATED HEMOGLOBIN (HGB A1C): HbA1c, POC (controlled diabetic range): 9.1 % — AB (ref 0.0–7.0)

## 2019-12-19 MED ORDER — AMOXICILLIN 500 MG PO CAPS: 500.0000 mg | ORAL_CAPSULE | Freq: Two times a day (BID) | ORAL | 0 refills | Status: DC

## 2019-12-19 MED ORDER — GABAPENTIN 100 MG PO CAPS: ORAL_CAPSULE | ORAL | 3 refills | Status: DC

## 2019-12-19 MED ORDER — VALACYCLOVIR HCL 1 G PO TABS: 1000.0000 mg | ORAL_TABLET | Freq: Three times a day (TID) | ORAL | 0 refills | Status: DC

## 2019-12-19 MED FILL — GABAPENTIN 100 MG CAPSULE: 100 | 30 days supply | Qty: 120 | Fill #0

## 2019-12-19 MED FILL — AMOXICILLIN 500 MG CAPSULE: 500 | 7 days supply | Qty: 14 | Fill #0

## 2019-12-19 MED FILL — valACYclovir HCL 1 GM TABS: 1 | 7 days supply | Qty: 21 | Fill #0

## 2019-12-19 NOTE — Progress Notes (Signed)
Patient is having pain in left side of face and left side of head.

## 2019-12-19 NOTE — Progress Notes (Signed)
Established Patient Office Visit  Subjective:  Patient ID: Norma Chambers, female    DOB: 1970-05-15  Age: 49 y.o. MRN: [id number]  CC: ED follow-up/DM/Chronic issues  HPI Norma Chambers, 49 year old female, patient of Geryl Rankins, NP,  with type 2 diabetes without use of insulin, hyperlipidemia and obesity who is status post ED visit on 12/10/2019 due to the complaint of left-sided numbness and left facial weakness after experiencing facial pain on 12/09/2019.  She received diagnosis of Bell's palsy.  She was treated with prednisone and valacyclovir. Her most recent hemoglobin A1c on 04/15/2019 was 7.9.  Lipid panel in January with total cholesterol of 204, triglycerides of 196 and LDL of 124 for which she was prescribed atorvastatin.         She reports that her surgeon as she finished/completed the prednisone and valacyclovir prescribed at the emergency department, she had return of left-sided facial pain similar to pain at initial onset of Bell's palsy.  She is using moisturizing drops to her left eye as she still has decreased ability to keep her left eyelids closed.  She sometimes finds that she bites the inside of her cheek when chewing.  She denies any difficulty swallowing.  She has had no other focal numbness or weakness.  She also reports left-sided ear pain which feels as if it is in the inside of her ear and is dull and aching, pain is occasionally sharp.  Her blood sugars have been somewhat elevated while she was on prednisone with some fasting blood sugars in the high 100s to 200.  She has had some increased thirst and frequent urination while on prednisone.  She denies any increased muscle or joint pain with her use of atorvastatin.  Past Medical History:  Diagnosis Date  . Gestational diabetes   . Obesity   . Type 2 diabetes mellitus with hyperglycemia, without long-term current use of insulin [facility])     Past Surgical History:  Procedure Laterality Date  . CESAREAN SECTION    .  CESAREAN SECTION N/A 11/18/2012   Procedure: REPEAT CESAREAN SECTION;  Surgeon: Mora Bellman, MD;  Location: [facility];  Service: Obstetrics;  Laterality: N/A;    Family History  Problem Relation Age of Onset  . Diabetes Mother   . Diabetes Maternal Grandmother     Social History   Socioeconomic History  . Marital status: Married    Spouse name: Not on file  . Number of children: Not on file  . Years of education: Not on file  . Highest education level: Not on file  Occupational History  . Not on file  Tobacco Use  . Smoking status: Never Smoker  . Smokeless tobacco: Never Used  Vaping Use  . Vaping Use: Never used  Substance and Sexual Activity  . Alcohol use: No  . Drug use: No  . Sexual activity: Not Currently  Other Topics Concern  . Not on file  Social History Narrative  . Not on file   Social Determinants of Health   Financial Resource Strain:   . Difficulty of Paying Living Expenses: Not on file  Food Insecurity:   . Worried About Charity fundraiser in the Last Year: Not on file  . Ran Out of Food in the Last Year: Not on file  Transportation Needs:   . Lack of Transportation (Medical): Not on file  . Lack of Transportation (Non-Medical): Not on file  Physical Activity:   . Days of Exercise per Week: Not on  file  . Minutes of Exercise per Session: Not on file  Stress:   . Feeling of Stress : Not on file  Social Connections:   . Frequency of Communication with Friends and Family: Not on file  . Frequency of Social Gatherings with Friends and Family: Not on file  . Attends Religious Services: Not on file  . Active Member of Clubs or Organizations: Not on file  . Attends Archivist Meetings: Not on file  . Marital Status: Not on file  Intimate Partner Violence:   . Fear of Current or Ex-Partner: Not on file  . Emotionally Abused: Not on file  . Physically Abused: Not on file  . Sexually Abused: Not on file    Outpatient Medications Prior to  Visit  Medication Sig Dispense Refill  . atorvastatin (LIPITOR) 20 MG tablet Take 1 tablet (20 mg total) by mouth daily. (Patient taking differently: Take 20 mg by mouth daily with lunch. ) 90 tablet 3  . Blood Glucose Monitoring Suppl (ACCU-CHEK GUIDE ME) w/Device KIT 1 kit by Does not apply route 2 (two) times daily. Use as instructed. Check blood glucose level by fingerstick twice per day. E11.65 1 kit 0  . glipiZIDE (GLIPIZIDE XL) 5 MG 24 hr tablet TAKE 1 TABLET (5 MG TOTAL) BY MOUTH DAILY WITH BREAKFAST. 90 tablet 1  . glucose blood (ACCU-CHEK GUIDE) test strip Use as instructed. Check blood glucose level by fingerstick twice per day. E11.65 100 each 11  . ibuprofen (ADVIL) 200 MG tablet Take 400 mg by mouth every 6 (six) hours as needed for mild pain.    . Lancets (ACCU-CHEK SOFT TOUCH) lancets Use as instructed 100 each 12  . lisinopril (ZESTRIL) 2.5 MG tablet Take 1 tablet (2.5 mg total) by mouth daily. (Patient taking differently: Take 2.5 mg by mouth daily with lunch. ) 90 tablet 1  . metFORMIN (GLUCOPHAGE) 1000 MG tablet Take 1 tablet (1,000 mg total) by mouth 2 (two) times daily with a meal. 180 tablet 3  . Olopatadine HCl (PATADAY) 0.2 % SOLN Apply 1 drop to eye daily. (Patient not taking: Reported on 12/10/2019) 2.5 mL 1  . predniSONE (DELTASONE) 20 MG tablet 60m daily x 7 days 21 tablet 0  . sitaGLIPtin (JANUVIA) 100 MG tablet Take 1 tablet (100 mg total) by mouth daily. (Patient not taking: Reported on 12/10/2019) 90 tablet 1  . valACYclovir (VALTREX) 1000 MG tablet Take 1 tablet (1,000 mg total) by mouth 3 (three) times daily. 21 tablet 0   No facility-administered medications prior to visit.    No Known Allergies  ROS Review of Systems  Constitutional: Positive for fatigue. Negative for chills and fever.  HENT: Positive for ear pain and facial swelling. Negative for sore throat and trouble swallowing.   Respiratory: Negative for cough and shortness of breath.   Cardiovascular:  Negative for chest pain and palpitations.  Gastrointestinal: Negative for abdominal pain, constipation, diarrhea and nausea.  Endocrine: Positive for polydipsia, polyphagia and polyuria.       Increased symptoms and increased blood sugars while on prednisone  Genitourinary: Positive for frequency (when blood sugar is high). Negative for dysuria.  Musculoskeletal: Negative for arthralgias and back pain.  Skin: Negative for rash and wound.  Neurological: Positive for facial asymmetry, numbness and headaches.  Hematological: Negative for adenopathy. Does not bruise/bleed easily.      Objective:    Physical Exam Vitals and nursing note reviewed.  Constitutional:      Appearance:  She is obese.  HENT:     Head: Normocephalic and atraumatic.     Left Ear: Ear canal and external ear normal.     Ears:     Comments: Left TM is pink and without visible landmarks, slightly retracted Neck:     Vascular: No carotid bruit.  Cardiovascular:     Rate and Rhythm: Normal rate and regular rhythm.  Pulmonary:     Effort: Pulmonary effort is normal.     Breath sounds: Normal breath sounds.  Abdominal:     Palpations: Abdomen is soft.     Tenderness: There is no abdominal tenderness. There is no guarding or rebound.  Musculoskeletal:     Cervical back: Normal range of motion and neck supple.     Right lower leg: No edema.     Left lower leg: No edema.  Lymphadenopathy:     Cervical: No cervical adenopathy.  Skin:    General: Skin is warm and dry.  Neurological:     Mental Status: She is alert and oriented to person, place, and time.     Cranial Nerves: Cranial nerve deficit (Left-sided facial weakness and abnormality of sensation) present.     Comments: No tenderness to palpation over the trigeminal nerve canal  Psychiatric:        Mood and Affect: Mood normal.        Behavior: Behavior normal.     BP 130/82   Pulse (!) 105   Ht 5' 3"  (1.6 m)   Wt 285 lb (129.3 kg)   SpO2 95%   BMI  50.49 kg/m  Wt Readings from Last 3 Encounters:  12/10/19 282 lb (127.9 kg)  04/15/19 282 lb (127.9 kg)  07/19/18 273 lb (123.8 kg)     Health Maintenance Due  Topic Date Due  . Hepatitis C Screening  Never done  . PNEUMOCOCCAL POLYSACCHARIDE VACCINE AGE 86-64 HIGH RISK  Never done  . OPHTHALMOLOGY EXAM  Never done  . COVID-19 Vaccine (1) Never done  . FOOT EXAM  04/10/2018  . HEMOGLOBIN A1C  10/13/2019  . INFLUENZA VACCINE  11/02/2019      Lab Results  Component Value Date   TSH 1.780 08/01/2017   Lab Results  Component Value Date   WBC 9.3 04/15/2019   HGB 14.4 04/15/2019   HCT 43.9 04/15/2019   MCV 87 04/15/2019   PLT 358 04/15/2019   Lab Results  Component Value Date   NA 137 04/15/2019   K 4.6 04/15/2019   CO2 23 04/15/2019   GLUCOSE 199 (H) 04/15/2019   BUN 6 04/15/2019   CREATININE 0.63 04/15/2019   BILITOT 0.3 04/15/2019   ALKPHOS 81 04/15/2019   AST 68 (H) 04/15/2019   ALT 74 (H) 04/15/2019   PROT 7.5 04/15/2019   ALBUMIN 4.5 04/15/2019   CALCIUM 9.6 04/15/2019   ANIONGAP 11 12/01/2017   Lab Results  Component Value Date   CHOL 204 (H) 04/15/2019   Lab Results  Component Value Date   HDL 45 04/15/2019   Lab Results  Component Value Date   LDLCALC 124 (H) 04/15/2019   Lab Results  Component Value Date   TRIG 196 (H) 04/15/2019   Lab Results  Component Value Date   CHOLHDL 4.5 (H) 04/15/2019   Lab Results  Component Value Date   HGBA1C 7.9 (A) 04/15/2019      Assessment & Plan:  1. Type 2 diabetes mellitus with hyperglycemia, without long-term current use of insulin (HCC)  Post prandial glucose of 180 and Hgb A1c of 9.1 which is increased from A1c of 7.9 in Jan of this year however she has also recently completed prednisone therapy. She has been asked to increase water intake, continue her medications, monitoring of blood sugars and a low carb diet. Schedule follow-up with PCP regarding control of blood sugars.  - POCT glucose  (manual entry) - POCT glycosylated hemoglobin (Hb A1C) - Comprehensive metabolic panel  2. Hyperlipidemia associated with type 2 diabetes mellitus ([place]) Continue use of atorvastatin and a low fat diet. CMET in follow-up of use of statin medication.  3. Bell's palsy; 4. Encounter for examination following treatment at hospital; 5. Neuralgic facial pain Notes from her 12/10/2019 ED visit reviewed and discussed with patient at today's visit. She reports that her facial pain re-occurred as soon as she finished prednisone and valtrex. She is provided with a refill of valtrex.  Additional prednisone not provided due to her uncontrolled diabetes with hyperglycemia.  Exam was not consistent with trigeminal neuralgia and is most likely related to recurrent Bell's palsy. Rx provided for gabapentin to see if this will also help with her facial pain. Referral made for Neurology follow-up. Continue moisturizing eyedrops due to continue difficulty keeping the left eyelid fully closed. Eat slowly and chew carefully to help avoid biting the inside of the cheek.  - valACYclovir (VALTREX) 1000 MG tablet; Take 1 tablet (1,000 mg total) by mouth 3 (three) times daily.  Dispense: 21 tablet; Refill: 0 - Ambulatory referral to Neurology - gabapentin (NEURONTIN) 100 MG capsule; One pill twice per day and two pills at bedtime as needed for facial pain  Dispense: 120 capsule; Refill: 3 - Ambulatory referral to Neurology  6. Ear pain, left; 7. Acute left otitis media Ear pain may also be related to her Bell's palsy but patient also with abnormal appearance to the TM on exam. RX for amoxicillin for treatment of otitis media and she can take otc pain medication as needed.  - amoxicillin (AMOXIL) 500 MG capsule; Take 1 capsule (500 mg total) by mouth 2 (two) times daily.  Dispense: 14 capsule; Refill: 0     Follow-up: Return in about 2 weeks (around 01/02/2020) for chronic issues with PCP-Fleming.   Antony Blackbird, MD

## 2019-12-20 LAB — COMPREHENSIVE METABOLIC PANEL WITH GFR
ALT: 59 IU/L — ABNORMAL HIGH (ref 0–32)
AST: 50 IU/L — ABNORMAL HIGH (ref 0–40)
Albumin/Globulin Ratio: 1.6 (ref 1.2–2.2)
Albumin: 4.2 g/dL (ref 3.8–4.8)
Alkaline Phosphatase: 75 IU/L (ref 44–121)
BUN/Creatinine Ratio: 20 (ref 9–23)
BUN: 14 mg/dL (ref 6–24)
Bilirubin Total: 0.3 mg/dL (ref 0.0–1.2)
CO2: 25 mmol/L (ref 20–29)
Calcium: 9.8 mg/dL (ref 8.7–10.2)
Chloride: 95 mmol/L — ABNORMAL LOW (ref 96–106)
Creatinine, Ser: 0.69 mg/dL (ref 0.57–1.00)
GFR calc Af Amer: 121 mL/min/1.73
GFR calc non Af Amer: 105 mL/min/1.73
Globulin, Total: 2.7 g/dL (ref 1.5–4.5)
Glucose: 174 mg/dL — ABNORMAL HIGH (ref 65–99)
Potassium: 4.5 mmol/L (ref 3.5–5.2)
Sodium: 136 mmol/L (ref 134–144)
Total Protein: 6.9 g/dL (ref 6.0–8.5)

## 2019-12-22 NOTE — Progress Notes (Signed)
[facility]    Provider:  Dr Jaynee Eagles Requesting Provider: Antony Blackbird, NP Primary Care Provider:  Gildardo Pounds, NP  CC:  Bells palsy  HPI:  Norma Chambers is a 49 y.o. female here as requested by Dr. Chapman Fitch for bell's palsy. PMHx DM2, obesity.  I reviewed Dr. Chapman Fitch notes: Patient was seen by Dr.Fulp September 17 as a follow-up to her emergency room visit on September 8 both of this year.  Per Dr. Chapman Fitch, patient visited the emergency room with a complaint of left-sided numbness and left facial weakness after experiencing pain on December 09, 2019, she received a diagnosis of Bell's palsy, she was treated with prednisone valacyclovir, her most recent hemoglobin A1c in January of this year was 7.9 with a total cholesterol of 204, triglycerides 196, LDL 124 and she was prescribed atorvastatin, she finished completed the prednisone and is Cyclovir and she had return of left-sided facial symptoms similar to pain at initial onset of Bell's palsy, she has decreased ability to keep her left eyelids closed, she sometimes bites the inside of her cheek when chewing, no other focal numbness or weakness, she also reports left ear pain which feels as if it is in the inside of her ear and is dull and aching, occasionally sharp, her blood sugars have been somewhat elevated while she was on the prednisone with some fasting glucose sugars in the high 100-2 100s, some increased thirst with frequent urination while on prednisone.  She is on atorvastatin and denies joint pain.  Repeat hemoglobin A1c was 9.1 which was increased from January of this year, this was also though in the setting of prednisone.  Additional prednisone was not prescribed due to her uncontrolled diabetes with hyperglycemia, exam was consistent with Bell's palsy and she was recommended to continue moisturizing eyedrops, eat slowly and carefully, gabapentin as needed for facial pain, eye examination it also showed that patient had an  abnormal appearance of the TM on exam so she was given amoxicillin for treatment of otitis media.  I do not see any imaging of the brain completed when reviewing epic chart.   She is here with her husband who also provides information, she was diagnosed with Bell's palsy and she has significant facial pain and ear pain and hearing changes, started December 10, 2019, left side is affected, she is able to close her left eye, she is taking Valtrex and gabapentin, the gabapentin may be helps a little bit, she is never had Bell's palsy but she has had facial pain in the past. Started with a headache 3-4 days beforehand, then on 9/8 woke up with left facial weakness, she didn;t know what happened, she went to eat and she bit her tongue, she went ED. She is already imptoving, she can open her eye. She has left ear pain, sound bothers her and hearing changed, she has a lot of headache on the left. No tick bites, no illnesses prior, water did get in her ear. No numbnessno vision changes, no weakness, no other symptoms. Gabapentin is helping, can increase she prefers 155m tid and not 3044m No other focal neurologic deficits, associated symptoms, inciting events or modifiable factors.   Reviewed notes, labs and imaging from outside physicians, which showed: see above  Review of Systems: Patient complains of symptoms per HPI as well as the following symptoms: left facial weakness and pain Pertinent negatives and positives per HPI. All others negative.   Social History   Socioeconomic History  . Marital  status: Married    Spouse name: Not on file  . Number of children: Not on file  . Years of education: Not on file  . Highest education level: Not on file  Occupational History  . Not on file  Tobacco Use  . Smoking status: Never Smoker  . Smokeless tobacco: Never Used  Vaping Use  . Vaping Use: Never used  Substance and Sexual Activity  . Alcohol use: No  . Drug use: No  . Sexual activity: Not  Currently  Other Topics Concern  . Not on file  Social History Narrative   Lives at home with husband and 3 children   Right handed   Caffeine: 1 cup/day   Social Determinants of Health   Financial Resource Strain:   . Difficulty of Paying Living Expenses: Not on file  Food Insecurity:   . Worried About Charity fundraiser in the Last Year: Not on file  . Ran Out of Food in the Last Year: Not on file  Transportation Needs:   . Lack of Transportation (Medical): Not on file  . Lack of Transportation (Non-Medical): Not on file  Physical Activity:   . Days of Exercise per Week: Not on file  . Minutes of Exercise per Session: Not on file  Stress:   . Feeling of Stress : Not on file  Social Connections:   . Frequency of Communication with Friends and Family: Not on file  . Frequency of Social Gatherings with Friends and Family: Not on file  . Attends Religious Services: Not on file  . Active Member of Clubs or Organizations: Not on file  . Attends Archivist Meetings: Not on file  . Marital Status: Not on file  Intimate Partner Violence:   . Fear of Current or Ex-Partner: Not on file  . Emotionally Abused: Not on file  . Physically Abused: Not on file  . Sexually Abused: Not on file    Family History  Problem Relation Age of Onset  . Diabetes Mother   . Diabetes Maternal Grandmother   . Bell's palsy Neg Hx     Past Medical History:  Diagnosis Date  . Gestational diabetes   . Obesity   . Type 2 diabetes mellitus with hyperglycemia, without long-term current use of insulin [facility])     Patient Active Problem List   Diagnosis Date Noted  . Bell's palsy 12/23/2019  . DM (diabetes mellitus) ([place]) 07/10/2013  . Morbid obesity with BMI of 50.0-59.9, adult ([place]) 08/19/2012    Past Surgical History:  Procedure Laterality Date  . CESAREAN SECTION    . CESAREAN SECTION N/A 11/18/2012   Procedure: REPEAT CESAREAN SECTION;  Surgeon: Mora Bellman, MD;  Location: [facility];  Service: Obstetrics;  Laterality: N/A;    Current Outpatient Medications  Medication Sig Dispense Refill  . atorvastatin (LIPITOR) 20 MG tablet Take 1 tablet (20 mg total) by mouth daily. (Patient taking differently: Take 20 mg by mouth daily with lunch. ) 90 tablet 3  . Blood Glucose Monitoring Suppl (ACCU-CHEK GUIDE ME) w/Device KIT 1 kit by Does not apply route 2 (two) times daily. Use as instructed. Check blood glucose level by fingerstick twice per day. E11.65 1 kit 0  . glipiZIDE (GLIPIZIDE XL) 5 MG 24 hr tablet TAKE 1 TABLET (5 MG TOTAL) BY MOUTH DAILY WITH BREAKFAST. 90 tablet 1  . glucose blood (ACCU-CHEK GUIDE) test strip Use as instructed. Check blood glucose level by fingerstick twice per day. E11.65  100 each 11  . Lancets (ACCU-CHEK SOFT TOUCH) lancets Use as instructed 100 each 12  . lisinopril (ZESTRIL) 2.5 MG tablet Take 1 tablet (2.5 mg total) by mouth daily. (Patient taking differently: Take 2.5 mg by mouth daily with lunch. ) 90 tablet 1  . metFORMIN (GLUCOPHAGE) 1000 MG tablet Take 1 tablet (1,000 mg total) by mouth 2 (two) times daily with a meal. 180 tablet 3  . valACYclovir (VALTREX) 1000 MG tablet Take 1 tablet (1,000 mg total) by mouth 3 (three) times daily. 21 tablet 0  . amoxicillin (AMOXIL) 500 MG capsule Take 1 capsule (500 mg total) by mouth 2 (two) times daily. (Patient not taking: Reported on 12/23/2019) 14 capsule 0  . gabapentin (NEURONTIN) 100 MG capsule Take 1-2 capsules (100-200 mg total) by mouth 3 (three) times daily as needed. 180 capsule 1   No current facility-administered medications for this visit.    Allergies as of 12/23/2019  . (No Known Allergies)    Vitals: BP 133/81 (BP Location: Left Arm, Patient Position: Sitting, Cuff Size: Large)   Pulse 84   Ht _0  (1.6 m)   Wt 287 lb (130.2 kg)   BMI 50.84 kg/m  Last Weight:  Wt Readings from Last 1 Encounters:  12/23/19 287 lb (130.2 kg)   Last Height:   Ht Readings from Last 1  Encounters:  12/23/19 _1  (1.6 m)     Physical exam: Exam: Gen: NAD, conversant, well nourised, obese, well groomed                     CV: RRR, no MRG. No Carotid Bruits. No peripheral edema, warm, nontender Eyes: Conjunctivae clear without exudates or hemorrhage  Neuro: Detailed Neurologic Exam  Speech:    Speech is normal; fluent and spontaneous with normal comprehension.  Cognition:    The patient is oriented to person, place, and time;     recent and remote memory intact;     language fluent;     normal attention, concentration,     fund of knowledge Cranial Nerves:    The pupils are equal, round, and reactive to light attempted funduscopy could not visualize, visual fields are full to finger, mentation, extraocular movements are intact, trigeminal sensation is intact, left facial lower droop, impaired eyebrow raise, weakness of left eye closure The palate elevates in the midline. Hearing intact. Voice is normal. Shoulder shrug is normal. The tongue has normal motion without fasciculations.   Coordination:    No dysmetria or ataxia  Gait:    Slightly wide-based due to large body habitus but overall normal native gait  Motor Observation:    No asymmetry, no atrophy, and no involuntary movements noted. Tone:    Normal muscle tone.    Posture:    Posture is normal. normal erect    Strength:    Strength is V/V in the upper and lower limbs.      Sensation: intact to LT     Reflex Exam:  DTR's: Absent AJ's otherwise    Deep tendon reflexes in the upper and lower extremities are 1+ bilaterally.   Toes:    The toes are downgoing bilaterally.   Clonus:    Clonus is absent.    Assessment/Plan:  49 year old with Bell's palsy.  She has associated ear pain and hearing changes which may be due to the Bell's palsy however per Dr. Siri Cole exam showed possible otitis media and patient has not started her antibiotics.  She has been treated already with prednisone and  antiviral and at this time I would not give her any more prednisone due to her diabetes.  I discussed it takes time for Bell's palsy sometimes upwards of 2 to 3 months to improve and that most people do significantly improve.  However given the symptoms I do think she would benefit from an MRI of the brain just to ensure there is no other issues given her hearing changes, ear pain, cranial nerve VII palsy, facial pain to evaluate for any other causes such as infiltrating lesions, compressive tumors, demyelination, stroke or other.Will check labs, no risk factors for HIV, lyme unlikely I this area per ID, will check sjogren's and sarcoid. Increase Gabapentin. Start antibiotic. Refer to OT ([facility] does not do it but we can find other locations). Since she is already improving after only a few weeks, she has a good prognostic outlook, we discussed eye care, keeping the eye lubricated, risk for corneal damage and blindness, eat slowly so as not to damage tongue or lip or choke, wear sunglasses to protect eye, follow-up as needed.  Orders Placed This Encounter  Procedures  . MR BRAIN W WO CONTRAST  . Angiotensin converting enzyme  . Sjogren's syndrome antibods(ssa + ssb)  . Ambulatory referral to Occupational Therapy   Meds ordered this encounter  Medications  . gabapentin (NEURONTIN) 100 MG capsule    Sig: Take 1-2 capsules (100-200 mg total) by mouth 3 (three) times daily as needed.    Dispense:  180 capsule    Refill:  1    Cc: Antony Blackbird, MD,  Gildardo Pounds, NP  Sarina Ill, MD  [facility] [address] [place], [place] [address]  Phone [phone] Fax [phone]

## 2019-12-23 ENCOUNTER — Encounter: Payer: Self-pay | Admitting: Neurology

## 2019-12-23 ENCOUNTER — Ambulatory Visit: Payer: Medicaid Other | Admitting: Neurology

## 2019-12-23 ENCOUNTER — Other Ambulatory Visit: Payer: Self-pay

## 2019-12-23 VITALS — BP 133/81 | HR 84 | Ht 63.0 in | Wt 287.0 lb

## 2019-12-23 DIAGNOSIS — G510 Bell's palsy: Secondary | ICD-10-CM | POA: Diagnosis not present

## 2019-12-23 DIAGNOSIS — H9202 Otalgia, left ear: Secondary | ICD-10-CM

## 2019-12-23 DIAGNOSIS — R519 Headache, unspecified: Secondary | ICD-10-CM | POA: Diagnosis not present

## 2019-12-23 DIAGNOSIS — H9190 Unspecified hearing loss, unspecified ear: Secondary | ICD-10-CM

## 2019-12-23 MED ORDER — GABAPENTIN 100 MG PO CAPS: 100.0000 mg | ORAL_CAPSULE | Freq: Three times a day (TID) | ORAL | 1 refills | Status: DC | PRN

## 2019-12-23 NOTE — Patient Instructions (Addendum)
MRI brain - will call Increase Gabapentin to 100mg  three times a day 2 blood labs today OccupationalTherapy - will call Start antibiotics   Bell Palsy, Adult  Bell palsy is a short-term inability to move muscles in part of the face. The inability to move (paralysis) results from inflammation or compression of the facial nerve, which travels along the skull and under the ear to the side of the face (7th cranial nerve). This nerve is responsible for facial movements that include blinking, closing the eyes, smiling, and frowning. What are the causes? The exact cause of this condition is not known. It may be caused by an infection from a virus, such as the chickenpox (herpes zoster), Epstein-Barr, or mumps virus. What increases the risk? You are more likely to develop this condition if:  You are pregnant.  You have diabetes.  You have had a recent infection in your nose, throat, or airways (upper respiratory infection).  You have a weakened body defense system (immune system).  You have had a facial injury, such as a fracture.  You have a family history of Bell palsy. What are the signs or symptoms? Symptoms of this condition include:  Weakness on one side of the face.  Drooping eyelid and corner of the mouth.  Excessive tearing in one eye.  Difficulty closing the eyelid.  Dry eye.  Drooling.  Dry mouth.  Changes in taste.  Change in facial appearance.  Pain behind one ear.  Ringing in one or both ears.  Sensitivity to sound in one ear.  Facial twitching.  Headache.  Impaired speech.  Dizziness.  Difficulty eating or drinking. Most of the time, only one side of the face is affected. Rarely, Bell palsy affects the whole face. How is this diagnosed? This condition is diagnosed based on:  Your symptoms.  Your medical history.  A physical exam. You may also have to see health care providers who specialize in disorders of the nerves (neurologist) or  diseases and conditions of the eye (ophthalmologist). You may have tests, such as:  A test to check for nerve damage (electromyogram).  Imaging studies, such as CT or MRI scans.  Blood tests. How is this treated? This condition affects every person differently. Sometimes symptoms go away without treatment within a couple weeks. If treatment is needed, it varies from person to person. The goal of treatment is to reduce inflammation and protect the eye from damage. Treatment for Bell palsy may include:  Medicines, such as: ? Steroids to reduce swelling and inflammation. ? Antiviral drugs. ? Pain relievers, including aspirin, acetaminophen, or ibuprofen.  Eye drops or ointment to keep your eye moist.  Eye protection, if you cannot close your eye.  Exercises or massage to regain muscle strength and function (physical therapy). Follow these instructions at home:   Take over-the-counter and prescription medicines only as told by your health care provider.  If your eye is affected: ? Keep your eye moist with eye drops or ointment as told by your health care provider. ? Follow instructions for eye care and protection as told by your health care provider.  Do any physical therapy exercises as told by your health care provider.  Keep all follow-up visits as told by your health care provider. This is important. Contact a health care provider if:  You have a fever.  Your symptoms do not get better within 2-3 weeks, or your symptoms get worse.  Your eye is red, irritated, or painful.  You have new  symptoms. Get help right away if:  You have weakness or numbness in a part of your body other than your face.  You have trouble swallowing.  You develop neck pain or stiffness.  You develop dizziness or shortness of breath. Summary  Bell palsy is a short-term inability to move muscles in part of the face. The inability to move (paralysis) results from inflammation or compression of  the facial nerve.  This condition affects every person differently. Sometimes symptoms go away without treatment within a couple weeks.  If treatment is needed, it varies from person to person. The goal of treatment is to reduce inflammation and protect the eye from damage.  Contact your health care provider if your symptoms do not get better within 2-3 weeks, or your symptoms get worse. This information is not intended to replace advice given to you by your health care provider. Make sure you discuss any questions you have with your health care provider. Document Revised: 03/02/2017 Document Reviewed: 05/23/2016 Elsevier Patient Education  2020 Elsevier Inc. Gabapentin capsules or tablets What is this medicine? GABAPENTIN (GA ba pen tin) is used to control seizures in certain types of epilepsy. It is also used to treat certain types of nerve pain. This medicine may be used for other purposes; ask your health care provider or pharmacist if you have questions. COMMON BRAND NAME(S): Active-PAC with Gabapentin, Gabarone, Neurontin What should I tell my health care provider before I take this medicine? They need to know if you have any of these conditions:  history of drug abuse or alcohol abuse problem  kidney disease  lung or breathing disease  suicidal thoughts, plans, or attempt; a previous suicide attempt by you or a family member  an unusual or allergic reaction to gabapentin, other medicines, foods, dyes, or preservatives  pregnant or trying to get pregnant  breast-feeding How should I use this medicine? Take this medicine by mouth with a glass of water. Follow the directions on the prescription label. You can take it with or without food. If it upsets your stomach, take it with food. Take your medicine at regular intervals. Do not take it more often than directed. Do not stop taking except on your doctor's advice. If you are directed to break the 600 or 800 mg tablets in half as  part of your dose, the extra half tablet should be used for the next dose. If you have not used the extra half tablet within 28 days, it should be thrown away. A special MedGuide will be given to you by the pharmacist with each prescription and refill. Be sure to read this information carefully each time. Talk to your pediatrician regarding the use of this medicine in children. While this drug may be prescribed for children as young as 3 years for selected conditions, precautions do apply. Overdosage: If you think you have taken too much of this medicine contact a poison control center or emergency room at once. NOTE: This medicine is only for you. Do not share this medicine with others. What if I miss a dose? If you miss a dose, take it as soon as you can. If it is almost time for your next dose, take only that dose. Do not take double or extra doses. What may interact with this medicine? This medicine may interact with the following medications:  alcohol  antihistamines for allergy, cough, and cold  certain medicines for anxiety or sleep  certain medicines for depression like amitriptyline, fluoxetine, sertraline  certain medicines for seizures like phenobarbital, primidone  certain medicines for stomach problems  general anesthetics like halothane, isoflurane, methoxyflurane, propofol  local anesthetics like lidocaine, pramoxine, tetracaine  medicines that relax muscles for surgery  narcotic medicines for pain  phenothiazines like chlorpromazine, mesoridazine, prochlorperazine, thioridazine This list may not describe all possible interactions. Give your health care provider a list of all the medicines, herbs, non-prescription drugs, or dietary supplements you use. Also tell them if you smoke, drink alcohol, or use illegal drugs. Some items may interact with your medicine. What should I watch for while using this medicine? Visit your doctor or health care provider for regular  checks on your progress. You may want to keep a record at home of how you feel your condition is responding to treatment. You may want to share this information with your doctor or health care provider at each visit. You should contact your doctor or health care provider if your seizures get worse or if you have any new types of seizures. Do not stop taking this medicine or any of your seizure medicines unless instructed by your doctor or health care provider. Stopping your medicine suddenly can increase your seizures or their severity. This medicine may cause serious skin reactions. They can happen weeks to months after starting the medicine. Contact your health care provider right away if you notice fevers or flu-like symptoms with a rash. The rash may be red or purple and then turn into blisters or peeling of the skin. Or, you might notice a red rash with swelling of the face, lips or lymph nodes in your neck or under your arms. Wear a medical identification bracelet or chain if you are taking this medicine for seizures, and carry a card that lists all your medications. You may get drowsy, dizzy, or have blurred vision. Do not drive, use machinery, or do anything that needs mental alertness until you know how this medicine affects you. To reduce dizzy or fainting spells, do not sit or stand up quickly, especially if you are an older patient. Alcohol can increase drowsiness and dizziness. Avoid alcoholic drinks. Your mouth may get dry. Chewing sugarless gum or sucking hard candy, and drinking plenty of water will help. The use of this medicine may increase the chance of suicidal thoughts or actions. Pay special attention to how you are responding while on this medicine. Any worsening of mood, or thoughts of suicide or dying should be reported to your health care provider right away. Women who become pregnant while using this medicine may enroll in the [place] Antiepileptic Drug Pregnancy Registry by  calling [phone]. This registry collects information about the safety of antiepileptic drug use during pregnancy. What side effects may I notice from receiving this medicine? Side effects that you should report to your doctor or health care professional as soon as possible:  allergic reactions like skin rash, itching or hives, swelling of the face, lips, or tongue  breathing problems  rash, fever, and swollen lymph nodes  redness, blistering, peeling or loosening of the skin, including inside the mouth  suicidal thoughts, mood changes Side effects that usually do not require medical attention (report to your doctor or health care professional if they continue or are bothersome):  dizziness  drowsiness  headache  nausea, vomiting  swelling of ankles, feet, hands  tiredness This list may not describe all possible side effects. Call your doctor for medical advice about side effects. You may report side effects to FDA at  1-800-FDA-1088. Where should I keep my medicine? Keep out of reach of children. This medicine may cause accidental overdose and death if it taken by other adults, children, or pets. Mix any unused medicine with a substance like cat litter or coffee grounds. Then throw the medicine away in a sealed container like a sealed bag or a coffee can with a lid. Do not use the medicine after the expiration date. Store at room temperature between 15 and 30 degrees C (59 and 86 degrees F). NOTE: This sheet is a summary. It may not cover all possible information. If you have questions about this medicine, talk to your doctor, pharmacist, or health care provider.  2020 Elsevier/Gold Standard (2018-06-21 14:16:43)

## 2019-12-24 ENCOUNTER — Telehealth: Payer: Self-pay | Admitting: Neurology

## 2019-12-24 LAB — SJOGREN'S SYNDROME ANTIBODS(SSA + SSB)
ENA SSA (RO) Ab: <0.2 AI (ref 0.0–0.9)
ENA SSB (LA) Ab: <0.2 AI (ref 0.0–0.9)

## 2019-12-24 LAB — ANGIOTENSIN CONVERTING ENZYME: Angio Convert Enzyme: 19 U/L (ref 14–82)

## 2019-12-24 NOTE — Telephone Encounter (Signed)
[facility] healthy blue pending uploaded notes  °

## 2019-12-29 NOTE — Telephone Encounter (Signed)
m[facility] healthy blue Berkley Harvey: [id number] (exp. 12/24/19 to 01/23/20) order sent to GI. They will reach out to the patient to schedule.

## 2019-12-31 ENCOUNTER — Other Ambulatory Visit: Payer: Medicaid Other

## 2019-12-31 ENCOUNTER — Other Ambulatory Visit: Payer: Self-pay | Admitting: Nurse Practitioner

## 2019-12-31 DIAGNOSIS — E1165 Type 2 diabetes mellitus with hyperglycemia: Secondary | ICD-10-CM

## 2019-12-31 MED FILL — ATORVASTATIN CALCIUM 20 MG: 20 | 90 days supply | Qty: 90 | Fill #3

## 2019-12-31 MED FILL — LISINOPRIL 2.5 MG TABLET: 2.5 | 90 days supply | Qty: 90 | Fill #1

## 2019-12-31 MED FILL — metFORMIN HCL 1000 MG TABS: 1000 | 90 days supply | Qty: 180 | Fill #5

## 2020-01-16 ENCOUNTER — Other Ambulatory Visit: Payer: Self-pay

## 2020-01-16 ENCOUNTER — Ambulatory Visit: Payer: Medicaid Other | Attending: Nurse Practitioner | Admitting: Nurse Practitioner

## 2020-01-16 NOTE — Progress Notes (Signed)
ERRONEOUS ENCOUNTER

## 2020-01-26 ENCOUNTER — Other Ambulatory Visit: Payer: Medicaid Other

## 2020-01-26 ENCOUNTER — Ambulatory Visit
Admission: RE | Admit: 2020-01-26 | Discharge: 2020-01-26 | Disposition: A | Discharge status: Home / Self Care | Payer: Medicaid Other | Source: Ambulatory Visit | Attending: Neurology | Admitting: Neurology

## 2020-01-26 ENCOUNTER — Other Ambulatory Visit: Payer: Self-pay

## 2020-01-26 DIAGNOSIS — G510 Bell's palsy: Secondary | ICD-10-CM

## 2020-01-26 DIAGNOSIS — H9190 Unspecified hearing loss, unspecified ear: Secondary | ICD-10-CM

## 2020-01-26 DIAGNOSIS — H9202 Otalgia, left ear: Secondary | ICD-10-CM

## 2020-01-26 DIAGNOSIS — R519 Headache, unspecified: Secondary | ICD-10-CM

## 2020-01-26 MED ORDER — GADOBENATE DIMEGLUMINE 529 MG/ML IV SOLN
20.0000 mL | Freq: Once | INTRAVENOUS | Status: AC | PRN
  Administered 2020-01-26: 20 mL via INTRAVENOUS

## 2020-02-10 ENCOUNTER — Other Ambulatory Visit: Payer: Self-pay | Admitting: Nurse Practitioner

## 2020-02-10 DIAGNOSIS — E1165 Type 2 diabetes mellitus with hyperglycemia: Secondary | ICD-10-CM

## 2020-02-10 MED FILL — ACCU-CHEK GUIDE TEST STRIP: 34 days supply | Qty: 100 | Fill #4

## 2020-02-10 MED FILL — glipiZIDE XL 5 MG TB24: 5 | 90 days supply | Qty: 90 | Fill #0

## 2020-03-19 ENCOUNTER — Other Ambulatory Visit: Payer: Self-pay

## 2020-03-19 ENCOUNTER — Encounter: Payer: Self-pay | Admitting: Nurse Practitioner

## 2020-03-19 ENCOUNTER — Other Ambulatory Visit: Payer: Self-pay | Admitting: Nurse Practitioner

## 2020-03-19 ENCOUNTER — Ambulatory Visit: Payer: Medicaid Other | Attending: Nurse Practitioner | Admitting: Nurse Practitioner

## 2020-03-19 VITALS — BP 117/75 | HR 101 | Temp 99.0°F | Ht 63.0 in | Wt 285.2 lb

## 2020-03-19 DIAGNOSIS — E1165 Type 2 diabetes mellitus with hyperglycemia: Secondary | ICD-10-CM

## 2020-03-19 DIAGNOSIS — Z6841 Body Mass Index (BMI) 40.0 and over, adult: Secondary | ICD-10-CM | POA: Diagnosis not present

## 2020-03-19 DIAGNOSIS — N939 Abnormal uterine and vaginal bleeding, unspecified: Secondary | ICD-10-CM

## 2020-03-19 LAB — GLUCOSE, POCT (MANUAL RESULT ENTRY): POC Glucose: 193 mg/dl — AB (ref 70–99)

## 2020-03-19 LAB — POCT GLYCOSYLATED HEMOGLOBIN (HGB A1C): Hemoglobin A1C: 9.2 % — AB (ref 4.0–5.6)

## 2020-03-19 MED ORDER — LISINOPRIL 2.5 MG PO TABS: 2.5000 mg | ORAL_TABLET | Freq: Every day | ORAL | 0 refills | Status: DC

## 2020-03-19 MED ORDER — BD PEN NEEDLE MINI U/F 31G X 5 MM MISC: 3 refills | Status: DC

## 2020-03-19 MED ORDER — TRULICITY 0.75 MG/0.5ML ~~LOC~~ SOAJ: 0.7500 mg | SUBCUTANEOUS | 0 refills | Status: DC

## 2020-03-19 MED FILL — TRULICITY 0.75 MG/0.5 ML PE: 0.75 | 28 days supply | Qty: 2 | Fill #0

## 2020-03-19 MED FILL — BD PEN NDL MINI 31GX5MM: 31G X 5 MM | 25 days supply | Qty: 100 | Fill #0

## 2020-03-19 MED FILL — LISINOPRIL 2.5 MG TABLET: 2.5 | 90 days supply | Qty: 90 | Fill #0

## 2020-03-19 NOTE — Progress Notes (Signed)
Assessment & Plan:  Norma Chambers was seen today for diabetes.  Diagnoses and all orders for this visit:  Type 2 diabetes mellitus with hyperglycemia, without long-term current use of insulin (HCC) -     Glucose (CBG) -     HgB A1c -     Microalbumin/Creatinine Ratio, Urine -     Dulaglutide (TRULICITY) 2.35 TI/1.4ER SOPN; Inject 0.75 mg into the skin once a week. -     Insulin Pen Needle (B-D UF III MINI PEN NEEDLES) 31G X 5 MM MISC; Use as instructed. Inject into the skin once weekly -     CMP14+EGFR -     Lipid panel -     lisinopril (ZESTRIL) 2.5 MG tablet; Take 1 tablet (2.5 mg total) by mouth daily with lunch. Continue blood sugar control as discussed in office today, low carbohydrate diet, and regular physical exercise as tolerated, 150 minutes per week (30 min each day, 5 days per week, or 50 min 3 days per week). Keep blood sugar logs with fasting goal of 90-130 mg/dl, post prandial (after you eat) less than 180.  For Hypoglycemia: BS <60 and Hyperglycemia BS >400; contact the clinic ASAP. Annual eye exams and foot exams are recommended.   Morbid obesity with body mass index of 50.0-59.9 in adult (HCC) -     Dulaglutide (TRULICITY) 1.54 MG/8.6PY SOPN; Inject 0.75 mg into the skin once a week. -     Insulin Pen Needle (B-D UF III MINI PEN NEEDLES) 31G X 5 MM MISC; Use as instructed. Inject into the skin once weekly -     Lipid panel Discussed diet and exercise for person with BMI >25. Instructed: You must burn more calories than you eat. Losing 5 percent of your body weight should be considered a success. In the longer term, losing more than 15 percent of your body weight and staying at this weight is an extremely good result. However, keep in mind that even losing 5 percent of your body weight leads to important health benefits, so try not to get discouraged if you're not able to lose more than this. Will recheck weight in 3-6 months.  Abnormal uterine bleeding (AUB) -     Beta hCG  quant (ref lab)    Patient has been counseled on age-appropriate routine health concerns for screening and prevention. These are reviewed and up-to-date. Referrals have been placed accordingly. Immunizations are up-to-date or declined.    Subjective:   Chief Complaint  Patient presents with   Diabetes    Pt. Is here for diabetes follow up.    HPI Norma Chambers 49 y.o. female presents to office today for DM2.   has a past medical history of Gestational diabetes, Obesity, and Type 2 diabetes mellitus with hyperglycemia, without long-term current use of insulin ([place]).   AUB She had a menstrual cycle 1 November however has not had a menstrual cycle for the month of December.  She has taken 2 home urine pregnancy tests which have been negative.   DM2 She is monitoring her blood glucose levels at home with average fasting readings 180-200s.  Currently taking Metformin 1000 mg twice daily and glipizide XL 5 mg daily.  BMI is 50.52.  At this time she is agreeable to starting Trulicity 1.95 mg weekly.  LDL is not at goal.  She does endorse medication adherence taking Lipitor 20 mg daily Lab Results  Component Value Date   HGBA1C 9.2 (A) 03/19/2020   Lab Results  Component Value Date   HGBA1C 9.2 (A) 03/19/2020   Lab Results  Component Value Date   LDLCALC 124 (H) 04/15/2019    Review of Systems  Constitutional: Negative for fever, malaise/fatigue and weight loss.  HENT: Negative.  Negative for nosebleeds.   Eyes: Negative.  Negative for blurred vision, double vision and photophobia.  Respiratory: Negative.  Negative for cough and shortness of breath.   Cardiovascular: Negative.  Negative for chest pain, palpitations and leg swelling.  Gastrointestinal: Negative.  Negative for heartburn, nausea and vomiting.  Musculoskeletal: Negative.  Negative for myalgias.  Neurological: Negative.  Negative for dizziness, focal weakness, seizures and headaches.  Psychiatric/Behavioral: Negative.   Negative for suicidal ideas.    Past Medical History:  Diagnosis Date   Gestational diabetes    Obesity    Type 2 diabetes mellitus with hyperglycemia, without long-term current use of insulin ([place])     Past Surgical History:  Procedure Laterality Date   CESAREAN SECTION     CESAREAN SECTION N/A 11/18/2012   Procedure: REPEAT CESAREAN SECTION;  Surgeon: Mora Bellman, MD;  Location: [facility];  Service: Obstetrics;  Laterality: N/A;    Family History  Problem Relation Age of Onset   Diabetes Mother    Diabetes Maternal Grandmother    Bell's palsy Neg Hx     Social History Reviewed with no changes to be made today.   Outpatient Medications Prior to Visit  Medication Sig Dispense Refill   atorvastatin (LIPITOR) 20 MG tablet Take 1 tablet (20 mg total) by mouth daily. (Patient taking differently: Take 20 mg by mouth daily with lunch.) 90 tablet 3   Blood Glucose Monitoring Suppl (ACCU-CHEK GUIDE ME) w/Device KIT 1 kit by Does not apply route 2 (two) times daily. Use as instructed. Check blood glucose level by fingerstick twice per day. E11.65 1 kit 0   gabapentin (NEURONTIN) 100 MG capsule Take 1-2 capsules (100-200 mg total) by mouth 3 (three) times daily as needed. 180 capsule 1   glipiZIDE (GLIPIZIDE XL) 5 MG 24 hr tablet TAKE 1 TABLET (5 MG TOTAL) BY MOUTH DAILY WITH BREAKFAST. 90 tablet 0   glucose blood (ACCU-CHEK GUIDE) test strip Use as instructed. Check blood glucose level by fingerstick twice per day. E11.65 100 each 11   Lancets (ACCU-CHEK SOFT TOUCH) lancets Use as instructed 100 each 12   metFORMIN (GLUCOPHAGE) 1000 MG tablet Take 1 tablet (1,000 mg total) by mouth 2 (two) times daily with a meal. 180 tablet 3   amoxicillin (AMOXIL) 500 MG capsule Take 1 capsule (500 mg total) by mouth 2 (two) times daily. 14 capsule 0   valACYclovir (VALTREX) 1000 MG tablet Take 1 tablet (1,000 mg total) by mouth 3 (three) times daily. (Patient not taking: Reported on  03/19/2020) 21 tablet 0   lisinopril (ZESTRIL) 2.5 MG tablet Take 1 tablet (2.5 mg total) by mouth daily. (Patient taking differently: Take 2.5 mg by mouth daily with lunch. ) 90 tablet 1   No facility-administered medications prior to visit.    No Known Allergies     Objective:    BP 117/75 (BP Location: Left Arm, Patient Position: Sitting, Cuff Size: Large)    Pulse (!) 101    Temp 99 F (37.2 C) (Oral)    Ht 5' 3" (1.6 m)    Wt 285 lb 3.2 oz (129.4 kg)    LMP 02/05/2020    SpO2 96%    BMI 50.52 kg/m  Wt Readings from Last 3  Encounters:  03/19/20 285 lb 3.2 oz (129.4 kg)  12/23/19 287 lb (130.2 kg)  12/19/19 285 lb (129.3 kg)    Physical Exam Vitals and nursing note reviewed.  Constitutional:      Appearance: She is well-developed and well-nourished.  HENT:     Head: Normocephalic and atraumatic.  Eyes:     Extraocular Movements: EOM normal.  Cardiovascular:     Rate and Rhythm: Regular rhythm. Tachycardia present.     Pulses: Intact distal pulses.     Heart sounds: Normal heart sounds. No murmur heard. No friction rub. No gallop.   Pulmonary:     Effort: Pulmonary effort is normal. No tachypnea or respiratory distress.     Breath sounds: Normal breath sounds. No decreased breath sounds, wheezing, rhonchi or rales.  Chest:     Chest wall: No tenderness.  Abdominal:     General: Bowel sounds are normal.     Palpations: Abdomen is soft.  Musculoskeletal:        General: No edema. Normal range of motion.     Cervical back: Normal range of motion.  Skin:    General: Skin is warm and dry.  Neurological:     Mental Status: She is alert and oriented to person, place, and time.     Coordination: Coordination normal.  Psychiatric:        Mood and Affect: Mood and affect normal.        Behavior: Behavior normal. Behavior is cooperative.        Thought Content: Thought content normal.        Judgment: Judgment normal.          Patient has been counseled extensively  about nutrition and exercise as well as the importance of adherence with medications and regular follow-up. The patient was given clear instructions to go to ER or return to medical center if symptoms don't improve, worsen or new problems develop. The patient verbalized understanding.   Follow-up: Return in about 4 weeks (around 04/16/2020) for [place] .   Gildardo Pounds, FNP-BC [facility] and [facility] [place], [place]   03/19/2020, 11:05 AM

## 2020-03-20 LAB — MICROALBUMIN / CREATININE URINE RATIO
Creatinine, Urine: 106.2 mg/dL
Microalb/Creat Ratio: 12 mg/g creat (ref 0–29)
Microalbumin, Urine: 12.6 ug/mL

## 2020-03-20 LAB — CMP14+EGFR
ALT: 51 IU/L — ABNORMAL HIGH (ref 0–32)
AST: 59 IU/L — ABNORMAL HIGH (ref 0–40)
Albumin/Globulin Ratio: 1.8 (ref 1.2–2.2)
Albumin: 4.4 g/dL (ref 3.8–4.8)
Alkaline Phosphatase: 92 IU/L (ref 44–121)
BUN/Creatinine Ratio: 17 (ref 9–23)
BUN: 10 mg/dL (ref 6–24)
Bilirubin Total: 0.3 mg/dL (ref 0.0–1.2)
CO2: 23 mmol/L (ref 20–29)
Calcium: 9.9 mg/dL (ref 8.7–10.2)
Chloride: 93 mmol/L — ABNORMAL LOW (ref 96–106)
Creatinine, Ser: 0.58 mg/dL (ref 0.57–1.00)
GFR calc Af Amer: 128 mL/min/{1.73_m2} (ref >59)
GFR calc non Af Amer: 111 mL/min/{1.73_m2} (ref >59)
Globulin, Total: 2.4 g/dL (ref 1.5–4.5)
Glucose: 159 mg/dL — ABNORMAL HIGH (ref 65–99)
Potassium: 4.2 mmol/L (ref 3.5–5.2)
Sodium: 135 mmol/L (ref 134–144)
Total Protein: 6.8 g/dL (ref 6.0–8.5)

## 2020-03-20 LAB — BETA HCG QUANT (REF LAB): hCG Quant: <1 m[IU]/mL

## 2020-03-20 LAB — LIPID PANEL
Chol/HDL Ratio: 3 ratio (ref 0.0–4.4)
Cholesterol, Total: 106 mg/dL (ref 100–199)
HDL: 35 mg/dL — ABNORMAL LOW (ref >39)
LDL Chol Calc (NIH): 40 mg/dL (ref 0–99)
Triglycerides: 192 mg/dL — ABNORMAL HIGH (ref 0–149)
VLDL Cholesterol Cal: 31 mg/dL (ref 5–40)

## 2020-03-30 ENCOUNTER — Other Ambulatory Visit: Payer: Self-pay | Admitting: Nurse Practitioner

## 2020-03-30 DIAGNOSIS — E119 Type 2 diabetes mellitus without complications: Secondary | ICD-10-CM

## 2020-03-30 MED FILL — METFORMIN HCL 1000 MG TABS: 1000 | 90 days supply | Qty: 180 | Fill #0

## 2020-03-30 MED FILL — ACCU-CHEK GUIDE TEST STRIP: 34 days supply | Qty: 100 | Fill #5

## 2020-04-09 ENCOUNTER — Other Ambulatory Visit: Payer: Self-pay | Admitting: Family Medicine

## 2020-04-09 ENCOUNTER — Other Ambulatory Visit: Payer: Self-pay | Admitting: Nurse Practitioner

## 2020-04-09 ENCOUNTER — Other Ambulatory Visit: Payer: Self-pay | Admitting: Pharmacist

## 2020-04-09 DIAGNOSIS — E1165 Type 2 diabetes mellitus with hyperglycemia: Secondary | ICD-10-CM

## 2020-04-09 DIAGNOSIS — Z6841 Body Mass Index (BMI) 40.0 and over, adult: Secondary | ICD-10-CM

## 2020-04-09 MED ORDER — TRULICITY 0.75 MG/0.5ML ~~LOC~~ SOAJ: 0.7500 mg | SUBCUTANEOUS | 0 refills | Status: DC

## 2020-04-09 MED FILL — TRULICITY 0.75 MG/0.5 ML PE: 0.75 | 28 days supply | Qty: 2 | Fill #0

## 2020-04-26 ENCOUNTER — Other Ambulatory Visit: Payer: Self-pay

## 2020-04-26 ENCOUNTER — Encounter: Payer: Self-pay | Admitting: Nurse Practitioner

## 2020-04-26 ENCOUNTER — Other Ambulatory Visit: Payer: Self-pay | Admitting: Nurse Practitioner

## 2020-04-26 ENCOUNTER — Ambulatory Visit: Payer: Medicaid Other | Attending: Nurse Practitioner | Admitting: Nurse Practitioner

## 2020-04-26 DIAGNOSIS — E785 Hyperlipidemia, unspecified: Secondary | ICD-10-CM

## 2020-04-26 DIAGNOSIS — E1165 Type 2 diabetes mellitus with hyperglycemia: Secondary | ICD-10-CM | POA: Diagnosis not present

## 2020-04-26 DIAGNOSIS — J069 Acute upper respiratory infection, unspecified: Secondary | ICD-10-CM | POA: Diagnosis not present

## 2020-04-26 MED ORDER — DIABETIC TUSSIN COUGH DROPS 6 MG MT LOZG: LOZENGE | OROMUCOSAL | 1 refills | Status: DC

## 2020-04-26 MED ORDER — TRULICITY 1.5 MG/0.5ML ~~LOC~~ SOAJ
1.5000 mg | SUBCUTANEOUS | 1 refills | Status: DC
Start: 2020-04-26 — End: 2020-04-26

## 2020-04-26 MED ORDER — TRULICITY 1.5 MG/0.5ML ~~LOC~~ SOAJ: 1.5000 mg | SUBCUTANEOUS | 1 refills | Status: DC

## 2020-04-26 MED ORDER — DEXTROMETHORPHAN HBR 15 MG/5ML PO LIQD: 5.0000 mL | Freq: Four times a day (QID) | ORAL | 0 refills | Status: DC | PRN

## 2020-04-26 MED ORDER — TRULICITY 1.5 MG/0.5ML ~~LOC~~ SOAJ: 1.5000 mg | SUBCUTANEOUS | 1 refills | Status: AC

## 2020-04-26 MED FILL — TRULICITY 1.5 MG/0.5 ML PEN: 1.5 | 28 days supply | Qty: 2 | Fill #0

## 2020-04-26 NOTE — Progress Notes (Signed)
Virtual Visit via Telephone Note Due to national recommendations of social distancing due to COVID 19, telehealth visit is felt to be most appropriate for this patient at this time.  I discussed the limitations, risks, security and privacy concerns of performing an evaluation and management service by telephone and the availability of in person appointments. I also discussed with the patient that there may be a patient responsible charge related to this service. The patient expressed understanding and agreed to proceed.    I connected with Norma Chambers on 04/26/20  at   9:30 AM EST  EDT by telephone and verified that I am speaking with the correct person using two identifiers.   Consent I discussed the limitations, risks, security and privacy concerns of performing an evaluation and management service by telephone and the availability of in person appointments. I also discussed with the patient that there may be a patient responsible charge related to this service. The patient expressed understanding and agreed to proceed.   Location of Patient: Private Residence   [facility] of Provider: [facility]    Persons participating in Telemedicine visit: Norma Chambers Norma Chambers    History of Present Illness: Telemedicine visit for: Follow up She has a past medical history of Obesity, and Type 2 diabetes mellitus with hyperglycemia, without long-term current use of insulin (HCC).   DM 2 She has not been feeling well over the past week. Believes she had the flu. Still with persistent dry cough but no fever. She has not been vaccinated for COVID but states her COVID test was negative. Fasting blood glucose reading this morning 152. Post prandial last night 167. Will increase Trulicity to 1.5 weekly from .75 weekly. She will continue on glipizide 5 mg daily and metformin 1000 mg BID. She takes gabapentin as needed for chronic symptoms of Bell's  Palsy. LDL at goal with taking atorvastatin 20 mg daily. She is also on renal dose ACE.  Lab Results  Component Value Date   HGBA1C 9.2 (A) 03/19/2020   Lab Results  Component Value Date   LDLCALC 40 03/19/2020     Past Medical History:  Diagnosis Date  . Gestational diabetes   . Obesity   . Type 2 diabetes mellitus with hyperglycemia, without long-term current use of insulin [facility])     Past Surgical History:  Procedure Laterality Date  . CESAREAN SECTION    . CESAREAN SECTION N/A 11/18/2012   Procedure: REPEAT CESAREAN SECTION;  Surgeon: Catalina Antigua, MD;  Location: [facility];  Service: Obstetrics;  Laterality: N/A;    Family History  Problem Relation Age of Onset  . Diabetes Mother   . Diabetes Maternal Grandmother   . Bell's palsy Neg Hx     Social History   Socioeconomic History  . Marital status: Married    Spouse name: Not on file  . Number of children: Not on file  . Years of education: Not on file  . Highest education level: Not on file  Occupational History  . Not on file  Tobacco Use  . Smoking status: Never Smoker  . Smokeless tobacco: Never Used  Vaping Use  . Vaping Use: Never used  Substance and Sexual Activity  . Alcohol use: No  . Drug use: No  . Sexual activity: Not Currently  Other Topics Concern  . Not on file  Social History Narrative   Lives at home with husband and 3 children   Right handed  Caffeine: 1 cup/day   Social Determinants of Health   Financial Resource Strain: Not on file  Food Insecurity: Not on file  Transportation Needs: Not on file  Physical Activity: Not on file  Stress: Not on file  Social Connections: Not on file     Observations/Objective: Awake, alert and oriented x 3   Review of Systems  Constitutional: Negative for fever, malaise/fatigue and weight loss.  HENT: Negative.  Negative for nosebleeds.   Eyes: Negative.  Negative for blurred vision, double vision and photophobia.  Respiratory: Positive for  cough. Negative for shortness of breath.   Cardiovascular: Negative.  Negative for chest pain, palpitations and leg swelling.  Gastrointestinal: Negative.  Negative for heartburn, nausea and vomiting.  Musculoskeletal: Negative.  Negative for myalgias.  Neurological: Positive for tingling and sensory change. Negative for dizziness, focal weakness, seizures and headaches.  Psychiatric/Behavioral: Negative.  Negative for suicidal ideas.    Assessment and Plan: Norma Chambers was seen today for meter check.  Diagnoses and all orders for this visit:  Type 2 diabetes mellitus with hyperglycemia, without long-term current use of insulin (HCC) -  Dulaglutide (TRULICITY) 1.5 MG/0.5ML SOPN; Inject 1.5 mg into the skin once a week. Continue blood sugar control as discussed in office today, low carbohydrate diet, and regular physical exercise as tolerated, 150 minutes per week (30 min each day, 5 days per week, or 50 min 3 days per week). Keep blood sugar logs with fasting goal of 90-130 mg/dl, post prandial (after you eat) less than 180.  For Hypoglycemia: BS <60 and Hyperglycemia BS >400; contact the clinic ASAP. Annual eye exams and foot exams are recommended.   Viral upper respiratory illness -     Throat Lozenges (DIABETIC TUSSIN COUGH DROPS) 6 MG LOZG; Take one cough drop as needed for cough -     Dextromethorphan HBr 15 MG/5ML LIQD; Take 5 mLs (15 mg total) by mouth 4 (four) times daily as needed.     Follow Up Instructions Return in about 3 months (around 07/25/2020).     I discussed the assessment and treatment plan with the patient. The patient was provided an opportunity to ask questions and all were answered. The patient agreed with the plan and demonstrated an understanding of the instructions.   The patient was advised to call back or seek an in-person evaluation if the symptoms worsen or if the condition fails to improve as anticipated.  I provided 13 minutes of non-face-to-face time  during this encounter including median intraservice time, reviewing previous notes, labs, imaging, medications and explaining diagnosis and management.  Claiborne Rigg, Chambers

## 2020-05-18 ENCOUNTER — Other Ambulatory Visit: Payer: Self-pay | Admitting: Nurse Practitioner

## 2020-05-18 ENCOUNTER — Other Ambulatory Visit: Payer: Self-pay | Admitting: Family Medicine

## 2020-05-18 DIAGNOSIS — E1165 Type 2 diabetes mellitus with hyperglycemia: Secondary | ICD-10-CM

## 2020-05-18 DIAGNOSIS — E119 Type 2 diabetes mellitus without complications: Secondary | ICD-10-CM

## 2020-05-18 DIAGNOSIS — E785 Hyperlipidemia, unspecified: Secondary | ICD-10-CM

## 2020-05-18 MED FILL — TRULICITY 1.5 MG/0.5 ML PEN: 1.5 | 28 days supply | Qty: 2 | Fill #1

## 2020-05-18 MED FILL — ACCU-CHEK GUIDE TEST STRIP: 34 days supply | Qty: 100 | Fill #0

## 2020-05-18 MED FILL — glipiZIDE XL 5 MG TB24: 5 | 90 days supply | Qty: 90 | Fill #0

## 2020-05-18 MED FILL — ATORVASTATIN CALCIUM 20 MG: 20 | 90 days supply | Qty: 90 | Fill #0

## 2020-06-16 ENCOUNTER — Other Ambulatory Visit: Payer: Self-pay | Admitting: Nurse Practitioner

## 2020-06-16 DIAGNOSIS — E119 Type 2 diabetes mellitus without complications: Secondary | ICD-10-CM

## 2020-06-16 NOTE — Telephone Encounter (Signed)
Requested Prescriptions  Pending Prescriptions Disp Refills  . metFORMIN (GLUCOPHAGE) 1000 MG tablet [Pharmacy Med Name: METFORMIN HCL 1000 MG TABS 1000 Tablet] 180 tablet 0    Sig: TAKE 1 TABLET (1,000 MG TOTAL) BY MOUTH 2 (TWO) TIMES DAILY WITH A MEAL.     Endocrinology:  Diabetes - Biguanides Failed - 06/16/2020  1:43 PM      Failed - HBA1C is between 0 and 7.9 and within 180 days    Hemoglobin A1C  Date Value Ref Range Status  03/19/2020 9.2 (A) 4.0 - 5.6 % Final   HbA1c, POC (controlled diabetic range)  Date Value Ref Range Status  12/19/2019 9.1 (A) 0.0 - 7.0 % Final         Passed - Cr in normal range and within 360 days    Creat  Date Value Ref Range Status  07/09/2015 0.43 (L) 0.50 - 1.10 mg/dL Final   Creatinine, Ser  Date Value Ref Range Status  03/19/2020 0.58 0.57 - 1.00 mg/dL Final   Creatinine, POC  Date Value Ref Range Status  05/21/2017 300 mg/dL Final   Creatinine, Urine  Date Value Ref Range Status  03/06/2016 37 20 - 320 mg/dL Final         Passed - eGFR in normal range and within 360 days    GFR, Est African American  Date Value Ref Range Status  07/09/2015 >89 >=60 mL/min Final   GFR calc Af Amer  Date Value Ref Range Status  03/19/2020 128 >59 mL/min/1.73 Final    Comment:    **In accordance with recommendations from the NKF-ASN Task force,**   [facility] is in the process of updating its eGFR calculation to the   2021 CKD-EPI creatinine equation that estimates kidney function   without a race variable.    GFR, Est Non African American  Date Value Ref Range Status  07/09/2015 >89 >=60 mL/min Final    Comment:      The estimated GFR is a calculation valid for adults (>=92 years old) that uses the CKD-EPI algorithm to adjust for age and sex. It is   not to be used for children, pregnant women, hospitalized patients,    patients on dialysis, or with rapidly changing kidney function. According to the NKDEP, eGFR >89 is normal, 60-89 shows  mild impairment, 30-59 shows moderate impairment, 15-29 shows severe impairment and <15 is ESRD.      GFR calc non Af Amer  Date Value Ref Range Status  03/19/2020 111 >59 mL/min/1.73 Final         Passed - Valid encounter within last 6 months    Recent Outpatient Visits          1 month ago Type 2 diabetes mellitus with hyperglycemia, without long-term current use of insulin [facility])   [place], [place] W, NP   2 months ago Type 2 diabetes mellitus with hyperglycemia, without long-term current use of insulin [facility])   [place], [place] W, NP   6 months ago Type 2 diabetes mellitus with hyperglycemia, without long-term current use of insulin ([place])   [place] Fulp, [place], MD   11 months ago Type 2 diabetes mellitus with hyperglycemia, without long-term current use of insulin [facility])   [place], [place] W, NP   1 year ago Type 2 diabetes mellitus without complication, without long-term current use of insulin ([place])  [place] Gildardo Pounds, NP      Future Appointments            In 2 days Gildardo Pounds, NP [place]

## 2020-06-18 ENCOUNTER — Ambulatory Visit: Payer: Medicaid Other | Admitting: Nurse Practitioner

## 2020-06-21 ENCOUNTER — Ambulatory Visit: Payer: Medicaid Other | Attending: Nurse Practitioner | Admitting: Nurse Practitioner

## 2020-06-21 ENCOUNTER — Encounter: Payer: Self-pay | Admitting: Nurse Practitioner

## 2020-06-21 ENCOUNTER — Other Ambulatory Visit: Payer: Self-pay

## 2020-06-21 DIAGNOSIS — E1165 Type 2 diabetes mellitus with hyperglycemia: Secondary | ICD-10-CM | POA: Diagnosis not present

## 2020-06-21 DIAGNOSIS — D72829 Elevated white blood cell count, unspecified: Secondary | ICD-10-CM | POA: Diagnosis not present

## 2020-06-21 DIAGNOSIS — Z1211 Encounter for screening for malignant neoplasm of colon: Secondary | ICD-10-CM | POA: Diagnosis not present

## 2020-06-21 DIAGNOSIS — Z1159 Encounter for screening for other viral diseases: Secondary | ICD-10-CM | POA: Diagnosis not present

## 2020-06-21 NOTE — Progress Notes (Signed)
Virtual Visit via Telephone Note Due to national recommendations of social distancing due to [employer] 19, telehealth visit is felt to be most appropriate for this patient at this time.  I discussed the limitations, risks, security and privacy concerns of performing an evaluation and management service by telephone and the availability of in person appointments. I also discussed with the patient that there may be a patient responsible charge related to this service. The patient expressed understanding and agreed to proceed.    I connected with Shenetta Fazzini on 06/21/20  at  10:30 AM EDT  EDT by telephone and verified that I am speaking with the correct person using two identifiers.   Consent I discussed the limitations, risks, security and privacy concerns of performing an evaluation and management service by telephone and the availability of in person appointments. I also discussed with the patient that there may be a patient responsible charge related to this service. The patient expressed understanding and agreed to proceed.   Location of Patient: Private  Residence   Location of Provider: [place] [facility] [place] participating in Telemedicine visit: Geryl Rankins FNP-BC [place]    History of Present Illness: Telemedicine visit for: Norma Chambers She needs an office visit. Unfortunately her Daughter accidentally canceled her appt. [employer].   DM2 Poorly controlled.  She endorses medication adherence taking glipizide XL 5 mg daily, Trulicity 1.5 mg weekly and Metformin 1000 mg twice daily.  LDL at goal with atorvastatin 20 mg daily and she is also taking a renal dose ACE.  Blood pressure is well controlled. Lab Results  Component Value Date   HGBA1C 9.2 (A) 03/19/2020   Lab Results  Component Value Date   LDLCALC 40 03/19/2020   BP Readings from Last 3 Encounters:  03/19/20 117/75  12/23/19 133/81  12/19/19 130/82    Past Medical History:   Diagnosis Date  . Gestational diabetes   . Obesity   . Type 2 diabetes mellitus with hyperglycemia, without long-term current use of insulin [facility])     Past Surgical History:  Procedure Laterality Date  . CESAREAN SECTION    . CESAREAN SECTION N/A 11/18/2012   Procedure: REPEAT CESAREAN SECTION;  Surgeon: Mora Bellman, MD;  Location: [facility];  Service: Obstetrics;  Laterality: N/A;    Family History  Problem Relation Age of Onset  . Diabetes Mother   . Diabetes Maternal Grandmother   . Bell's palsy Neg Hx     Social History   Socioeconomic History  . Marital status: Married    Spouse name: Not on file  . Number of children: Not on file  . Years of education: Not on file  . Highest education level: Not on file  Occupational History  . Not on file  Tobacco Use  . Smoking status: Never Smoker  . Smokeless tobacco: Never Used  Vaping Use  . Vaping Use: Never used  Substance and Sexual Activity  . Alcohol use: No  . Drug use: No  . Sexual activity: Not Currently  Other Topics Concern  . Not on file  Social History Narrative   Lives at home with husband and 3 children   Right handed   Caffeine: 1 cup/day   Social Determinants of Health   Financial Resource Strain: Not on file  Food Insecurity: Not on file  Transportation Needs: Not on file  Physical Activity: Not on file  Stress: Not on file  Social Connections: Not on  file     Observations/Objective: Awake, alert and oriented x 3   Review of Systems  Constitutional: Negative for fever, malaise/fatigue and weight loss.  HENT: Negative.  Negative for nosebleeds.   Eyes: Negative.  Negative for blurred vision, double vision and photophobia.  Respiratory: Negative.  Negative for cough and shortness of breath.   Cardiovascular: Negative.  Negative for chest pain, palpitations and leg swelling.  Gastrointestinal: Negative.  Negative for heartburn, nausea and vomiting.  Musculoskeletal: Negative.  Negative for  myalgias.  Neurological: Negative.  Negative for dizziness, focal weakness, seizures and headaches.  Psychiatric/Behavioral: Negative.  Negative for suicidal ideas.    Assessment and Plan: Anjulie was seen today for diabetes.  Diagnoses and all orders for this visit:  Type 2 diabetes mellitus with hyperglycemia, without long-term current use of insulin ([place]) -     Ambulatory referral to Ophthalmology -     Cancel: Hemoglobin A1c; Future -     Cancel: CMP14+EGFR; Future -     Hemoglobin A1c; Future -     Basic metabolic panel; Future Continue blood sugar control as discussed in office today, low carbohydrate diet, and regular physical exercise as tolerated, 150 minutes per week (30 min each day, 5 days per week, or 50 min 3 days per week). Keep blood sugar logs with fasting goal of 90-130 mg/dl, post prandial (after you eat) less than 180.  For Hypoglycemia: BS <60 and Hyperglycemia BS >400; contact the clinic ASAP. Annual eye exams and foot exams are recommended.   Need for hepatitis C screening test -     HCV Ab w Reflex to Quant PCR; Future  Colon cancer screening -     Ambulatory referral to Gastroenterology  Leukocytosis, unspecified type -     CBC; Future     Follow Up Instructions Return in about 3 months (around 09/21/2020).     I discussed the assessment and treatment plan with the patient. The patient was provided an opportunity to ask questions and all were answered. The patient agreed with the plan and demonstrated an understanding of the instructions.   The patient was advised to call back or seek an in-person evaluation if the symptoms worsen or if the condition fails to improve as anticipated.  I provided 12 minutes of non-face-to-face time during this encounter including median intraservice time, reviewing previous notes, labs, imaging, medications and explaining diagnosis and management.  Gildardo Pounds, FNP-BC

## 2020-06-22 ENCOUNTER — Ambulatory Visit: Payer: Medicaid Other | Attending: Nurse Practitioner

## 2020-06-22 ENCOUNTER — Other Ambulatory Visit: Payer: Self-pay

## 2020-06-22 DIAGNOSIS — Z1159 Encounter for screening for other viral diseases: Secondary | ICD-10-CM

## 2020-06-22 DIAGNOSIS — D72829 Elevated white blood cell count, unspecified: Secondary | ICD-10-CM

## 2020-06-22 DIAGNOSIS — E1165 Type 2 diabetes mellitus with hyperglycemia: Secondary | ICD-10-CM | POA: Diagnosis not present

## 2020-06-23 LAB — CBC
Hematocrit: 42.2 % (ref 34.0–46.6)
Hemoglobin: 13.6 g/dL (ref 11.1–15.9)
MCH: 28.5 pg (ref 26.6–33.0)
MCHC: 32.2 g/dL (ref 31.5–35.7)
MCV: 89 fL (ref 79–97)
Platelets: 361 10*3/uL (ref 150–450)
RBC: 4.77 x10E6/uL (ref 3.77–5.28)
RDW: 13.4 % (ref 11.7–15.4)
WBC: 10.7 10*3/uL (ref 3.4–10.8)

## 2020-06-23 LAB — HCV AB W REFLEX TO QUANT PCR: HCV Ab: <0.1 s/co ratio (ref 0.0–0.9)

## 2020-06-23 LAB — BASIC METABOLIC PANEL
BUN/Creatinine Ratio: 11 (ref 9–23)
BUN: 6 mg/dL (ref 6–24)
CO2: 21 mmol/L (ref 20–29)
Calcium: 9.2 mg/dL (ref 8.7–10.2)
Chloride: 99 mmol/L (ref 96–106)
Creatinine, Ser: 0.54 mg/dL — ABNORMAL LOW (ref 0.57–1.00)
Glucose: 131 mg/dL — ABNORMAL HIGH (ref 65–99)
Potassium: 4.7 mmol/L (ref 3.5–5.2)
Sodium: 141 mmol/L (ref 134–144)
eGFR: 114 mL/min/{1.73_m2} (ref >59)

## 2020-06-23 LAB — HEMOGLOBIN A1C
Est. average glucose Bld gHb Est-mCnc: 183 mg/dL
Hgb A1c MFr Bld: 8 % — ABNORMAL HIGH (ref 4.8–5.6)

## 2020-06-23 LAB — HCV INTERPRETATION

## 2020-07-22 MED FILL — Dulaglutide Soln Auto-injector 1.5 MG/0.5ML: SUBCUTANEOUS | 84 days supply | Qty: 6 | Fill #0 | Status: AC

## 2020-07-23 ENCOUNTER — Other Ambulatory Visit: Payer: Self-pay

## 2020-08-10 ENCOUNTER — Other Ambulatory Visit: Payer: Self-pay | Admitting: Nurse Practitioner

## 2020-08-10 ENCOUNTER — Other Ambulatory Visit: Payer: Self-pay

## 2020-08-10 LAB — HM DIABETES EYE EXAM

## 2020-08-10 MED ORDER — LISINOPRIL 2.5 MG PO TABS
ORAL_TABLET | ORAL | 0 refills | Status: DC
  Filled 2020-08-10: qty 90, 90d supply, fill #0

## 2020-08-10 MED ORDER — GLIPIZIDE ER 5 MG PO TB24
ORAL_TABLET | Freq: Every day | ORAL | 0 refills | Status: DC
  Filled 2020-08-10: qty 90, 90d supply, fill #0

## 2020-08-10 NOTE — Telephone Encounter (Signed)
Requested Prescriptions  Pending Prescriptions Disp Refills  . glipiZIDE (GLIPIZIDE XL) 5 MG 24 hr tablet 90 tablet 0    Sig: TAKE 1 TABLET (5 MG TOTAL) BY MOUTH DAILY WITH BREAKFAST.     Endocrinology:  Diabetes - Sulfonylureas Failed - 08/10/2020  9:08 AM      Failed - HBA1C is between 0 and 7.9 and within 180 days    HbA1c, POC (controlled diabetic range)  Date Value Ref Range Status  12/19/2019 9.1 (A) 0.0 - 7.0 % Final   Hgb A1c MFr Bld  Date Value Ref Range Status  06/22/2020 8.0 (H) 4.8 - 5.6 % Final    Comment:             Prediabetes: 5.7 - 6.4          Diabetes: >6.4          Glycemic control for adults with diabetes: <7.0          Passed - Valid encounter within last 6 months    Recent Outpatient Visits          1 month ago Type 2 diabetes mellitus with hyperglycemia, without long-term current use of insulin (HCC)   [place] [facility] [place], [place] W, NP   3 months ago Type 2 diabetes mellitus with hyperglycemia, without long-term current use of insulin [facility])   [place] [facility] [place], [place] W, NP   4 months ago Type 2 diabetes mellitus with hyperglycemia, without long-term current use of insulin [facility])   [place] [facility] [place], [place] W, NP   7 months ago Type 2 diabetes mellitus with hyperglycemia, without long-term current use of insulin (HCC)   [place] [facility] [place], [place], MD   1 year ago Type 2 diabetes mellitus with hyperglycemia, without long-term current use of insulin [facility])    [facility] [place], [place] Stakes, NP      Future Appointments            In 4 weeks Claiborne Rigg, NP [facility]           . lisinopril (ZESTRIL) 2.5 MG tablet 90 tablet 0    Sig: TAKE 1 TABLET (2.5 MG TOTAL) BY MOUTH DAILY WITH LUNCH.     Cardiovascular:  ACE Inhibitors Failed - 08/10/2020  9:08 AM      Failed - Cr in  normal range and within 180 days    Creat  Date Value Ref Range Status  07/09/2015 0.43 (L) 0.50 - 1.10 mg/dL Final   Creatinine, Ser  Date Value Ref Range Status  06/22/2020 0.54 (L) 0.57 - 1.00 mg/dL Final   Creatinine, POC  Date Value Ref Range Status  05/21/2017 300 mg/dL Final   Creatinine, Urine  Date Value Ref Range Status  03/06/2016 37 20 - 320 mg/dL Final         Passed - K in normal range and within 180 days    Potassium  Date Value Ref Range Status  06/22/2020 4.7 3.5 - 5.2 mmol/L Final         Passed - Patient is not pregnant      Passed - Last BP in normal range    BP Readings from Last 1 Encounters:  03/19/20 117/75         Passed - Valid encounter within last 6 months    Recent Outpatient Visits  1 month ago Type 2 diabetes mellitus with hyperglycemia, without long-term current use of insulin [facility])   [place] [facility] [place], [place] W, NP   3 months ago Type 2 diabetes mellitus with hyperglycemia, without long-term current use of insulin [facility])   [place] [facility] [place], [place] W, NP   4 months ago Type 2 diabetes mellitus with hyperglycemia, without long-term current use of insulin [facility])   [place] [facility] [place], [place] W, NP   7 months ago Type 2 diabetes mellitus with hyperglycemia, without long-term current use of insulin (HCC)   [place] [facility] [place], [place], MD   1 year ago Type 2 diabetes mellitus with hyperglycemia, without long-term current use of insulin [facility])   [place] [facility] [place], Shea Stakes, NP      Future Appointments            In 4 weeks Claiborne Rigg, NP [facility]

## 2020-09-08 ENCOUNTER — Encounter: Payer: Self-pay | Admitting: Nurse Practitioner

## 2020-09-08 ENCOUNTER — Other Ambulatory Visit: Payer: Self-pay

## 2020-09-08 ENCOUNTER — Ambulatory Visit: Payer: Medicaid Other | Attending: Nurse Practitioner | Admitting: Nurse Practitioner

## 2020-09-08 ENCOUNTER — Telehealth: Payer: Self-pay | Admitting: Nurse Practitioner

## 2020-09-08 DIAGNOSIS — Z1231 Encounter for screening mammogram for malignant neoplasm of breast: Secondary | ICD-10-CM

## 2020-09-08 DIAGNOSIS — E785 Hyperlipidemia, unspecified: Secondary | ICD-10-CM

## 2020-09-08 DIAGNOSIS — E1165 Type 2 diabetes mellitus with hyperglycemia: Secondary | ICD-10-CM | POA: Diagnosis not present

## 2020-09-08 DIAGNOSIS — Z1211 Encounter for screening for malignant neoplasm of colon: Secondary | ICD-10-CM

## 2020-09-08 MED ORDER — METFORMIN HCL 1000 MG PO TABS
1000.0000 mg | ORAL_TABLET | Freq: Two times a day (BID) | ORAL | 0 refills | Status: DC
  Filled 2020-09-08: qty 180, 90d supply, fill #0

## 2020-09-08 MED ORDER — ATORVASTATIN CALCIUM 20 MG PO TABS
20.0000 mg | ORAL_TABLET | Freq: Every day | ORAL | 1 refills | Status: DC
Start: 2020-09-08 — End: 2021-01-24
  Filled 2020-09-08: qty 90, 90d supply, fill #0
  Filled 2020-11-18: qty 90, 90d supply, fill #1

## 2020-09-08 MED ORDER — BD PEN NEEDLE MINI U/F 31G X 5 MM MISC
3 refills | Status: DC
  Filled 2020-09-08: qty 100, 30d supply, fill #0
  Filled 2021-07-20: qty 100, 34d supply, fill #0

## 2020-09-08 MED ORDER — ACCU-CHEK SOFTCLIX LANCETS MISC
3 refills | Status: DC
  Filled 2020-09-08 – 2021-05-24 (×2): qty 100, 30d supply, fill #0

## 2020-09-08 MED ORDER — DULAGLUTIDE 1.5 MG/0.5ML ~~LOC~~ SOAJ
1.5000 mg | SUBCUTANEOUS | 1 refills | Status: DC
  Filled 2020-09-08 – 2020-11-18 (×2): qty 6, 84d supply, fill #0

## 2020-09-08 MED ORDER — GLUCOSE BLOOD VI STRP: ORAL_STRIP | 11 refills | Status: DC

## 2020-09-08 MED ORDER — GABAPENTIN 100 MG PO CAPS
100.0000 mg | ORAL_CAPSULE | Freq: Three times a day (TID) | ORAL | 1 refills | Status: DC | PRN
  Filled 2020-09-08: qty 180, 30d supply, fill #0

## 2020-09-08 MED ORDER — ACCU-CHEK GUIDE VI STRP
ORAL_STRIP | 11 refills | Status: DC
Start: 2020-09-08 — End: 2021-09-24
  Filled 2020-09-08 – 2021-05-24 (×2): qty 50, 25d supply, fill #0
  Filled 2021-07-20: qty 50, 25d supply, fill #1

## 2020-09-08 MED ORDER — LISINOPRIL 2.5 MG PO TABS
2.5000 mg | ORAL_TABLET | Freq: Every day | ORAL | 1 refills | Status: DC
  Filled 2020-09-08: qty 90, 90d supply, fill #0
  Filled 2020-11-18: qty 90, 90d supply, fill #1

## 2020-09-08 NOTE — Progress Notes (Signed)
Virtual Visit via Telephone Note Due to national recommendations of social distancing due to COVID 19, telehealth visit is felt to be most appropriate for this patient at this time.  I discussed the limitations, risks, security and privacy concerns of performing an evaluation and management service by telephone and the availability of in person appointments. I also discussed with the patient that there may be a patient responsible charge related to this service. The patient expressed understanding and agreed to proceed.    I connected with Norma Chambers on 09/08/20  at   9:50 AM EDT  EDT by telephone and verified that I am speaking with the correct person using two identifiers.  Location of Patient: Private Residence   [facility] of Provider: [facility]    Persons participating in Telemedicine visit: Bertram Denver FNP-BC Scottie Alers    History of Present Illness: Telemedicine visit for: Follow up  has a past medical history of Gestational diabetes, Obesity, and Type 2 diabetes mellitus with hyperglycemia, without long-term current use of insulin (HCC).  Patient has been counseled on age-appropriate routine health concerns for screening and prevention. These are reviewed and up-to-date. Referrals have been placed accordingly. Immunizations are up-to-date or declined.    Mammogram: Referral placed today Colonoscopy: Referral placed today   DM 2 Not well controlled. She does report her glucose levels have improved since her last visit in March . Fasting reading this morning 125. Yesterday post prandial reading 145.  She does notice increased glucose levels of course based on what she is eating at home.  She endorses adherence to taking Trulicity 1.5 mg weekly, glipizide XL 5 mg daily and metformin 1000 mg twice daily.  She is also on a renal dose ACE and statin.  LDL is at goal.  She takes gabapentin 100 to 200 mg 3 times a day as needed.  Blood pressure is well  controlled. Lab Results  Component Value Date   HGBA1C 8.0 (H) 06/22/2020   Lab Results  Component Value Date   LDLCALC 40 03/19/2020   BP Readings from Last 3 Encounters:  03/19/20 117/75  12/23/19 133/81  12/19/19 130/82     Past Medical History:  Diagnosis Date  . Gestational diabetes   . Obesity   . Type 2 diabetes mellitus with hyperglycemia, without long-term current use of insulin [facility])     Past Surgical History:  Procedure Laterality Date  . CESAREAN SECTION    . CESAREAN SECTION N/A 11/18/2012   Procedure: REPEAT CESAREAN SECTION;  Surgeon: Catalina Antigua, MD;  Location: [facility];  Service: Obstetrics;  Laterality: N/A;    Family History  Problem Relation Age of Onset  . Diabetes Mother   . Diabetes Maternal Grandmother   . Bell's palsy Neg Hx     Social History   Socioeconomic History  . Marital status: Married    Spouse name: Not on file  . Number of children: Not on file  . Years of education: Not on file  . Highest education level: Not on file  Occupational History  . Not on file  Tobacco Use  . Smoking status: Never Smoker  . Smokeless tobacco: Never Used  Vaping Use  . Vaping Use: Never used  Substance and Sexual Activity  . Alcohol use: No  . Drug use: No  . Sexual activity: Not Currently  Other Topics Concern  . Not on file  Social History Narrative   Lives at home with husband and 3 children  Right handed   Caffeine: 1 cup/day   Social Determinants of Health   Financial Resource Strain: Not on file  Food Insecurity: Not on file  Transportation Needs: Not on file  Physical Activity: Not on file  Stress: Not on file  Social Connections: Not on file     Observations/Objective: Awake, alert and oriented x 3   Review of Systems  Constitutional: Negative for fever, malaise/fatigue and weight loss.  HENT: Negative.  Negative for nosebleeds.   Eyes: Negative.  Negative for blurred vision, double vision and photophobia.   Respiratory: Negative.  Negative for cough and shortness of breath.   Cardiovascular: Negative.  Negative for chest pain, palpitations and leg swelling.  Gastrointestinal: Negative.  Negative for heartburn, nausea and vomiting.  Musculoskeletal: Negative.  Negative for myalgias.  Neurological: Negative.  Negative for dizziness, focal weakness, seizures and headaches.  Psychiatric/Behavioral: Negative.  Negative for suicidal ideas.    Assessment and Plan: Diagnoses and all orders for this visit:  Type 2 diabetes mellitus with hyperglycemia, without long-term current use of insulin (HCC) -     Dulaglutide 1.5 MG/0.5ML SOPN; Inject 1.5 mg into the skin once a week. -     metFORMIN (GLUCOPHAGE) 1000 MG tablet; Take 1 tablet (1,000 mg total) by mouth 2 (two) times daily with a meal. -     lisinopril (ZESTRIL) 2.5 MG tablet; Take 1 tablet (2.5 mg total) by mouth daily. -     glucose blood (ACCU-CHEK GUIDE) test strip; USE AS INSTRUCTED. CHECK BLOOD GLUCOSE LEVEL BY FINGERSTICK TWICE PER DAY. -     Insulin Pen Needle (B-D UF III MINI PEN NEEDLES) 31G X 5 MM MISC; Use as instructed. Inject into the skin once weekly. Please fill as a 90 day supply -     Accu-Chek Softclix Lancets lancets; Use to inject into the skin twice daily -     gabapentin (NEURONTIN) 100 MG capsule; Take 1-2 capsules (100-200 mg total) by mouth 3 (three) times daily as needed. -     Hemoglobin A1c; Future Continue blood sugar control as discussed in office today, low carbohydrate diet, and regular physical exercise as tolerated, 150 minutes per week (30 min each day, 5 days per week, or 50 min 3 days per week). Keep blood sugar logs with fasting goal of 90-130 mg/dl, post prandial (after you eat) less than 180.  For Hypoglycemia: BS <60 and Hyperglycemia BS >400; contact the clinic ASAP. Annual eye exams and foot exams are recommended.   Dyslipidemia, goal LDL below 70 -     atorvastatin (LIPITOR) 20 MG tablet; Take 1 tablet  (20 mg total) by mouth daily. INSTRUCTIONS: Work on a low fat, heart healthy diet and participate in regular aerobic exercise program by working out at least 150 minutes per week; 5 days a week-30 minutes per day. Avoid red meat/beef/steak,  fried foods. junk foods, sodas, sugary drinks, unhealthy snacking, alcohol and smoking.  Drink at least 80 oz of water per day and monitor your carbohydrate intake daily.    Breast cancer screening by mammogram -     MM DIGITAL SCREENING BILATERAL; Future  Colon cancer screening -     Ambulatory referral to Gastroenterology     Follow Up Instructions Return in about 4 months (around 01/08/2021).     I discussed the assessment and treatment plan with the patient. The patient was provided an opportunity to ask questions and all were answered. The patient agreed with the plan and demonstrated  an understanding of the instructions.   The patient was advised to call back or seek an in-person evaluation if the symptoms worsen or if the condition fails to improve as anticipated.  I provided 14 minutes of non-face-to-face time during this encounter including median intraservice time, reviewing previous notes, labs, imaging, medications and explaining diagnosis and management.  Claiborne Rigg, FNP-BC

## 2020-09-08 NOTE — Telephone Encounter (Signed)
No answer. LVM to return call. Will try again in 5-10minutes .

## 2020-09-12 DIAGNOSIS — Z20822 Contact with and (suspected) exposure to covid-19: Secondary | ICD-10-CM | POA: Diagnosis not present

## 2020-09-13 ENCOUNTER — Other Ambulatory Visit: Payer: Self-pay

## 2020-11-18 ENCOUNTER — Other Ambulatory Visit: Payer: Self-pay | Admitting: Nurse Practitioner

## 2020-11-18 ENCOUNTER — Other Ambulatory Visit: Payer: Self-pay

## 2020-11-18 DIAGNOSIS — E1165 Type 2 diabetes mellitus with hyperglycemia: Secondary | ICD-10-CM

## 2020-11-18 MED ORDER — GLIPIZIDE ER 5 MG PO TB24
5.0000 mg | ORAL_TABLET | Freq: Every day | ORAL | 2 refills | Status: DC
  Filled 2020-11-18: qty 30, 30d supply, fill #0
  Filled 2020-12-14 – 2020-12-21 (×2): qty 30, 30d supply, fill #1
  Filled 2021-01-17 – 2021-01-24 (×2): qty 30, 30d supply, fill #2

## 2020-11-18 MED ORDER — METFORMIN HCL 1000 MG PO TABS
1000.0000 mg | ORAL_TABLET | Freq: Two times a day (BID) | ORAL | 2 refills | Status: DC
  Filled 2020-11-18: qty 60, 30d supply, fill #0
  Filled 2020-12-14 – 2020-12-21 (×2): qty 60, 30d supply, fill #1
  Filled 2021-01-17 – 2021-01-24 (×2): qty 60, 30d supply, fill #2

## 2020-11-18 NOTE — Telephone Encounter (Signed)
Requested medication (s) are due for refill today:  yes  Requested medication (s) are on the active medication list: yes  Future visit scheduled: no  Notes to clinic:  Failed protocol: Cr in normal range and within 360 days   HBA1C is between 0 and 7.9 and within 180 days     Requested Prescriptions  Pending Prescriptions Disp Refills   glipiZIDE (GLIPIZIDE XL) 5 MG 24 hr tablet 90 tablet 0    Sig: TAKE 1 TABLET (5 MG TOTAL) BY MOUTH DAILY WITH BREAKFAST.     Endocrinology:  Diabetes - Sulfonylureas Failed - 11/18/2020  9:13 AM      Failed - HBA1C is between 0 and 7.9 and within 180 days    HbA1c, POC (controlled diabetic range)  Date Value Ref Range Status  12/19/2019 9.1 (A) 0.0 - 7.0 % Final   Hgb A1c MFr Bld  Date Value Ref Range Status  06/22/2020 8.0 (H) 4.8 - 5.6 % Final    Comment:             Prediabetes: 5.7 - 6.4          Diabetes: >6.4          Glycemic control for adults with diabetes: <7.0           Passed - Valid encounter within last 6 months    Recent Outpatient Visits           2 months ago Type 2 diabetes mellitus with hyperglycemia, without long-term current use of insulin ([place])   [place], [place] W, NP   5 months ago Type 2 diabetes mellitus with hyperglycemia, without long-term current use of insulin ([place])   [place], [place] W, NP   6 months ago Type 2 diabetes mellitus with hyperglycemia, without long-term current use of insulin ([place])   [place], [place] W, NP   8 months ago Type 2 diabetes mellitus with hyperglycemia, without long-term current use of insulin ([place])   [place], Vernia Buff, NP   11 months ago Type 2 diabetes mellitus with hyperglycemia, without long-term current use of insulin ([place])   [place] Fulp, [place], MD               metFORMIN  (GLUCOPHAGE) 1000 MG tablet 180 tablet 0    Sig: Take 1 tablet (1,000 mg total) by mouth 2 (two) times daily with a meal.     Endocrinology:  Diabetes - Biguanides Failed - 11/18/2020  9:13 AM      Failed - Cr in normal range and within 360 days    Creat  Date Value Ref Range Status  07/09/2015 0.43 (L) 0.50 - 1.10 mg/dL Final   Creatinine, Ser  Date Value Ref Range Status  06/22/2020 0.54 (L) 0.57 - 1.00 mg/dL Final   Creatinine, POC  Date Value Ref Range Status  05/21/2017 300 mg/dL Final   Creatinine, Urine  Date Value Ref Range Status  03/06/2016 37 20 - 320 mg/dL Final          Failed - HBA1C is between 0 and 7.9 and within 180 days    HbA1c, POC (controlled diabetic range)  Date Value Ref Range Status  12/19/2019 9.1 (A) 0.0 - 7.0 % Final   Hgb A1c MFr Bld  Date Value Ref Range Status  06/22/2020 8.0 (H) 4.8 -  5.6 % Final    Comment:             Prediabetes: 5.7 - 6.4          Diabetes: >6.4          Glycemic control for adults with diabetes: <7.0           Passed - eGFR in normal range and within 360 days    GFR, Est African American  Date Value Ref Range Status  07/09/2015 >89 >=60 mL/min Final   GFR calc Af Amer  Date Value Ref Range Status  03/19/2020 128 >59 mL/min/1.73 Final    Comment:    **In accordance with recommendations from the NKF-ASN Task force,**   [facility] is in the process of updating its eGFR calculation to the   2021 CKD-EPI creatinine equation that estimates kidney function   without a race variable.    GFR, Est Non African American  Date Value Ref Range Status  07/09/2015 >89 >=60 mL/min Final    Comment:      The estimated GFR is a calculation valid for adults (>=35 years old) that uses the CKD-EPI algorithm to adjust for age and sex. It is   not to be used for children, pregnant women, hospitalized patients,    patients on dialysis, or with rapidly changing kidney function. According to the NKDEP, eGFR >89 is normal, 60-89  shows mild impairment, 30-59 shows moderate impairment, 15-29 shows severe impairment and <15 is ESRD.      GFR calc non Af Amer  Date Value Ref Range Status  03/19/2020 111 >59 mL/min/1.73 Final   eGFR  Date Value Ref Range Status  06/22/2020 114 >59 mL/min/1.73 Final          Passed - Valid encounter within last 6 months    Recent Outpatient Visits           2 months ago Type 2 diabetes mellitus with hyperglycemia, without long-term current use of insulin [facility])   [place], [place] W, NP   5 months ago Type 2 diabetes mellitus with hyperglycemia, without long-term current use of insulin [facility])   [place], [place] W, NP   6 months ago Type 2 diabetes mellitus with hyperglycemia, without long-term current use of insulin [facility])   [place], [place] W, NP   8 months ago Type 2 diabetes mellitus with hyperglycemia, without long-term current use of insulin [facility])   [place], [place] W, NP   11 months ago Type 2 diabetes mellitus with hyperglycemia, without long-term current use of insulin ([place])   [place] [facility] [place]y Blackbird, MD

## 2020-11-23 ENCOUNTER — Other Ambulatory Visit: Payer: Self-pay

## 2020-12-13 ENCOUNTER — Other Ambulatory Visit: Payer: Self-pay

## 2020-12-14 ENCOUNTER — Other Ambulatory Visit: Payer: Self-pay

## 2020-12-21 ENCOUNTER — Other Ambulatory Visit: Payer: Self-pay

## 2021-01-17 ENCOUNTER — Other Ambulatory Visit: Payer: Self-pay

## 2021-01-17 ENCOUNTER — Other Ambulatory Visit: Payer: Self-pay | Admitting: Nurse Practitioner

## 2021-01-17 DIAGNOSIS — E1165 Type 2 diabetes mellitus with hyperglycemia: Secondary | ICD-10-CM

## 2021-01-17 DIAGNOSIS — E785 Hyperlipidemia, unspecified: Secondary | ICD-10-CM

## 2021-01-17 NOTE — Telephone Encounter (Signed)
Requested Prescriptions  Pending Prescriptions Disp Refills  . lisinopril (ZESTRIL) 2.5 MG tablet 90 tablet 1    Sig: Take 1 tablet (2.5 mg total) by mouth daily.     Cardiovascular:  ACE Inhibitors Failed - 01/17/2021  1:56 PM      Failed - Cr in normal range and within 180 days    Creat  Date Value Ref Range Status  07/09/2015 0.43 (L) 0.50 - 1.10 mg/dL Final   Creatinine, Ser  Date Value Ref Range Status  06/22/2020 0.54 (L) 0.57 - 1.00 mg/dL Final   Creatinine, POC  Date Value Ref Range Status  05/21/2017 300 mg/dL Final   Creatinine, Urine  Date Value Ref Range Status  03/06/2016 37 20 - 320 mg/dL Final         Failed - K in normal range and within 180 days    Potassium  Date Value Ref Range Status  06/22/2020 4.7 3.5 - 5.2 mmol/L Final         Passed - Patient is not pregnant      Passed - Last BP in normal range    BP Readings from Last 1 Encounters:  03/19/20 117/75         Passed - Valid encounter within last 6 months    Recent Outpatient Visits          4 months ago Type 2 diabetes mellitus with hyperglycemia, without long-term current use of insulin (HCC)   [place] [facility] [place], Shea Stakes, NP   7 months ago Type 2 diabetes mellitus with hyperglycemia, without long-term current use of insulin (HCC)   [place] [facility] [place], Shea Stakes, NP   8 months ago Type 2 diabetes mellitus with hyperglycemia, without long-term current use of insulin (HCC)   [place] [facility] [place], [place] W, NP   10 months ago Type 2 diabetes mellitus with hyperglycemia, without long-term current use of insulin (HCC)   [place] [facility] [place], [place] W, NP   1 year ago Type 2 diabetes mellitus with hyperglycemia, without long-term current use of insulin (HCC)   [employer] [facility] [place], [place], MD             . atorvastatin (LIPITOR) 20 MG tablet 90 tablet 1     Sig: Take 1 tablet (20 mg total) by mouth daily.     Cardiovascular:  Antilipid - Statins Failed - 01/17/2021  1:56 PM      Failed - HDL in normal range and within 360 days    HDL  Date Value Ref Range Status  03/19/2020 35 (L) >39 mg/dL Final         Failed - Triglycerides in normal range and within 360 days    Triglycerides  Date Value Ref Range Status  03/19/2020 192 (H) 0 - 149 mg/dL Final         Passed - Total Cholesterol in normal range and within 360 days    Cholesterol, Total  Date Value Ref Range Status  03/19/2020 106 100 - 199 mg/dL Final         Passed - LDL in normal range and within 360 days    LDL Chol Calc (NIH)  Date Value Ref Range Status  03/19/2020 40 0 - 99 mg/dL Final         Passed - Patient is not pregnant      Passed - Valid  encounter within last 12 months    Recent Outpatient Visits          4 months ago Type 2 diabetes mellitus with hyperglycemia, without long-term current use of insulin [facility])   [place] [facility] [place], [place] W, NP   7 months ago Type 2 diabetes mellitus with hyperglycemia, without long-term current use of insulin [facility])   [place] [facility] [place], [place] W, NP   8 months ago Type 2 diabetes mellitus with hyperglycemia, without long-term current use of insulin [facility])   [place] [facility] [place], [place] W, NP   10 months ago Type 2 diabetes mellitus with hyperglycemia, without long-term current use of insulin [facility])   [place] [facility] [place], [place] W, NP   1 year ago Type 2 diabetes mellitus with hyperglycemia, without long-term current use of insulin (HCC)   [place] [facility] Cain Saupe, MD             Requesting too soon. Filled for 90 days supply on 11/23/20.

## 2021-01-19 ENCOUNTER — Other Ambulatory Visit: Payer: Self-pay

## 2021-01-24 ENCOUNTER — Other Ambulatory Visit: Payer: Self-pay | Admitting: Nurse Practitioner

## 2021-01-24 ENCOUNTER — Other Ambulatory Visit: Payer: Self-pay

## 2021-01-24 DIAGNOSIS — E785 Hyperlipidemia, unspecified: Secondary | ICD-10-CM

## 2021-01-24 DIAGNOSIS — E1165 Type 2 diabetes mellitus with hyperglycemia: Secondary | ICD-10-CM

## 2021-01-24 MED ORDER — ATORVASTATIN CALCIUM 20 MG PO TABS
20.0000 mg | ORAL_TABLET | Freq: Every day | ORAL | 0 refills | Status: DC
  Filled 2021-01-24 – 2021-02-21 (×2): qty 90, 90d supply, fill #0

## 2021-01-24 MED ORDER — LISINOPRIL 2.5 MG PO TABS
2.5000 mg | ORAL_TABLET | Freq: Every day | ORAL | 0 refills | Status: DC
  Filled 2021-01-24 – 2021-02-21 (×2): qty 90, 90d supply, fill #0

## 2021-01-24 NOTE — Telephone Encounter (Signed)
Requested Prescriptions  Pending Prescriptions Disp Refills  . atorvastatin (LIPITOR) 20 MG tablet 90 tablet 0    Sig: Take 1 tablet (20 mg total) by mouth daily.     Cardiovascular:  Antilipid - Statins Failed - 01/24/2021 11:19 AM      Failed - HDL in normal range and within 360 days    HDL  Date Value Ref Range Status  03/19/2020 35 (L) >39 mg/dL Final         Failed - Triglycerides in normal range and within 360 days    Triglycerides  Date Value Ref Range Status  03/19/2020 192 (H) 0 - 149 mg/dL Final         Passed - Total Cholesterol in normal range and within 360 days    Cholesterol, Total  Date Value Ref Range Status  03/19/2020 106 100 - 199 mg/dL Final         Passed - LDL in normal range and within 360 days    LDL Chol Calc (NIH)  Date Value Ref Range Status  03/19/2020 40 0 - 99 mg/dL Final         Passed - Patient is not pregnant      Passed - Valid encounter within last 12 months    Recent Outpatient Visits          4 months ago Type 2 diabetes mellitus with hyperglycemia, without long-term current use of insulin (HCC)   [place] [facility] [place], [place] W, NP   7 months ago Type 2 diabetes mellitus with hyperglycemia, without long-term current use of insulin [facility])   [place] [facility] [place], [place] W, NP   9 months ago Type 2 diabetes mellitus with hyperglycemia, without long-term current use of insulin [facility])   [place] [facility] [place], [place] W, NP   10 months ago Type 2 diabetes mellitus with hyperglycemia, without long-term current use of insulin [facility])   [place] [facility] [place], [place] W, NP   1 year ago Type 2 diabetes mellitus with hyperglycemia, without long-term current use of insulin (HCC)    [facility] Cain Saupe, MD      Future Appointments            In 2 days Claiborne Rigg, NP [facility]           . lisinopril (ZESTRIL) 2.5 MG tablet 90 tablet 0    Sig: Take 1 tablet (2.5 mg total) by mouth daily.     Cardiovascular:  ACE Inhibitors Failed - 01/24/2021 11:19 AM      Failed - Cr in normal range and within 180 days    Creat  Date Value Ref Range Status  07/09/2015 0.43 (L) 0.50 - 1.10 mg/dL Final   Creatinine, Ser  Date Value Ref Range Status  06/22/2020 0.54 (L) 0.57 - 1.00 mg/dL Final   Creatinine, POC  Date Value Ref Range Status  05/21/2017 300 mg/dL Final   Creatinine, Urine  Date Value Ref Range Status  03/06/2016 37 20 - 320 mg/dL Final         Failed - K in normal range and within 180 days    Potassium  Date Value Ref Range Status  06/22/2020 4.7 3.5 - 5.2 mmol/L Final         Passed - Patient is not pregnant      Passed - Last  BP in normal range    BP Readings from Last 1 Encounters:  03/19/20 117/75         Passed - Valid encounter within last 6 months    Recent Outpatient Visits          4 months ago Type 2 diabetes mellitus with hyperglycemia, without long-term current use of insulin [facility])   [place] [facility] [place], [place] W, NP   7 months ago Type 2 diabetes mellitus with hyperglycemia, without long-term current use of insulin [facility])   [place] [facility] [place], [place] W, NP   9 months ago Type 2 diabetes mellitus with hyperglycemia, without long-term current use of insulin [facility])   [place] [facility] [place], [place] W, NP   10 months ago Type 2 diabetes mellitus with hyperglycemia, without long-term current use of insulin [facility])   [place] [facility] [place], [place] W, NP   1 year ago Type 2 diabetes mellitus with hyperglycemia, without long-term current use of insulin (HCC)   [place] [facility] Cain Saupe, MD      Future Appointments            In 2 days Claiborne Rigg, NP [facility]

## 2021-01-24 NOTE — Telephone Encounter (Signed)
Medication Refill - Medication: Atorvastatin, Lisinopril  Has the patient contacted their pharmacy? Yes.   Pt states that she was told by pharmacy that the prescriptions had been denied for refill. Please advise.  (Agent: If no, request that the patient contact the pharmacy for the refill. If patient does not wish to contact the pharmacy document the reason why and proceed with request.) (Agent: If yes, when and what did the pharmacy advise?)  Preferred Pharmacy (with phone number or street name):  [facility]  [address] [place] [address]  Phone: [phone] Fax: [phone]  Hours: M-F 8:30a-5:30p   Has the patient been seen for an appointment in the last year OR does the patient have an upcoming appointment? Yes.   01/26/21  Agent: Please be advised that RX refills may take up to 3 business days. We ask that you follow-up with your pharmacy.

## 2021-01-25 NOTE — Telephone Encounter (Signed)
Rxs were refilled. Thanks!

## 2021-01-26 ENCOUNTER — Ambulatory Visit: Payer: Medicaid Other | Attending: Nurse Practitioner | Admitting: Nurse Practitioner

## 2021-01-26 ENCOUNTER — Encounter: Payer: Self-pay | Admitting: Nurse Practitioner

## 2021-01-26 ENCOUNTER — Other Ambulatory Visit: Payer: Self-pay

## 2021-01-26 VITALS — BP 129/84 | HR 100 | Resp 16 | Wt 286.8 lb

## 2021-01-26 DIAGNOSIS — Z6841 Body Mass Index (BMI) 40.0 and over, adult: Secondary | ICD-10-CM

## 2021-01-26 DIAGNOSIS — E1165 Type 2 diabetes mellitus with hyperglycemia: Secondary | ICD-10-CM

## 2021-01-26 DIAGNOSIS — Z1211 Encounter for screening for malignant neoplasm of colon: Secondary | ICD-10-CM

## 2021-01-26 LAB — POCT GLYCOSYLATED HEMOGLOBIN (HGB A1C): HbA1c, POC (controlled diabetic range): 9.2 % — AB (ref 0.0–7.0)

## 2021-01-26 LAB — GLUCOSE, POCT (MANUAL RESULT ENTRY): POC Glucose: 177 mg/dl — AB (ref 70–99)

## 2021-01-26 MED ORDER — GLIPIZIDE ER 10 MG PO TB24
10.0000 mg | ORAL_TABLET | Freq: Every day | ORAL | 0 refills | Status: DC
  Filled 2021-01-26: qty 90, 90d supply, fill #0

## 2021-01-26 MED ORDER — TRULICITY 3 MG/0.5ML ~~LOC~~ SOAJ
3.0000 mg | SUBCUTANEOUS | 2 refills | Status: DC
  Filled 2021-01-26: qty 2, 28d supply, fill #0

## 2021-01-26 NOTE — Progress Notes (Signed)
Assessment & Plan:  Norma Chambers was seen today for diabetes.  Diagnoses and all orders for this visit:  Type 2 diabetes mellitus with hyperglycemia, without long-term current use of insulin (HCC) -     POCT glucose (manual entry) -     POCT glycosylated hemoglobin (Hb A1C) -     Dulaglutide (TRULICITY) 3 SN/0.5LZ SOPN; Inject 3 mg as directed once a week. -     Thyroid Panel With TSH -     CMP14+EGFR  Morbid obesity with BMI of 50.0-59.9, adult (HCC) -     Amb Referral to Bariatric Surgery -     Thyroid Panel With TSH  Colon cancer screening -     Fecal occult blood, imunochemical([facility])  Other orders -     glipiZIDE (GLIPIZIDE XL) 10 MG 24 hr tablet; Take 1 tablet (10 mg total) by mouth daily with breakfast.   Patient has been counseled on age-appropriate routine health concerns for screening and prevention. These are reviewed and up-to-date. Referrals have been placed accordingly. Immunizations are up-to-date or declined.    Subjective:   Chief Complaint  Patient presents with   Diabetes    Diabetes Pertinent negatives for hypoglycemia include no dizziness, headaches or seizures. Pertinent negatives for diabetes include no blurred vision, no chest pain and no weight loss.  Norma Chambers 50 y.o. female presents to office today for follow-up to diabetes type 2 She has a past medical history of DM 2 and morbid obesity  MORBID OBESITY BMI 50. She is interested in bariatric surgery.  Comorbidities include diabetes type 2 and dyslipidemia   DM 2 Poorly controlled due to dietary and exercise nonadherence.  She is currently administering Trulicity 1.5 mg weekly and taking glipizide 5 mg daily along with metformin 1000 mg twice daily.  I am increasing Trulicity to 3 mg and glipizide will be increased to 10 mg from 5 mg daily.  She currently monitors her blood glucose levels twice a day with fasting averages 120s to 140s and postprandial average 150s to 170s.  She is on renal  dose ACE and statin with LDL at goal currently. Lab Results  Component Value Date   HGBA1C 9.2 (A) 01/26/2021    Lab Results  Component Value Date   HGBA1C 8.0 (H) 06/22/2020    BP Readings from Last 3 Encounters:  01/26/21 129/84  03/19/20 117/75  12/23/19 133/81   Lab Results  Component Value Date   LDLCALC 40 03/19/2020    Review of Systems  Constitutional:  Negative for fever, malaise/fatigue and weight loss.  HENT: Negative.  Negative for nosebleeds.   Eyes: Negative.  Negative for blurred vision, double vision and photophobia.  Respiratory: Negative.  Negative for cough and shortness of breath.   Cardiovascular: Negative.  Negative for chest pain, palpitations and leg swelling.  Gastrointestinal: Negative.  Negative for heartburn, nausea and vomiting.  Musculoskeletal: Negative.  Negative for myalgias.  Neurological: Negative.  Negative for dizziness, focal weakness, seizures and headaches.  Psychiatric/Behavioral: Negative.  Negative for suicidal ideas.    Past Medical History:  Diagnosis Date   Gestational diabetes    Obesity    Type 2 diabetes mellitus with hyperglycemia, without long-term current use of insulin ([place])     Past Surgical History:  Procedure Laterality Date   CESAREAN SECTION     CESAREAN SECTION N/A 11/18/2012   Procedure: REPEAT CESAREAN SECTION;  Surgeon: Mora Bellman, MD;  Location: [facility];  Service: Obstetrics;  Laterality: N/A;  Family History  Problem Relation Age of Onset   Diabetes Mother    Diabetes Maternal Grandmother    Bell's palsy Neg Hx     Social History Reviewed with no changes to be made today.   Outpatient Medications Prior to Visit  Medication Sig Dispense Refill   atorvastatin (LIPITOR) 20 MG tablet Take 1 tablet (20 mg total) by mouth daily. 90 tablet 0   glucose blood (ACCU-CHEK GUIDE) test strip USE AS INSTRUCTED. CHECK BLOOD GLUCOSE LEVEL BY FINGERSTICK TWICE PER DAY. 200 strip 11   Insulin Pen Needle (B-D UF  III MINI PEN NEEDLES) 31G X 5 MM MISC Use as instructed. Inject into the skin once weekly. Please fill as a 90 day supply 100 each 3   lisinopril (ZESTRIL) 2.5 MG tablet Take 1 tablet (2.5 mg total) by mouth daily. 90 tablet 0   metFORMIN (GLUCOPHAGE) 1000 MG tablet Take 1 tablet (1,000 mg total) by mouth 2 (two) times daily with a meal. 60 tablet 2   Dulaglutide 1.5 MG/0.5ML SOPN Inject 1.5 mg into the skin once a week. 6 mL 1   glipiZIDE (GLIPIZIDE XL) 5 MG 24 hr tablet Take 1 tablet (5 mg total) by mouth daily with breakfast. 30 tablet 2   Accu-Chek Softclix Lancets lancets Use to inject into the skin twice daily 200 each 3   Blood Glucose Monitoring Suppl (ACCU-CHEK GUIDE ME) w/Device KIT 1 kit by Does not apply route 2 (two) times daily. Use as instructed. Check blood glucose level by fingerstick twice per day. E11.65 1 kit 0   valACYclovir (VALTREX) 1000 MG tablet Take 1 tablet (1,000 mg total) by mouth 3 (three) times daily. (Patient not taking: Reported on 01/26/2021) 21 tablet 0   Dextromethorphan HBr 15 MG/5ML LIQD Take 5 mLs (15 mg total) by mouth 4 (four) times daily as needed. (Patient not taking: Reported on 01/26/2021) 140 mL 0   gabapentin (NEURONTIN) 100 MG capsule Take 1-2 capsules (100-200 mg total) by mouth 3 (three) times daily as needed. (Patient not taking: Reported on 01/26/2021) 180 capsule 1   Throat Lozenges (DIABETIC TUSSIN COUGH DROPS) 6 MG LOZG Take one cough drop as needed for cough (Patient not taking: Reported on 01/26/2021) 25 lozenge 1   No facility-administered medications prior to visit.    No Known Allergies     Objective:    BP 129/84   Pulse 100   Resp 16   Wt 286 lb 12.8 oz (130.1 kg)   SpO2 96%   BMI 50.80 kg/m  Wt Readings from Last 3 Encounters:  01/26/21 286 lb 12.8 oz (130.1 kg)  03/19/20 285 lb 3.2 oz (129.4 kg)  12/23/19 287 lb (130.2 kg)    Physical Exam Vitals and nursing note reviewed.  Constitutional:      Appearance: She is  well-developed.  HENT:     Head: Normocephalic and atraumatic.  Cardiovascular:     Rate and Rhythm: Normal rate and regular rhythm.     Heart sounds: Normal heart sounds. No murmur heard.   No friction rub. No gallop.  Pulmonary:     Effort: Pulmonary effort is normal. No tachypnea or respiratory distress.     Breath sounds: Normal breath sounds. No decreased breath sounds, wheezing, rhonchi or rales.  Chest:     Chest wall: No tenderness.  Abdominal:     General: Bowel sounds are normal.     Palpations: Abdomen is soft.  Musculoskeletal:  General: Normal range of motion.     Cervical back: Normal range of motion.  Skin:    General: Skin is warm and dry.  Neurological:     Mental Status: She is alert and oriented to person, place, and time.     Coordination: Coordination normal.  Psychiatric:        Behavior: Behavior normal. Behavior is cooperative.        Thought Content: Thought content normal.        Judgment: Judgment normal.         Patient has been counseled extensively about nutrition and exercise as well as the importance of adherence with medications and regular follow-up. The patient was given clear instructions to go to ER or return to medical center if symptoms don't improve, worsen or new problems develop. The patient verbalized understanding.   Follow-up: Return for 6 week see luke for meter check. See me for PAP SMEAR first available.   Gildardo Pounds, FNP-BC [facility] and [facility] [place], [place]   01/26/2021, 11:57 AM

## 2021-01-27 ENCOUNTER — Other Ambulatory Visit: Payer: Self-pay

## 2021-01-27 LAB — CMP14+EGFR
ALT: 51 IU/L — ABNORMAL HIGH (ref 0–32)
AST: 56 IU/L — ABNORMAL HIGH (ref 0–40)
Albumin/Globulin Ratio: 1.8 (ref 1.2–2.2)
Albumin: 4.7 g/dL (ref 3.8–4.8)
Alkaline Phosphatase: 89 IU/L (ref 44–121)
BUN/Creatinine Ratio: 17 (ref 9–23)
BUN: 9 mg/dL (ref 6–24)
Bilirubin Total: 0.4 mg/dL (ref 0.0–1.2)
CO2: 22 mmol/L (ref 20–29)
Calcium: 9.3 mg/dL (ref 8.7–10.2)
Chloride: 99 mmol/L (ref 96–106)
Creatinine, Ser: 0.54 mg/dL — ABNORMAL LOW (ref 0.57–1.00)
Globulin, Total: 2.6 g/dL (ref 1.5–4.5)
Glucose: 143 mg/dL — ABNORMAL HIGH (ref 70–99)
Potassium: 4.6 mmol/L (ref 3.5–5.2)
Sodium: 138 mmol/L (ref 134–144)
Total Protein: 7.3 g/dL (ref 6.0–8.5)
eGFR: 114 mL/min/{1.73_m2} (ref >59)

## 2021-01-27 LAB — THYROID PANEL WITH TSH
Free Thyroxine Index: 2 (ref 1.2–4.9)
T3 Uptake Ratio: 22 % — ABNORMAL LOW (ref 24–39)
T4, Total: 9.3 ug/dL (ref 4.5–12.0)
TSH: 1.75 u[IU]/mL (ref 0.450–4.500)

## 2021-01-31 ENCOUNTER — Other Ambulatory Visit: Payer: Self-pay

## 2021-02-21 ENCOUNTER — Other Ambulatory Visit: Payer: Self-pay | Admitting: Family Medicine

## 2021-02-21 ENCOUNTER — Other Ambulatory Visit: Payer: Self-pay

## 2021-02-21 DIAGNOSIS — E1165 Type 2 diabetes mellitus with hyperglycemia: Secondary | ICD-10-CM

## 2021-02-21 MED ORDER — METFORMIN HCL 1000 MG PO TABS
1000.0000 mg | ORAL_TABLET | Freq: Two times a day (BID) | ORAL | 2 refills | Status: DC
Start: 2021-02-21 — End: 2021-10-08
  Filled 2021-02-21: qty 60, 30d supply, fill #0
  Filled 2021-03-21: qty 60, 30d supply, fill #1
  Filled 2021-04-23: qty 60, 30d supply, fill #2
  Filled 2021-04-25: qty 60, 30d supply, fill #0

## 2021-02-21 NOTE — Telephone Encounter (Signed)
Requested Prescriptions  Pending Prescriptions Disp Refills  . metFORMIN (GLUCOPHAGE) 1000 MG tablet 60 tablet 2    Sig: Take 1 tablet (1,000 mg total) by mouth 2 (two) times daily with a meal.     Endocrinology:  Diabetes - Biguanides Failed - 02/21/2021  9:03 AM      Failed - Cr in normal range and within 360 days    Creat  Date Value Ref Range Status  07/09/2015 0.43 (L) 0.50 - 1.10 mg/dL Final   Creatinine, Ser  Date Value Ref Range Status  01/26/2021 0.54 (L) 0.57 - 1.00 mg/dL Final   Creatinine, POC  Date Value Ref Range Status  05/21/2017 300 mg/dL Final   Creatinine, Urine  Date Value Ref Range Status  03/06/2016 37 20 - 320 mg/dL Final         Failed - HBA1C is between 0 and 7.9 and within 180 days    HbA1c, POC (controlled diabetic range)  Date Value Ref Range Status  01/26/2021 9.2 (A) 0.0 - 7.0 % Final         Passed - eGFR in normal range and within 360 days    GFR, Est African American  Date Value Ref Range Status  07/09/2015 >89 >=60 mL/min Final   GFR calc Af Amer  Date Value Ref Range Status  03/19/2020 128 >59 mL/min/1.73 Final    Comment:    **In accordance with recommendations from the NKF-ASN Task force,**   [facility] is in the process of updating its eGFR calculation to the   2021 CKD-EPI creatinine equation that estimates kidney function   without a race variable.    GFR, Est Non African American  Date Value Ref Range Status  07/09/2015 >89 >=60 mL/min Final    Comment:      The estimated GFR is a calculation valid for adults (>=84 years old) that uses the CKD-EPI algorithm to adjust for age and sex. It is   not to be used for children, pregnant women, hospitalized patients,    patients on dialysis, or with rapidly changing kidney function. According to the NKDEP, eGFR >89 is normal, 60-89 shows mild impairment, 30-59 shows moderate impairment, 15-29 shows severe impairment and <15 is ESRD.      GFR calc non Af Amer  Date Value Ref  Range Status  03/19/2020 111 >59 mL/min/1.73 Final   eGFR  Date Value Ref Range Status  01/26/2021 114 >59 mL/min/1.73 Final         Passed - Valid encounter within last 6 months    Recent Outpatient Visits          3 weeks ago Type 2 diabetes mellitus with hyperglycemia, without long-term current use of insulin [facility])   [place], [place] W, NP   5 months ago Type 2 diabetes mellitus with hyperglycemia, without long-term current use of insulin [facility])   [place], [place] W, NP   8 months ago Type 2 diabetes mellitus with hyperglycemia, without long-term current use of insulin [facility])   [place], [place] W, NP   10 months ago Type 2 diabetes mellitus with hyperglycemia, without long-term current use of insulin [facility])   [place], [place] W, NP   11 months ago Type 2 diabetes mellitus with hyperglycemia, without long-term current use of insulin [facility])   [place], Vernia Buff, NP  Future Appointments            In 2 weeks Daisy Blossom, Jarome Matin, [place]

## 2021-02-23 ENCOUNTER — Other Ambulatory Visit: Payer: Self-pay

## 2021-03-08 NOTE — Progress Notes (Signed)
    S:    PCP: Zelda  Patient presents for diabetes evaluation, education, and management. Patient was referred and last seen by Primary Care Provider on 01/26/21 at which time A1c was elevated from previous so Trulicity and glipizide were increased.  Today, patient arrives in good spirits. Reports compliance with medication changes made at last visit and states she is doing well with these changes. Denies any adverse effects. Expresses desire to lose weight.   Patient reports Diabetes was diagnosed >10 years ago (during her second pregnancy).   Family/Social History:  -Fhx: DM in mother and maternal grandmother -Tobacco: never smoker  [employer] affordability: Medicaid  Medication adherence reported.   Current diabetes medications include: metformin 1000 mg BID, Trulicity 3 mg weekly (Wednesdays), glipizide XL 10 mg daily Current hypertension medications include: lisinopril 2.5 mg daily Current hyperlipidemia medications include: atorvastatin 20 mg daily  Patient denies hypoglycemic events.  Patient reported dietary habits: Eats 2 meals/day (eats breakfast and lunch; nothing after 3pm). Avoids rice, pasta, bread.   Patient-reported exercise habits: walks 15 mins max at a time because it starts to hurt her knees from the weight; no other formal exercise   Patient reports nocturia (nighttime urination). 2x per night Patient reports neuropathy (nerve pain). Pinky fingers.  Patient denies visual changes. Patient reports self foot exams.   O:  POCT BG: 164 (fasting)  Lab Results  Component Value Date   HGBA1C 9.2 (A) 01/26/2021   There were no vitals filed for this visit.  Lipid Panel     Component Value Date/Time   CHOL 106 03/19/2020 1035   TRIG 192 (H) 03/19/2020 1035   HDL 35 (L) 03/19/2020 1035   CHOLHDL 3.0 03/19/2020 1035   LDLCALC 40 03/19/2020 1035    Home fasting blood sugars: 178, 166, 158, 172; highest 200; lowest 121 2 hour  post-meal/random blood sugars: 180, 190  Clinical Atherosclerotic Cardiovascular Disease (ASCVD): No  The ASCVD Risk score (Arnett DK, et al., 2019) failed to calculate for the following reasons:   The valid total cholesterol range is 130 to 320 mg/dL   A/P:  Diabetes longstanding currently uncontrolled with most recent A1c 9.2 (01/26/21), up from 8.0 earlier this year. Patient is able to verbalize appropriate hypoglycemia management plan. Medication adherence appears appropriate. Control is suboptimal due to requiring titration of medications.  -Increase Trulicity to 4.5 mg weekly.  -Continue metformin 1000 mg BID and glipizide XL 10 mg daily  -Extensively discussed pathophysiology of diabetes, recommended lifestyle interventions, dietary effects on blood sugar control -Counseled on s/sx of and management of hypoglycemia -Next A1C anticipated January 2023.   Total time in face to face counseling 25 minutes. Follow up Pharmacist Clinic Visit in 4 weeks. Plan for follow up with PCP in beginning of February for next A1c.   Pervis Hocking, PharmD PGY2 Ambulatory Care Pharmacy Resident 03/09/2021 10:04 AM

## 2021-03-09 ENCOUNTER — Ambulatory Visit: Payer: Medicaid Other | Attending: Family Medicine | Admitting: Pharmacist

## 2021-03-09 ENCOUNTER — Other Ambulatory Visit: Payer: Self-pay

## 2021-03-09 DIAGNOSIS — E1165 Type 2 diabetes mellitus with hyperglycemia: Secondary | ICD-10-CM

## 2021-03-09 LAB — GLUCOSE, POCT (MANUAL RESULT ENTRY): POC Glucose: 164 mg/dl — AB (ref 70–99)

## 2021-03-09 MED ORDER — TRULICITY 4.5 MG/0.5ML ~~LOC~~ SOAJ
4.5000 mg | SUBCUTANEOUS | 3 refills | Status: DC
Start: 2021-03-09 — End: 2021-07-14
  Filled 2021-04-26: qty 2, 28d supply, fill #0
  Filled 2021-05-24: qty 2, 28d supply, fill #1
  Filled 2021-06-30: qty 2, 28d supply, fill #2

## 2021-03-21 ENCOUNTER — Other Ambulatory Visit: Payer: Self-pay | Admitting: Nurse Practitioner

## 2021-03-21 ENCOUNTER — Other Ambulatory Visit: Payer: Self-pay

## 2021-03-21 DIAGNOSIS — E785 Hyperlipidemia, unspecified: Secondary | ICD-10-CM

## 2021-03-21 DIAGNOSIS — E1165 Type 2 diabetes mellitus with hyperglycemia: Secondary | ICD-10-CM

## 2021-03-22 ENCOUNTER — Other Ambulatory Visit: Payer: Self-pay

## 2021-03-22 NOTE — Telephone Encounter (Signed)
Requested Prescriptions  Pending Prescriptions Disp Refills   lisinopril (ZESTRIL) 2.5 MG tablet 90 tablet 0    Sig: Take 1 tablet (2.5 mg total) by mouth daily.     Cardiovascular:  ACE Inhibitors Failed - 03/21/2021  3:33 PM      Failed - Cr in normal range and within 180 days    Creat  Date Value Ref Range Status  07/09/2015 0.43 (L) 0.50 - 1.10 mg/dL Final   Creatinine, Ser  Date Value Ref Range Status  01/26/2021 0.54 (L) 0.57 - 1.00 mg/dL Final   Creatinine, POC  Date Value Ref Range Status  05/21/2017 300 mg/dL Final   Creatinine, Urine  Date Value Ref Range Status  03/06/2016 37 20 - 320 mg/dL Final         Passed - K in normal range and within 180 days    Potassium  Date Value Ref Range Status  01/26/2021 4.6 3.5 - 5.2 mmol/L Final         Passed - Patient is not pregnant      Passed - Last BP in normal range    BP Readings from Last 1 Encounters:  01/26/21 129/84         Passed - Valid encounter within last 6 months    Recent Outpatient Visits          1 week ago Type 2 diabetes mellitus with hyperglycemia, without long-term current use of insulin [facility])   [place] [facility] [place], Jeannett Senior L, RPH-CPP   1 month ago Type 2 diabetes mellitus with hyperglycemia, without long-term current use of insulin [facility])   [place] [facility] [place], [place] W, NP   6 months ago Type 2 diabetes mellitus with hyperglycemia, without long-term current use of insulin [facility])   [place] [facility] [place], [place] W, NP   9 months ago Type 2 diabetes mellitus with hyperglycemia, without long-term current use of insulin [facility])   [place] [facility] [place], [place] W, NP   11 months ago Type 2 diabetes mellitus with hyperglycemia, without long-term current use of insulin [facility])   [place] [facility] [place], Shea Stakes, NP      Future Appointments            In 2 weeks  Lois Huxley, Cornelius Moras, RPH-CPP  [facility]            atorvastatin (LIPITOR) 20 MG tablet 90 tablet 0    Sig: Take 1 tablet (20 mg total) by mouth daily.     Cardiovascular:  Antilipid - Statins Failed - 03/21/2021  3:33 PM      Failed - Total Cholesterol in normal range and within 360 days    Cholesterol, Total  Date Value Ref Range Status  03/19/2020 106 100 - 199 mg/dL Final         Failed - LDL in normal range and within 360 days    LDL Chol Calc (NIH)  Date Value Ref Range Status  03/19/2020 40 0 - 99 mg/dL Final         Failed - HDL in normal range and within 360 days    HDL  Date Value Ref Range Status  03/19/2020 35 (L) >39 mg/dL Final         Failed - Triglycerides in normal range and within 360 days    Triglycerides  Date Value Ref Range  Status  03/19/2020 192 (H) 0 - 149 mg/dL Final         Passed - Patient is not pregnant      Passed - Valid encounter within last 12 months    Recent Outpatient Visits          1 week ago Type 2 diabetes mellitus with hyperglycemia, without long-term current use of insulin [facility])   [place] [facility] [place], Jeannett Senior L, RPH-CPP   1 month ago Type 2 diabetes mellitus with hyperglycemia, without long-term current use of insulin [facility])   [place] [facility] [place], [place] W, NP   6 months ago Type 2 diabetes mellitus with hyperglycemia, without long-term current use of insulin [facility])   [place] [facility] [place], [place] W, NP   9 months ago Type 2 diabetes mellitus with hyperglycemia, without long-term current use of insulin [facility])   [place] [facility] [place], [place] W, NP   11 months ago Type 2 diabetes mellitus with hyperglycemia, without long-term current use of insulin [facility])   [place] [facility] [place], Shea Stakes, NP      Future Appointments            In 2 weeks Lois Huxley,  Cornelius Moras, RPH-CPP [facility]

## 2021-03-24 ENCOUNTER — Other Ambulatory Visit: Payer: Self-pay

## 2021-04-05 NOTE — Progress Notes (Signed)
S:    PCP: Zelda  Patient presents for diabetes evaluation, education, and management. Patient was referred and last seen by Primary Care Provider on 01/26/21 at which time A1c was elevated from previous so Trulicity and glipizide were increased. Last seen by CPP 03/09/21 and we increased Trulicity to max dose.   Today, patient arrives in good spirits. She has used the increased dose of Trulicity for 3 weeks now and denies any adverse effects or issues with this dose change. She has an appointment with bariatrics at the end of the month to consider bariatric surgery.   Patient reports Diabetes was diagnosed >10 years ago (during her second pregnancy).   Family/Social History:  -Fhx: DM in mother and maternal grandmother -Tobacco: never smoker  [employer] affordability: Medicaid  Medication adherence reported.   Current diabetes medications include: metformin 1000 mg BID, Trulicity 4.5 mg weekly (Wednesdays), glipizide XL 10 mg daily (breakfast) Current hypertension medications include: lisinopril 2.5 mg daily Current hyperlipidemia medications include: atorvastatin 20 mg daily  Patient denies hypoglycemic events.  Patient reported dietary habits: Eats 2 meals/day (eats breakfast and lunch; nothing after 3pm). Avoids rice, pasta, bread.   Patient-reported exercise habits: exercising less, has appt with bariatrics at the end of the month   Patient reports nocturia (nighttime urination). 2x per night Patient reports neuropathy (nerve pain). Pinky fingers.  Patient denies visual changes. Patient reports self foot exams.   O:  POCT BG: 159 (3h post-prandial)  Lab Results  Component Value Date   HGBA1C 9.2 (A) 01/26/2021   There were no vitals filed for this visit.  Lipid Panel     Component Value Date/Time   CHOL 106 03/19/2020 1035   TRIG 192 (H) 03/19/2020 1035   HDL 35 (L) 03/19/2020 1035   CHOLHDL 3.0 03/19/2020 1035   LDLCALC 40 03/19/2020 1035    Home fasting blood sugars: 163, 155, 145, 149, 170 (highest), 111 (lowest) 2 hour post-meal/random blood sugars: 189, 200, 210  Clinical Atherosclerotic Cardiovascular Disease (ASCVD): No  The ASCVD Risk score (Arnett DK, et al., 2019) failed to calculate for the following reasons:   The valid total cholesterol range is 130 to 320 mg/dL   A/P:  Diabetes longstanding currently uncontrolled with most recent A1c 9.2 (01/26/21), up from 8.0 earlier this year. Reported home BG and BG in office today is improved from last visit after increasing Trulicity to max dose. Home BG still above goal though. Medication adherence appears appropriate. Patient is able to verbalize appropriate hypoglycemia management plan. Will start SGLT2i today, knowing that if she has bariatric surgery in the future and not able to eat/drink much, this will need to be held at that time. She will need adjustment of most medications at that time but we will wait to see if she is a candidate for surgery and when that is planned.  -Start Jardiance 10 mg daily. Counseled patient on sick day rules for Jardiance.  -Continue Trulicity 4.5 mg weekly, metformin 1000 mg BID and glipizide XL 10 mg daily  -Instructed patient to let Zelda and [place] know if she ends up planning bariatric surgery because her medications will need adjustment.  -Extensively discussed pathophysiology of diabetes, recommended lifestyle interventions, dietary effects on blood sugar control  -Counseled on s/sx of and management of hypoglycemia -Next A1C anticipated at next PCP visit.   Total time in face to face counseling 25 minutes. Follow up with PCP in beginning of February for next A1c.   [employer]  Ophelia Charter, PharmD PGY2 Ambulatory Care Pharmacy Resident 04/05/2021 5:32 PM

## 2021-04-06 ENCOUNTER — Other Ambulatory Visit: Payer: Self-pay

## 2021-04-06 ENCOUNTER — Ambulatory Visit: Payer: Medicaid Other | Attending: Family Medicine | Admitting: Pharmacist

## 2021-04-06 DIAGNOSIS — E1165 Type 2 diabetes mellitus with hyperglycemia: Secondary | ICD-10-CM | POA: Diagnosis not present

## 2021-04-06 LAB — GLUCOSE, POCT (MANUAL RESULT ENTRY): POC Glucose: 159 mg/dl — AB (ref 70–99)

## 2021-04-06 MED ORDER — EMPAGLIFLOZIN 10 MG PO TABS: 10.0000 mg | ORAL_TABLET | Freq: Every day | ORAL | 2 refills | Status: DC

## 2021-04-11 ENCOUNTER — Other Ambulatory Visit: Payer: Self-pay

## 2021-04-18 ENCOUNTER — Other Ambulatory Visit: Payer: Self-pay

## 2021-04-21 ENCOUNTER — Other Ambulatory Visit: Payer: Self-pay

## 2021-04-21 ENCOUNTER — Telehealth: Payer: Self-pay

## 2021-04-21 NOTE — Telephone Encounter (Signed)
PA for [place] is approved until 04/21/22

## 2021-04-23 ENCOUNTER — Other Ambulatory Visit: Payer: Self-pay | Admitting: Nurse Practitioner

## 2021-04-23 DIAGNOSIS — E1165 Type 2 diabetes mellitus with hyperglycemia: Secondary | ICD-10-CM

## 2021-04-23 DIAGNOSIS — E785 Hyperlipidemia, unspecified: Secondary | ICD-10-CM

## 2021-04-23 MED ORDER — LISINOPRIL 2.5 MG PO TABS
2.5000 mg | ORAL_TABLET | Freq: Every day | ORAL | 0 refills | Status: DC
  Filled 2021-04-23 – 2021-05-24 (×2): qty 90, 90d supply, fill #0

## 2021-04-23 NOTE — Telephone Encounter (Signed)
Requested medication (s) are due for refill today: yes  Requested medication (s) are on the active medication list: yes  Last refill:  atorvastatin: 01/24/21 #90        glipizide: 05/01/21 #90  Future visit scheduled: with pharmacist  Notes to clinic:  needs lab work   Requested Prescriptions  Pending Prescriptions Disp Refills   atorvastatin (LIPITOR) 20 MG tablet 90 tablet     Sig: Take 1 tablet (20 mg total) by mouth daily.     Cardiovascular:  Antilipid - Statins Failed - 04/23/2021  8:53 AM      Failed - Total Cholesterol in normal range and within 360 days    Cholesterol, Total  Date Value Ref Range Status  03/19/2020 106 100 - 199 mg/dL Final          Failed - LDL in normal range and within 360 days    LDL Chol Calc (NIH)  Date Value Ref Range Status  03/19/2020 40 0 - 99 mg/dL Final          Failed - HDL in normal range and within 360 days    HDL  Date Value Ref Range Status  03/19/2020 35 (L) >39 mg/dL Final          Failed - Triglycerides in normal range and within 360 days    Triglycerides  Date Value Ref Range Status  03/19/2020 192 (H) 0 - 149 mg/dL Final          Passed - Patient is not pregnant      Passed - Valid encounter within last 12 months    Recent Outpatient Visits           2 weeks ago Type 2 diabetes mellitus with hyperglycemia, without long-term current use of insulin ([place])   [place], Annie Main L, RPH-CPP   1 month ago Type 2 diabetes mellitus with hyperglycemia, without long-term current use of insulin ([place])   [place], Jarome Matin, RPH-CPP   2 months ago Type 2 diabetes mellitus with hyperglycemia, without long-term current use of insulin ([place])   [place], [place] W, NP   7 months ago Type 2 diabetes mellitus with hyperglycemia, without long-term current use of insulin ([place])   [place], Vernia Buff, NP   10 months ago Type 2 diabetes mellitus with hyperglycemia, without long-term current use of insulin ([place])   [place], Vernia Buff, NP       Future Appointments             In 2 weeks Daisy Blossom, Jarome Matin, [place]             glipiZIDE (GLIPIZIDE XL) 10 MG 24 hr tablet 90 tablet     Sig: Take 1 tablet (10 mg total) by mouth daily with breakfast.     Endocrinology:  Diabetes - Sulfonylureas Failed - 04/23/2021  8:53 AM      Failed - HBA1C is between 0 and 7.9 and within 180 days    HbA1c, POC (controlled diabetic range)  Date Value Ref Range Status  01/26/2021 9.2 (A) 0.0 - 7.0 % Final          Passed - Valid encounter within last 6 months    Recent Outpatient Visits  2 weeks ago Type 2 diabetes mellitus with hyperglycemia, without long-term current use of insulin [facility])   [place], Annie Main L, RPH-CPP   1 month ago Type 2 diabetes mellitus with hyperglycemia, without long-term current use of insulin [facility])   [place], Annie Main L, RPH-CPP   2 months ago Type 2 diabetes mellitus with hyperglycemia, without long-term current use of insulin [facility])   [place], [place] W, NP   7 months ago Type 2 diabetes mellitus with hyperglycemia, without long-term current use of insulin [facility] [place])   [place], [place] W, NP   10 months ago Type 2 diabetes mellitus with hyperglycemia, without long-term current use of insulin [facility])   [place], Vernia Buff, NP       Future Appointments             In 2 weeks Tresa Endo, [place]            Signed Prescriptions Disp Refills   lisinopril (ZESTRIL) 2.5 MG tablet 90 tablet 0     Sig: Take 1 tablet (2.5 mg total) by mouth daily.     Cardiovascular:  ACE Inhibitors Failed - 04/23/2021  8:53 AM      Failed - Cr in normal range and within 180 days    Creat  Date Value Ref Range Status  07/09/2015 0.43 (L) 0.50 - 1.10 mg/dL Final   Creatinine, Ser  Date Value Ref Range Status  01/26/2021 0.54 (L) 0.57 - 1.00 mg/dL Final   Creatinine, POC  Date Value Ref Range Status  05/21/2017 300 mg/dL Final   Creatinine, Urine  Date Value Ref Range Status  03/06/2016 37 20 - 320 mg/dL Final          Passed - K in normal range and within 180 days    Potassium  Date Value Ref Range Status  01/26/2021 4.6 3.5 - 5.2 mmol/L Final          Passed - Patient is not pregnant      Passed - Last BP in normal range    BP Readings from Last 1 Encounters:  01/26/21 129/84          Passed - Valid encounter within last 6 months    Recent Outpatient Visits           2 weeks ago Type 2 diabetes mellitus with hyperglycemia, without long-term current use of insulin [facility])   [place], Annie Main L, RPH-CPP   1 month ago Type 2 diabetes mellitus with hyperglycemia, without long-term current use of insulin [facility])   [place], Annie Main L, RPH-CPP   2 months ago Type 2 diabetes mellitus with hyperglycemia, without long-term current use of insulin [facility])   [place], [place] W, NP   7 months ago Type 2 diabetes mellitus with hyperglycemia, without long-term current use of insulin [facility])   [place], [place] W, NP   10 months ago Type 2 diabetes mellitus with hyperglycemia, without long-term current use of insulin [facility])   [place], Vernia Buff, NP       Future Appointments  In 2 weeks Daisy Blossom, Jarome Matin, [place]

## 2021-04-23 NOTE — Telephone Encounter (Signed)
Requested Prescriptions  Pending Prescriptions Disp Refills   atorvastatin (LIPITOR) 20 MG tablet 90 tablet     Sig: Take 1 tablet (20 mg total) by mouth daily.     Cardiovascular:  Antilipid - Statins Failed - 04/23/2021  8:53 AM      Failed - Total Cholesterol in normal range and within 360 days    Cholesterol, Total  Date Value Ref Range Status  03/19/2020 106 100 - 199 mg/dL Final         Failed - LDL in normal range and within 360 days    LDL Chol Calc (NIH)  Date Value Ref Range Status  03/19/2020 40 0 - 99 mg/dL Final         Failed - HDL in normal range and within 360 days    HDL  Date Value Ref Range Status  03/19/2020 35 (L) >39 mg/dL Final         Failed - Triglycerides in normal range and within 360 days    Triglycerides  Date Value Ref Range Status  03/19/2020 192 (H) 0 - 149 mg/dL Final         Passed - Patient is not pregnant      Passed - Valid encounter within last 12 months    Recent Outpatient Visits          2 weeks ago Type 2 diabetes mellitus with hyperglycemia, without long-term current use of insulin (HCC)   [place] [facility] [place], Stephen L, RPH-CPP   1 month ago Type 2 diabetes mellitus with hyperglycemia, without long-term current use of insulin [facility])   [place] [facility] [place], Jeannett Senior L, RPH-CPP   2 months ago Type 2 diabetes mellitus with hyperglycemia, without long-term current use of insulin [facility])   [place] [facility] [place], [place] W, NP   7 months ago Type 2 diabetes mellitus with hyperglycemia, without long-term current use of insulin [facility])   [place] [facility] [place], [place] W, NP   10 months ago Type 2 diabetes mellitus with hyperglycemia, without long-term current use of insulin [facility])   [place] [facility] [place], Shea Stakes, NP      Future Appointments            In 2 weeks Lois Huxley, Cornelius Moras, RPH-CPP  [place] [facility]            lisinopril (ZESTRIL) 2.5 MG tablet 90 tablet 0    Sig: Take 1 tablet (2.5 mg total) by mouth daily.     Cardiovascular:  ACE Inhibitors Failed - 04/23/2021  8:53 AM      Failed - Cr in normal range and within 180 days    Creat  Date Value Ref Range Status  07/09/2015 0.43 (L) 0.50 - 1.10 mg/dL Final   Creatinine, Ser  Date Value Ref Range Status  01/26/2021 0.54 (L) 0.57 - 1.00 mg/dL Final   Creatinine, POC  Date Value Ref Range Status  05/21/2017 300 mg/dL Final   Creatinine, Urine  Date Value Ref Range Status  03/06/2016 37 20 - 320 mg/dL Final         Passed - K in normal range and within 180 days    Potassium  Date Value Ref Range Status  01/26/2021 4.6 3.5 - 5.2 mmol/L Final         Passed - Patient is not pregnant  Passed - Last BP in normal range    BP Readings from Last 1 Encounters:  01/26/21 129/84         Passed - Valid encounter within last 6 months    Recent Outpatient Visits          2 weeks ago Type 2 diabetes mellitus with hyperglycemia, without long-term current use of insulin (HCC)   [place] [facility] [place], Cornelius Moras, RPH-CPP   1 month ago Type 2 diabetes mellitus with hyperglycemia, without long-term current use of insulin (HCC)   [place] [facility] [place], Cornelius Moras, RPH-CPP   2 months ago Type 2 diabetes mellitus with hyperglycemia, without long-term current use of insulin (HCC)   [place] [facility] [place], Shea Stakes, NP   7 months ago Type 2 diabetes mellitus with hyperglycemia, without long-term current use of insulin (HCC)   [facility] [place], Shea Stakes, NP   10 months ago Type 2 diabetes mellitus with hyperglycemia, without long-term current use of insulin (HCC)   [place] [facility] Well[facility] [place], Shea Stakes, NP      Future Appointments            In 2  weeks Lois Huxley, Cornelius Moras, RPH-CPP [place] [facility]            glipiZIDE (GLIPIZIDE XL) 10 MG 24 hr tablet 90 tablet     Sig: Take 1 tablet (10 mg total) by mouth daily with breakfast.     Endocrinology:  Diabetes - Sulfonylureas Failed - 04/23/2021  8:53 AM      Failed - HBA1C is between 0 and 7.9 and within 180 days    HbA1c, POC (controlled diabetic range)  Date Value Ref Range Status  01/26/2021 9.2 (A) 0.0 - 7.0 % Final         Passed - Valid encounter within last 6 months    Recent Outpatient Visits          2 weeks ago Type 2 diabetes mellitus with hyperglycemia, without long-term current use of insulin [facility])   [place] [facility] [place], Jeannett Senior L, RPH-CPP   1 month ago Type 2 diabetes mellitus with hyperglycemia, without long-term current use of insulin [facility])   [place] [facility] [place], Jeannett Senior L, RPH-CPP   2 months ago Type 2 diabetes mellitus with hyperglycemia, without long-term current use of insulin [facility])   [place] [facility]ness [place], [place] W, NP   7 months ago Type 2 diabetes mellitus with hyperglycemia, without long-term current use of insulin [facility])   [place] [facility] [place], [place] W, NP   10 months ago Type 2 diabetes mellitus with hyperglycemia, without long-term current use of insulin [facility])   [place] [facility] [place], Shea Stakes, NP      Future Appointments            In 2 weeks Lois Huxley, Cornelius Moras, RPH-CPP [facility]

## 2021-04-25 ENCOUNTER — Other Ambulatory Visit: Payer: Self-pay

## 2021-04-25 MED ORDER — ATORVASTATIN CALCIUM 20 MG PO TABS
20.0000 mg | ORAL_TABLET | Freq: Every day | ORAL | 2 refills | Status: DC
  Filled 2021-04-25 – 2021-05-24 (×2): qty 30, 30d supply, fill #0
  Filled 2021-07-20: qty 30, 30d supply, fill #1
  Filled 2021-09-06: qty 30, 30d supply, fill #2

## 2021-04-25 MED ORDER — GLIPIZIDE ER 10 MG PO TB24
10.0000 mg | ORAL_TABLET | Freq: Every day | ORAL | 2 refills | Status: DC
  Filled 2021-04-25: qty 30, 30d supply, fill #0
  Filled 2021-05-24: qty 30, 30d supply, fill #1
  Filled 2021-06-30: qty 30, 30d supply, fill #2

## 2021-04-26 ENCOUNTER — Other Ambulatory Visit: Payer: Self-pay

## 2021-04-26 MED ORDER — TRULICITY 4.5 MG/0.5ML ~~LOC~~ SOAJ
SUBCUTANEOUS | 0 refills | Status: DC
  Filled 2021-04-26: qty 2, 28d supply, fill #0

## 2021-04-27 ENCOUNTER — Other Ambulatory Visit: Payer: Self-pay

## 2021-05-05 NOTE — Progress Notes (Signed)
° °  S:    PCP: Zelda  Patient presents for diabetes evaluation, education, and management. Patient was referred and last seen by Primary Care Provider on 01/26/21 at which time A1c was elevated from previous so Trulicity and glipizide were increased. In the last two CPP visits, Trulicity was increased and Jardiance initiated.   Today, patient arrives in good spirits. Plan per last visit was to follow up with her PCP next but it was scheduled with CPP instead. Due for A1c today. Reports she started Jardiance just 4 days ago due to waiting on the prior authorization. No AE or issues with Jardiance thus far.   Patient reports Diabetes was diagnosed >10 years ago (during her second pregnancy).   Family/Social History:  -Fhx: DM in mother and maternal grandmother -Tobacco: never smoker  Human resources officer affordability: Medicaid  Medication adherence reported.   Current diabetes medications include: metformin 0881 mg BID, Trulicity 4.5 mg weekly (Wednesdays), glipizide XL 10 mg daily (breakfast), Jardiance 10 mg daily Current hypertension medications include: lisinopril 2.5 mg daily Current hyperlipidemia medications include: atorvastatin 20 mg daily  Patient denies hypoglycemic events.  Patient reported dietary habits: Eats 2 meals/day (eats breakfast and lunch; nothing after 3pm). Mostly avoids rice, pasta, bread, fries. Eats salads, protein, yogurt.   Patient-reported exercise habits: active around house but not exercising formally, limited by knee pain  Patient reports nocturia (nighttime urination). 2x per night  Patient reports neuropathy (nerve pain). Pinky fingers.  Patient denies visual changes. Patient reports self foot exams.   O:   Lab Results  Component Value Date   HGBA1C 9.2 (A) 01/26/2021   Lipid Panel     Component Value Date/Time   CHOL 106 03/19/2020 1035   TRIG 192 (H) 03/19/2020 1035   HDL 35 (L) 03/19/2020 1035   CHOLHDL 3.0 03/19/2020 1035    LDLCALC 40 03/19/2020 1035   Home fasting blood sugars: 131, 152; 181 (highest);  131 (lowest) 2 hour post-meal/random blood sugars: 169, 150  Clinical Atherosclerotic Cardiovascular Disease (ASCVD): No  The ASCVD Risk score (Arnett DK, et al., 2019) failed to calculate for the following reasons:   The valid total cholesterol range is 130 to 320 mg/dL   A/P:  Diabetes longstanding currently uncontrolled with most recent A1c 9.2 (01/26/21), up from 8.0 earlier this year. Home BG still above goal. Medication adherence is appropriate. Patient is able to verbalize appropriate hypoglycemia management plan. Given that she just started Jardiance four days ago, will continue current medications and get updated lab work.  -Continue Jardiance 10 mg daily, Trulicity 4.5 mg weekly, metformin 1000 mg BID and glipizide XL 10 mg daily  -Instructed patient to let Zelda and [place] know if she ends up planning bariatric surgery because her medications will need adjustment.  -Extensively discussed pathophysiology of diabetes, recommended lifestyle interventions, dietary effects on blood sugar control  -Counseled on s/sx of and management of hypoglycemia -Check A1c, lipid panel, CMP14+eGFR, UACR  Total time in face to face counseling 20 minutes. Follow up with PCP next available.   Rebbeca Paul, PharmD PGY2 Ambulatory Care Pharmacy Resident 05/05/2021 6:40 PM

## 2021-05-09 ENCOUNTER — Ambulatory Visit: Payer: Medicaid Other | Attending: Nurse Practitioner | Admitting: Pharmacist

## 2021-05-09 ENCOUNTER — Encounter: Payer: Self-pay | Admitting: Pharmacist

## 2021-05-09 ENCOUNTER — Other Ambulatory Visit: Payer: Self-pay

## 2021-05-09 DIAGNOSIS — E785 Hyperlipidemia, unspecified: Secondary | ICD-10-CM

## 2021-05-09 DIAGNOSIS — E1165 Type 2 diabetes mellitus with hyperglycemia: Secondary | ICD-10-CM

## 2021-05-10 LAB — CMP14+EGFR
ALT: 75 IU/L — ABNORMAL HIGH (ref 0–32)
AST: 64 IU/L — ABNORMAL HIGH (ref 0–40)
Albumin/Globulin Ratio: 1.5 (ref 1.2–2.2)
Albumin: 4.6 g/dL (ref 3.8–4.8)
Alkaline Phosphatase: 98 IU/L (ref 44–121)
BUN/Creatinine Ratio: 9 (ref 9–23)
BUN: 6 mg/dL (ref 6–24)
Bilirubin Total: 0.3 mg/dL (ref 0.0–1.2)
CO2: 24 mmol/L (ref 20–29)
Calcium: 10 mg/dL (ref 8.7–10.2)
Chloride: 97 mmol/L (ref 96–106)
Creatinine, Ser: 0.64 mg/dL (ref 0.57–1.00)
Globulin, Total: 3.1 g/dL (ref 1.5–4.5)
Glucose: 127 mg/dL — ABNORMAL HIGH (ref 70–99)
Potassium: 4.4 mmol/L (ref 3.5–5.2)
Sodium: 139 mmol/L (ref 134–144)
Total Protein: 7.7 g/dL (ref 6.0–8.5)
eGFR: 109 mL/min/{1.73_m2} (ref >59)

## 2021-05-10 LAB — MICROALBUMIN / CREATININE URINE RATIO
Creatinine, Urine: 15.2 mg/dL
Microalb/Creat Ratio: <20 mg/g creat (ref 0–29)
Microalbumin, Urine: <3 ug/mL

## 2021-05-10 LAB — LIPID PANEL
Chol/HDL Ratio: 2.9 ratio (ref 0.0–4.4)
Cholesterol, Total: 115 mg/dL (ref 100–199)
HDL: 39 mg/dL — ABNORMAL LOW (ref >39)
LDL Chol Calc (NIH): 55 mg/dL (ref 0–99)
Triglycerides: 114 mg/dL (ref 0–149)
VLDL Cholesterol Cal: 21 mg/dL (ref 5–40)

## 2021-05-10 LAB — HEMOGLOBIN A1C
Est. average glucose Bld gHb Est-mCnc: 223 mg/dL
Hgb A1c MFr Bld: 9.4 % — ABNORMAL HIGH (ref 4.8–5.6)

## 2021-05-19 ENCOUNTER — Other Ambulatory Visit: Payer: Self-pay | Admitting: Nurse Practitioner

## 2021-05-19 MED ORDER — LANTUS SOLOSTAR 100 UNIT/ML ~~LOC~~ SOPN
20.0000 [IU] | PEN_INJECTOR | Freq: Every day | SUBCUTANEOUS | 99 refills | Status: DC
  Filled 2021-05-19: qty 15, 75d supply, fill #0
  Filled 2021-06-30 – 2021-07-24 (×5): qty 15, 75d supply, fill #1
  Filled 2021-09-06 – 2021-09-19 (×2): qty 15, 75d supply, fill #2
  Filled 2021-11-04 – 2021-11-15 (×2): qty 15, 75d supply, fill #3

## 2021-05-20 ENCOUNTER — Other Ambulatory Visit: Payer: Self-pay

## 2021-05-20 ENCOUNTER — Telehealth: Payer: Self-pay

## 2021-05-20 NOTE — Telephone Encounter (Signed)
Pt was called and vm was left, Information has been sent to nurse pool.   

## 2021-05-20 NOTE — Telephone Encounter (Signed)
-----   Message from Claiborne Rigg, NP sent at 05/19/2021 10:12 PM EST ----- A1c 9.4. Insulin pen sent to pharmacy. She will need to administer this at night.   Urine does not show microscopic diabetic kidney changes.   Cholesterol levels at goal. Continue all medications as prescribed.   Kidney function normal. Liver enzymes elevated. Keep diet low in fat and cholesterol. Try to lose 10lbs over the next 3 months.

## 2021-05-24 ENCOUNTER — Other Ambulatory Visit: Payer: Self-pay | Admitting: Family Medicine

## 2021-05-24 ENCOUNTER — Other Ambulatory Visit: Payer: Self-pay

## 2021-05-24 DIAGNOSIS — E119 Type 2 diabetes mellitus without complications: Secondary | ICD-10-CM

## 2021-05-25 ENCOUNTER — Other Ambulatory Visit: Payer: Self-pay

## 2021-05-25 MED ORDER — ACCU-CHEK GUIDE W/DEVICE KIT
1.0000 | PACK | Freq: Two times a day (BID) | 0 refills | Status: DC
  Filled 2021-05-25: qty 1, 30d supply, fill #0

## 2021-05-26 ENCOUNTER — Other Ambulatory Visit: Payer: Self-pay

## 2021-05-27 ENCOUNTER — Other Ambulatory Visit: Payer: Self-pay

## 2021-05-31 ENCOUNTER — Other Ambulatory Visit: Payer: Self-pay

## 2021-06-30 ENCOUNTER — Other Ambulatory Visit: Payer: Self-pay

## 2021-07-01 ENCOUNTER — Other Ambulatory Visit: Payer: Self-pay

## 2021-07-14 ENCOUNTER — Other Ambulatory Visit: Payer: Self-pay | Admitting: Family Medicine

## 2021-07-14 ENCOUNTER — Other Ambulatory Visit: Payer: Self-pay

## 2021-07-15 ENCOUNTER — Ambulatory Visit: Payer: Medicaid Other | Admitting: Nurse Practitioner

## 2021-07-15 ENCOUNTER — Other Ambulatory Visit: Payer: Self-pay

## 2021-07-15 MED ORDER — TRULICITY 4.5 MG/0.5ML ~~LOC~~ SOAJ
4.5000 mg | SUBCUTANEOUS | 3 refills | Status: DC
Start: 2021-07-15 — End: 2021-12-21
  Filled 2021-07-15 – 2021-07-24 (×3): qty 2, 28d supply, fill #0
  Filled 2021-09-06: qty 2, 28d supply, fill #1
  Filled 2021-10-08: qty 2, 28d supply, fill #2
  Filled 2021-11-04: qty 2, 28d supply, fill #3

## 2021-07-20 ENCOUNTER — Other Ambulatory Visit: Payer: Self-pay

## 2021-07-22 ENCOUNTER — Other Ambulatory Visit: Payer: Self-pay

## 2021-07-24 ENCOUNTER — Other Ambulatory Visit: Payer: Self-pay | Admitting: Family Medicine

## 2021-07-25 ENCOUNTER — Other Ambulatory Visit: Payer: Self-pay

## 2021-07-26 ENCOUNTER — Other Ambulatory Visit: Payer: Self-pay

## 2021-07-26 MED ORDER — GLIPIZIDE ER 10 MG PO TB24
10.0000 mg | ORAL_TABLET | Freq: Every day | ORAL | 2 refills | Status: DC
Start: 2021-07-26 — End: 2021-11-04
  Filled 2021-07-26 – 2021-08-03 (×2): qty 30, 30d supply, fill #0
  Filled 2021-09-06: qty 30, 30d supply, fill #1
  Filled 2021-10-08: qty 30, 30d supply, fill #2

## 2021-07-26 NOTE — Telephone Encounter (Signed)
Requested Prescriptions  ?Pending Prescriptions Disp Refills  ?? glipiZIDE (GLIPIZIDE XL) 10 MG 24 hr tablet 30 tablet 2  ?  Sig: Take 1 tablet (10 mg total) by mouth daily with breakfast.  ?  ? Endocrinology:  Diabetes - Sulfonylureas Failed - 07/24/2021  8:13 PM  ?  ?  Failed - HBA1C is between 0 and 7.9 and within 180 days  ?  HbA1c, POC (controlled diabetic range)  ?Date Value Ref Range Status  ?01/26/2021 9.2 (A) 0.0 - 7.0 % Final  ? ?Hgb A1c MFr Bld  ?Date Value Ref Range Status  ?05/09/2021 9.4 (H) 4.8 - 5.6 % Final  ?  Comment:  ?           Prediabetes: 5.7 - 6.4 ?         Diabetes: >6.4 ?         Glycemic control for adults with diabetes: <7.0 ?  ?   ?  ?  Passed - Cr in normal range and within 360 days  ?  Creat  ?Date Value Ref Range Status  ?07/09/2015 0.43 (L) 0.50 - 1.10 mg/dL Final  ? ?Creatinine, Ser  ?Date Value Ref Range Status  ?05/09/2021 0.64 0.57 - 1.00 mg/dL Final  ? ?Creatinine, POC  ?Date Value Ref Range Status  ?05/21/2017 300 mg/dL Final  ? ?Creatinine, Urine  ?Date Value Ref Range Status  ?03/06/2016 37 20 - 320 mg/dL Final  ?   ?  ?  Passed - Valid encounter within last 6 months  ?  Recent Outpatient Visits   ?      ? 2 months ago Type 2 diabetes mellitus with hyperglycemia, without long-term current use of insulin (HCC)  ? [facility] Lois Huxley, Cornelius Moras, RPH-CPP  ? 3 months ago Type 2 diabetes mellitus with hyperglycemia, without long-term current use of insulin (HCC)  ? [facility] Lois Huxley, Cornelius Moras, RPH-CPP  ? 4 months ago Type 2 diabetes mellitus with hyperglycemia, without long-term current use of insulin (HCC)  ? [facility] Well[facility] Lois Huxley, Cornelius Moras, RPH-CPP  ? 6 months ago Type 2 diabetes mellitus with hyperglycemia, without long-term current use of insulin (HCC)  ? [facility] [place], [place] W, NP  ? 10 months ago Type 2 diabetes mellitus with  hyperglycemia, without long-term current use of insulin (HCC)  ? [facility] Claiborne Rigg, NP  ?  ?  ?Future Appointments   ?        ? In 2 months Claiborne Rigg, NP [facility]  ?  ? ?  ?  ?  ? ? ?

## 2021-08-02 ENCOUNTER — Other Ambulatory Visit: Payer: Self-pay

## 2021-08-03 ENCOUNTER — Other Ambulatory Visit: Payer: Self-pay

## 2021-08-04 ENCOUNTER — Other Ambulatory Visit: Payer: Self-pay | Admitting: Pharmacist

## 2021-08-04 MED ORDER — EMPAGLIFLOZIN 10 MG PO TABS
10.0000 mg | ORAL_TABLET | Freq: Every day | ORAL | 2 refills | Status: DC
  Filled 2021-08-04: qty 30, 30d supply, fill #0
  Filled 2021-09-06: qty 30, 30d supply, fill #1

## 2021-08-05 ENCOUNTER — Encounter (HOSPITAL_COMMUNITY): Payer: Self-pay | Admitting: Emergency Medicine

## 2021-08-05 ENCOUNTER — Other Ambulatory Visit: Payer: Self-pay

## 2021-08-05 ENCOUNTER — Ambulatory Visit (HOSPITAL_COMMUNITY)
Admission: EM | Admit: 2021-08-05 | Discharge: 2021-08-05 | Disposition: A | Discharge status: Home / Self Care | Payer: Medicaid Other | Attending: Family Medicine | Admitting: Family Medicine

## 2021-08-05 DIAGNOSIS — N61 Mastitis without abscess: Secondary | ICD-10-CM

## 2021-08-05 DIAGNOSIS — E11628 Type 2 diabetes mellitus with other skin complications: Secondary | ICD-10-CM | POA: Diagnosis not present

## 2021-08-05 MED ORDER — NYSTATIN 100000 UNIT/GM EX POWD
1.0000 | Freq: Three times a day (TID) | CUTANEOUS | 0 refills | Status: DC | PRN
Start: 2021-08-05 — End: 2021-12-21

## 2021-08-05 MED ORDER — DOXYCYCLINE HYCLATE 100 MG PO CAPS: 100.0000 mg | ORAL_CAPSULE | Freq: Two times a day (BID) | ORAL | 0 refills | Status: DC

## 2021-08-05 NOTE — Discharge Instructions (Addendum)
After your infection resolves schedule your mammogram.- ?You may apply warm compresses to reduce tenderness and swelling. ?To prevent sweating under your breast I have prescribed you nystatin powder apply at least twice daily, ?

## 2021-08-05 NOTE — ED Provider Notes (Signed)
?[facility] ? ? ? ?CSN: [id number] ?Arrival date & time: 08/05/21  1904 ? ? ?  ? ?History   ?Chief Complaint ?Chief Complaint  ?Patient presents with  ? Breast Pain  ? Fever  ? Breast Mass  ? ? ?HPI ?Norma Chambers is a 51 y.o. female.  ? ?HPI ?Patient with a history of type 2 diabetes and morbid obesity presents today for evaluation of a rash under her right breast.  The rash itches and burns and has now expanded to her lower breast. ?She reports the rash has been there for approximately 2 days and appears to be worsening. ? ?Past Medical History:  ?Diagnosis Date  ? Gestational diabetes   ? Obesity   ? Type 2 diabetes mellitus with hyperglycemia, without long-term current use of insulin ([place])   ? ? ?Patient Active Problem List  ? Diagnosis Date Noted  ? Bell's palsy 12/23/2019  ? DM (diabetes mellitus) ([place]) 07/10/2013  ? Morbid obesity with BMI of 50.0-59.9, adult ([place]) 08/19/2012  ? ? ?Past Surgical History:  ?Procedure Laterality Date  ? CESAREAN SECTION    ? CESAREAN SECTION N/A 11/18/2012  ? Procedure: REPEAT CESAREAN SECTION;  Surgeon: Mora Bellman, MD;  Location: [facility];  Service: Obstetrics;  Laterality: N/A;  ? ? ?OB History   ? ? Gravida  ?5  ? Para  ?3  ? Term  ?3  ? Preterm  ?   ? AB  ?   ? Living  ?3  ?  ? ? SAB  ?   ? IAB  ?   ? Ectopic  ?   ? Multiple  ?   ? Live Births  ?1  ?   ?  ?  ? ? ? ?Home Medications   ? ?Prior to Admission medications   ?Medication Sig Start Date End Date Taking? Authorizing Provider  ?doxycycline (VIBRAMYCIN) 100 MG capsule Take 1 capsule (100 mg total) by mouth 2 (two) times daily. 08/05/21  Yes Scot Jun, FNP  ?nystatin (MYCOSTATIN/NYSTOP) powder Apply 1 application. topically 3 (three) times daily as needed. 08/05/21  Yes Scot Jun, FNP  ?Accu-Chek Softclix Lancets lancets Use to inject into the skin twice daily 09/08/20   Gildardo Pounds, NP  ?atorvastatin (LIPITOR) 20 MG tablet Take 1 tablet (20 mg total) by mouth daily. 04/25/21   Charlott Rakes,  MD  ?Blood Glucose Monitoring Suppl (ACCU-CHEK GUIDE) w/Device KIT Use as instructed. Check blood glucose level by fingerstick twice per day. 05/25/21   Gildardo Pounds, NP  ?Dulaglutide (TRULICITY) 4.5 NU/2.7OZ SOPN Inject 4.5 mg as directed once a week. 07/15/21   Charlott Rakes, MD  ?empagliflozin (JARDIANCE) 10 MG TABS tablet Take 1 tablet (10 mg total) by mouth daily before breakfast. 08/04/21   Charlott Rakes, MD  ?glipiZIDE (GLIPIZIDE XL) 10 MG 24 hr tablet Take 1 tablet (10 mg total) by mouth daily with breakfast. 07/26/21 10/24/21  Gildardo Pounds, NP  ?glucose blood (ACCU-CHEK GUIDE) test strip USE AS INSTRUCTED. CHECK BLOOD GLUCOSE LEVEL BY FINGERSTICK TWICE PER DAY. 09/08/20   Gildardo Pounds, NP  ?insulin glargine (LANTUS SOLOSTAR) 100 UNIT/ML Solostar Pen Inject 20 Units into the skin daily. 05/19/21   Gildardo Pounds, NP  ?Insulin Pen Needle (B-D UF III MINI PEN NEEDLES) 31G X 5 MM MISC Use as instructed. Inject into the skin once weekly. Please fill as a 90 day supply 09/08/20   Gildardo Pounds, NP  ?lisinopril (ZESTRIL) 2.5 MG  tablet Take 1 tablet (2.5 mg total) by mouth daily. 04/23/21 08/24/21  Gildardo Pounds, NP  ?metFORMIN (GLUCOPHAGE) 1000 MG tablet Take 1 tablet (1,000 mg total) by mouth 2 (two) times daily with a meal. 02/21/21 05/26/21  Charlott Rakes, MD  ?valACYclovir (VALTREX) 1000 MG tablet Take 1 tablet (1,000 mg total) by mouth 3 (three) times daily. ?Patient not taking: Reported on 01/26/2021 12/19/19   Antony Blackbird, MD  ? ? ?Family History ?Family History  ?Problem Relation Age of Onset  ? Diabetes Mother   ? Diabetes Maternal Grandmother   ? Bell's palsy Neg Hx   ? ? ?Social History ?Social History  ? ?Tobacco Use  ? Smoking status: Never  ? Smokeless tobacco: Never  ?Vaping Use  ? Vaping Use: Never used  ?Substance Use Topics  ? Alcohol use: No  ? Drug use: No  ? ? ? ?Allergies   ?Patient has no known allergies. ? ? ?Review of Systems ?Review of Systems ?Pertinent negatives listed in  HPI  ? ?Physical Exam ?Triage Vital Signs ?ED Triage Vitals  ?Enc Vitals Group  ?   BP 08/05/21 1941 123/65  ?   Pulse Rate 08/05/21 1941 (!) 106  ?   Resp 08/05/21 1941 17  ?   Temp 08/05/21 1942 98.9 ?F (37.2 ?C)  ?   Temp src --   ?   SpO2 08/05/21 1941 98 %  ?   Weight --   ?   Height --   ?   Head Circumference --   ?   Peak Flow --   ?   Pain Score 08/05/21 1941 7  ?   Pain Loc --   ?   Pain Edu? --   ?   Excl. in [place]? --   ? ?No data found. ? ?Updated Vital Signs ?BP 123/65   Pulse (!) 106   Temp 98.9 ?F (37.2 ?C)   Resp 17   SpO2 98%  ? ?Visual Acuity ?Right Eye Distance:   ?Left Eye Distance:   ?Bilateral Distance:   ? ?Right Eye Near:   ?Left Eye Near:    ?Bilateral Near:    ? ?Physical Exam ?Constitutional:   ?   Appearance: Normal appearance.  ?Cardiovascular:  ?   Rate and Rhythm: Normal rate and regular rhythm.  ?Pulmonary:  ?   Effort: Pulmonary effort is normal.  ?   Breath sounds: Normal breath sounds.  ?Chest:  ? ? ?Neurological:  ?   Mental Status: She is alert.  ?Psychiatric:     ?   Attention and Perception: Attention normal.     ?   Mood and Affect: Mood normal.     ?   Speech: Speech normal.  ? ? ? ?UC Treatments / Results  ?Labs ?(all labs ordered are listed, but only abnormal results are displayed) ?Labs Reviewed - No data to display ? ?EKG ? ? ?Radiology ?No results found. ? ?Procedures ?Procedures (including critical care time) ? ?Medications Ordered in UC ?Medications - No data to display ? ?Initial Impression / Assessment and Plan / UC Course  ?I have reviewed the triage vital signs and the nursing notes. ? ?Pertinent labs & imaging results that were available during my care of the patient were reviewed by me and considered in my medical decision making (see chart for details). ? ?  ?Advised patient to schedule routine mammogram.  I am treating empirically for cellulitis involving the lower right breast . Patient also  has increased moisture below both breast, prescribed nystatin  powder to be applied twice daily as needed under breast.  For treatment of infection prescribed doxycycline twice daily for 10 days.  Patient advised to return if symptoms worsen or do not improve. ? ?Final Clinical Impressions(s) / UC Diagnoses  ? ?Final diagnoses:  ?Cellulitis of right breast  ?Type 2 diabetes mellitus with other skin complication, unspecified whether long term insulin use ([place])  ? ? ? ?Discharge Instructions   ? ?  ?After your infection resolves schedule your mammogram.- ?You may apply warm compresses to reduce tenderness and swelling. ?To prevent sweating under your breast I have prescribed you nystatin powder apply at least twice daily, ? ? ? ? ?ED Prescriptions   ? ? Medication Sig Dispense Auth. Provider  ? nystatin (MYCOSTATIN/NYSTOP) powder Apply 1 application. topically 3 (three) times daily as needed. 90 g Scot Jun, FNP  ? doxycycline (VIBRAMYCIN) 100 MG capsule Take 1 capsule (100 mg total) by mouth 2 (two) times daily. 20 capsule Scot Jun, FNP  ? ?  ? ?PDMP not reviewed this encounter. ?  ?Scot Jun, FNP ?08/08/21 1419 ? ?

## 2021-08-05 NOTE — ED Triage Notes (Signed)
Pt is present today with concerns of a mass on her left breast ,pain and fever. Pt states she noticed it x2 days ago.  ?

## 2021-08-09 ENCOUNTER — Other Ambulatory Visit: Payer: Self-pay

## 2021-08-16 ENCOUNTER — Telehealth: Payer: Self-pay | Admitting: Nurse Practitioner

## 2021-08-16 NOTE — Telephone Encounter (Signed)
Patient has had a referral for mammogram in the system since last year. She can call the breast clinic to schedule her mammogram at any time.  ?

## 2021-08-16 NOTE — Telephone Encounter (Signed)
Pt husband came by office requesting a referral for pt to get a Mammogram. He stated his wife was recently seen in urgent care which the provider told her to speak to PCP about referral.  ? ?Pt requesting phone call. ?

## 2021-09-06 ENCOUNTER — Other Ambulatory Visit: Payer: Self-pay | Admitting: Nurse Practitioner

## 2021-09-06 ENCOUNTER — Other Ambulatory Visit: Payer: Self-pay

## 2021-09-06 DIAGNOSIS — E1165 Type 2 diabetes mellitus with hyperglycemia: Secondary | ICD-10-CM

## 2021-09-06 MED ORDER — LISINOPRIL 2.5 MG PO TABS
2.5000 mg | ORAL_TABLET | Freq: Every day | ORAL | 2 refills | Status: DC
  Filled 2021-09-06: qty 30, 30d supply, fill #0
  Filled 2021-10-08: qty 30, 30d supply, fill #1
  Filled 2021-11-04: qty 30, 30d supply, fill #2

## 2021-09-09 ENCOUNTER — Other Ambulatory Visit: Payer: Self-pay

## 2021-09-14 ENCOUNTER — Ambulatory Visit: Payer: Medicaid Other | Attending: Nurse Practitioner | Admitting: Nurse Practitioner

## 2021-09-14 ENCOUNTER — Other Ambulatory Visit: Payer: Self-pay

## 2021-09-14 ENCOUNTER — Encounter: Payer: Self-pay | Admitting: Nurse Practitioner

## 2021-09-14 VITALS — BP 127/83 | HR 78 | Temp 98.1°F | Resp 16 | Wt 282.6 lb

## 2021-09-14 DIAGNOSIS — E1165 Type 2 diabetes mellitus with hyperglycemia: Secondary | ICD-10-CM

## 2021-09-14 DIAGNOSIS — N63 Unspecified lump in unspecified breast: Secondary | ICD-10-CM

## 2021-09-14 DIAGNOSIS — L98499 Non-pressure chronic ulcer of skin of other sites with unspecified severity: Secondary | ICD-10-CM | POA: Diagnosis not present

## 2021-09-14 DIAGNOSIS — E785 Hyperlipidemia, unspecified: Secondary | ICD-10-CM

## 2021-09-14 DIAGNOSIS — Z1211 Encounter for screening for malignant neoplasm of colon: Secondary | ICD-10-CM | POA: Diagnosis not present

## 2021-09-14 LAB — GLUCOSE, POCT (MANUAL RESULT ENTRY): POC Glucose: 129 mg/dl — AB (ref 70–99)

## 2021-09-14 MED ORDER — MUPIROCIN 2 % EX OINT
1.0000 "application " | TOPICAL_OINTMENT | Freq: Two times a day (BID) | CUTANEOUS | 0 refills | Status: DC
  Filled 2021-09-14: qty 22, 11d supply, fill #0
  Filled 2021-09-20 – 2021-09-24 (×2): qty 22, 11d supply, fill #1
  Filled 2021-10-08 – 2021-11-04 (×2): qty 22, 11d supply, fill #2

## 2021-09-14 NOTE — Progress Notes (Signed)
Patient c/o infection under her breast. Patient was seen at [facility] and given ABX and nystatin. Patient is currently still having bleeding and is changing her bandage tid

## 2021-09-14 NOTE — Progress Notes (Signed)
Assessment & Plan:  Norma Chambers was seen today for follow-up.  Diagnoses and all orders for this visit:  Skin ulcer of female breast ([place]) MAMMOGRAM ORDERED STAT and scheduled for tomorrow am -     Cancel: MM Digital Diagnostic Bilat; Future -     Cancel: US BREAST COMPLETE UNI RIGHT INC AXILLA; Future -     MM DIAG BREAST TOMO BILATERAL; Future -     US BREAST LTD UNI RIGHT INC AXILLA; Future -     AMB referral to wound care center -     mupirocin ointment (BACTROBAN) 2 %; Apply 1 application  topically 2 (two) times daily. -     CBC with Differential  Breast mass in female -     Cancel: MM Digital Diagnostic Bilat; Future -     Cancel: US BREAST COMPLETE UNI RIGHT INC AXILLA; Future -     MM DIAG BREAST TOMO BILATERAL; Future -     US BREAST LTD UNI RIGHT INC AXILLA; Future  Dyslipidemia, goal LDL below 70 -     Lipid panel  Colon cancer screening -     Ambulatory referral to Gastroenterology  Type 2 diabetes mellitus with hyperglycemia, without long-term current use of insulin (HCC) -     POCT glucose (manual entry) -     CMP14+EGFR -     Hemoglobin A1c    Patient has been counseled on age-appropriate routine health concerns for screening and prevention. These are reviewed and up-to-date. Referrals have been placed accordingly. Immunizations are up-to-date or declined.    Subjective:   Chief Complaint  Patient presents with   Follow-up    HFU   HPI Norma Chambers 51 y.o. female presents to office today for HFU to right breast cellulitis   HFU Initially started off as an itchy red rash under breast then progressed to ulceration of skin. Initial onset was around May 1st. Unfortunately her mammogram which was ordered last June 2022 was never completed. She was treated empirically for cellulitis of the lower right breast with doxycycline and nystatin powder. There  is no family history of breast cancer.    Review of Systems  Constitutional:  Negative for fever,  malaise/fatigue and weight loss.  HENT: Negative.  Negative for nosebleeds.   Eyes: Negative.  Negative for blurred vision, double vision and photophobia.  Respiratory: Negative.  Negative for cough and shortness of breath.   Cardiovascular: Negative.  Negative for chest pain, palpitations and leg swelling.  Gastrointestinal: Negative.  Negative for heartburn, nausea and vomiting.  Musculoskeletal: Negative.  Negative for myalgias.  Skin:        SEE HPI  Neurological: Negative.  Negative for dizziness, focal weakness, seizures and headaches.  Psychiatric/Behavioral: Negative.  Negative for suicidal ideas.     Past Medical History:  Diagnosis Date   Gestational diabetes    Obesity    Type 2 diabetes mellitus with hyperglycemia, without long-term current use of insulin ([place])     Past Surgical History:  Procedure Laterality Date   CESAREAN SECTION     CESAREAN SECTION N/A 11/18/2012   Procedure: REPEAT CESAREAN SECTION;  Surgeon: Mora Bellman, MD;  Location: [facility];  Service: Obstetrics;  Laterality: N/A;    Family History  Problem Relation Age of Onset   Diabetes Mother    Diabetes Maternal Grandmother    Bell's palsy Neg Hx     Social History Reviewed with no changes to be made today.   Outpatient Medications  Prior to Visit  Medication Sig Dispense Refill   Accu-Chek Softclix Lancets lancets Use to inject into the skin twice daily 200 each 3   atorvastatin (LIPITOR) 20 MG tablet Take 1 tablet (20 mg total) by mouth daily. 30 tablet 2   Blood Glucose Monitoring Suppl (ACCU-CHEK GUIDE) w/Device KIT Use as instructed. Check blood glucose level by fingerstick twice per day. 1 kit 0   Dulaglutide (TRULICITY) 4.5 WF/0.9NA SOPN Inject 4.5 mg as directed once a week. 2 mL 3   empagliflozin (JARDIANCE) 10 MG TABS tablet Take 1 tablet (10 mg total) by mouth daily before breakfast. 30 tablet 2   glipiZIDE (GLIPIZIDE XL) 10 MG 24 hr tablet Take 1 tablet (10 mg total) by mouth daily  with breakfast. 30 tablet 2   glucose blood (ACCU-CHEK GUIDE) test strip USE AS INSTRUCTED. CHECK BLOOD GLUCOSE LEVEL BY FINGERSTICK TWICE PER DAY. 200 strip 11   insulin glargine (LANTUS SOLOSTAR) 100 UNIT/ML Solostar Pen Inject 20 Units into the skin daily. 15 mL PRN   Insulin Pen Needle (B-D UF III MINI PEN NEEDLES) 31G X 5 MM MISC Use as instructed. Inject into the skin once weekly. Please fill as a 90 day supply 100 each 3   lisinopril (ZESTRIL) 2.5 MG tablet Take 1 tablet (2.5 mg total) by mouth daily. 30 tablet 2   nystatin (MYCOSTATIN/NYSTOP) powder Apply 1 application. topically 3 (three) times daily as needed. 90 g 0   doxycycline (VIBRAMYCIN) 100 MG capsule Take 1 capsule (100 mg total) by mouth 2 (two) times daily. (Patient not taking: Reported on 09/14/2021) 20 capsule 0   metFORMIN (GLUCOPHAGE) 1000 MG tablet Take 1 tablet (1,000 mg total) by mouth 2 (two) times daily with a meal. 60 tablet 2   valACYclovir (VALTREX) 1000 MG tablet Take 1 tablet (1,000 mg total) by mouth 3 (three) times daily. (Patient not taking: Reported on 01/26/2021) 21 tablet 0   No facility-administered medications prior to visit.    No Known Allergies     Objective:    BP 127/83 (BP Location: Left Wrist, Patient Position: Sitting, Cuff Size: Large)   Pulse 78   Temp 98.1 F (36.7 C) (Oral)   Resp 16   Wt 282 lb 9.6 oz (128.2 kg)   SpO2 96%   BMI 50.06 kg/m  Wt Readings from Last 3 Encounters:  09/14/21 282 lb 9.6 oz (128.2 kg)  01/26/21 286 lb 12.8 oz (130.1 kg)  03/19/20 285 lb 3.2 oz (129.4 kg)    Physical Exam Vitals and nursing note reviewed.  Constitutional:      Appearance: She is well-developed.  HENT:     Head: Normocephalic and atraumatic.  Cardiovascular:     Rate and Rhythm: Normal rate and regular rhythm.     Heart sounds: Normal heart sounds. No murmur heard.    No friction rub. No gallop.  Pulmonary:     Effort: Pulmonary effort is normal. No tachypnea or respiratory  distress.     Breath sounds: Normal breath sounds. No decreased breath sounds, wheezing, rhonchi or rales.  Chest:     Chest wall: No tenderness.  Breasts:    Right: Skin change present. No bleeding.     Left: Normal.       Comments: Several areas of increased dense breast tissue in the right outer quadrant of right breast between the 2 and 3 o'clock position Abdominal:     General: Bowel sounds are normal.     Palpations: Abdomen  is soft.  Musculoskeletal:        General: Normal range of motion.     Cervical back: Normal range of motion.  Skin:    General: Skin is warm and dry.  Neurological:     Mental Status: She is alert and oriented to person, place, and time.     Coordination: Coordination normal.  Psychiatric:        Behavior: Behavior normal. Behavior is cooperative.        Thought Content: Thought content normal.        Judgment: Judgment normal.          Patient has been counseled extensively about nutrition and exercise as well as the importance of adherence with medications and regular follow-up. The patient was given clear instructions to go to ER or return to medical center if symptoms don't improve, worsen or new problems develop. The patient verbalized understanding.   Follow-up: Return in about 3 months (around 12/15/2021) for PAP SMEAR.   Gildardo Pounds, FNP-BC [facility] [place], [place]   09/14/2021, 10:36 AM

## 2021-09-15 ENCOUNTER — Ambulatory Visit
Admission: RE | Admit: 2021-09-15 | Discharge: 2021-09-15 | Disposition: A | Discharge status: Home / Self Care | Payer: Medicaid Other | Source: Ambulatory Visit | Attending: Nurse Practitioner | Admitting: Nurse Practitioner

## 2021-09-15 DIAGNOSIS — L98499 Non-pressure chronic ulcer of skin of other sites with unspecified severity: Secondary | ICD-10-CM

## 2021-09-15 DIAGNOSIS — N63 Unspecified lump in unspecified breast: Secondary | ICD-10-CM

## 2021-09-15 DIAGNOSIS — N6489 Other specified disorders of breast: Secondary | ICD-10-CM | POA: Diagnosis not present

## 2021-09-15 DIAGNOSIS — R928 Other abnormal and inconclusive findings on diagnostic imaging of breast: Secondary | ICD-10-CM | POA: Diagnosis not present

## 2021-09-15 LAB — CMP14+EGFR
ALT: 23 IU/L (ref 0–32)
AST: 27 IU/L (ref 0–40)
Albumin/Globulin Ratio: 1.5 (ref 1.2–2.2)
Albumin: 4.1 g/dL (ref 3.8–4.8)
Alkaline Phosphatase: 91 IU/L (ref 44–121)
BUN/Creatinine Ratio: 11 (ref 9–23)
BUN: 6 mg/dL (ref 6–24)
Bilirubin Total: 0.3 mg/dL (ref 0.0–1.2)
CO2: 21 mmol/L (ref 20–29)
Calcium: 8.7 mg/dL (ref 8.7–10.2)
Chloride: 102 mmol/L (ref 96–106)
Creatinine, Ser: 0.55 mg/dL — ABNORMAL LOW (ref 0.57–1.00)
Globulin, Total: 2.8 g/dL (ref 1.5–4.5)
Glucose: 117 mg/dL — ABNORMAL HIGH (ref 70–99)
Potassium: 4.7 mmol/L (ref 3.5–5.2)
Sodium: 138 mmol/L (ref 134–144)
Total Protein: 6.9 g/dL (ref 6.0–8.5)
eGFR: 113 mL/min/{1.73_m2} (ref >59)

## 2021-09-15 LAB — CBC WITH DIFFERENTIAL/PLATELET
Basophils Absolute: 0.1 10*3/uL (ref 0.0–0.2)
Basos: 1 %
EOS (ABSOLUTE): 0.2 10*3/uL (ref 0.0–0.4)
Eos: 2 %
Hematocrit: 40.6 % (ref 34.0–46.6)
Hemoglobin: 13.2 g/dL (ref 11.1–15.9)
Immature Grans (Abs): 0 10*3/uL (ref 0.0–0.1)
Immature Granulocytes: 0 %
Lymphocytes Absolute: 2.7 10*3/uL (ref 0.7–3.1)
Lymphs: 29 %
MCH: 26.1 pg — ABNORMAL LOW (ref 26.6–33.0)
MCHC: 32.5 g/dL (ref 31.5–35.7)
MCV: 80 fL (ref 79–97)
Monocytes Absolute: 0.7 10*3/uL (ref 0.1–0.9)
Monocytes: 7 %
Neutrophils Absolute: 5.8 10*3/uL (ref 1.4–7.0)
Neutrophils: 61 %
Platelets: 397 10*3/uL (ref 150–450)
RBC: 5.05 x10E6/uL (ref 3.77–5.28)
RDW: 14.7 % (ref 11.7–15.4)
WBC: 9.5 10*3/uL (ref 3.4–10.8)

## 2021-09-15 LAB — LIPID PANEL
Chol/HDL Ratio: 2.6 ratio (ref 0.0–4.4)
Cholesterol, Total: 109 mg/dL (ref 100–199)
HDL: 42 mg/dL (ref >39)
LDL Chol Calc (NIH): 52 mg/dL (ref 0–99)
Triglycerides: 70 mg/dL (ref 0–149)
VLDL Cholesterol Cal: 15 mg/dL (ref 5–40)

## 2021-09-15 LAB — HEMOGLOBIN A1C
Est. average glucose Bld gHb Est-mCnc: 183 mg/dL
Hgb A1c MFr Bld: 8 % — ABNORMAL HIGH (ref 4.8–5.6)

## 2021-09-19 ENCOUNTER — Other Ambulatory Visit: Payer: Self-pay

## 2021-09-19 ENCOUNTER — Other Ambulatory Visit: Payer: Self-pay | Admitting: Nurse Practitioner

## 2021-09-19 MED ORDER — EMPAGLIFLOZIN 25 MG PO TABS
25.0000 mg | ORAL_TABLET | Freq: Every day | ORAL | 1 refills | Status: DC
Start: 2021-09-19 — End: 2021-12-21
  Filled 2021-09-19: qty 90, 90d supply, fill #0
  Filled 2021-11-04: qty 90, 90d supply, fill #1
  Filled 2021-12-01: qty 30, 30d supply, fill #1

## 2021-09-20 ENCOUNTER — Other Ambulatory Visit: Payer: Self-pay

## 2021-09-20 ENCOUNTER — Other Ambulatory Visit: Payer: Self-pay | Admitting: Nurse Practitioner

## 2021-09-20 DIAGNOSIS — E1165 Type 2 diabetes mellitus with hyperglycemia: Secondary | ICD-10-CM

## 2021-09-20 MED ORDER — BD PEN NEEDLE MINI U/F 31G X 5 MM MISC
3 refills | Status: DC
  Filled 2021-09-20: qty 100, 30d supply, fill #0
  Filled 2021-10-08: qty 100, 25d supply, fill #0
  Filled 2021-11-13: qty 100, 25d supply, fill #1
  Filled 2021-12-01: qty 100, 25d supply, fill #2

## 2021-09-21 ENCOUNTER — Other Ambulatory Visit: Payer: Self-pay

## 2021-09-24 ENCOUNTER — Other Ambulatory Visit: Payer: Self-pay | Admitting: Nurse Practitioner

## 2021-09-24 DIAGNOSIS — E1165 Type 2 diabetes mellitus with hyperglycemia: Secondary | ICD-10-CM

## 2021-09-26 ENCOUNTER — Other Ambulatory Visit: Payer: Self-pay

## 2021-09-26 MED ORDER — ACCU-CHEK GUIDE VI STRP
ORAL_STRIP | 2 refills | Status: DC
  Filled 2021-09-26: qty 100, 34d supply, fill #0
  Filled 2021-12-01: qty 50, 25d supply, fill #1
  Filled 2021-12-20: qty 50, 25d supply, fill #2

## 2021-10-06 ENCOUNTER — Other Ambulatory Visit (HOSPITAL_COMMUNITY): Payer: Self-pay

## 2021-10-08 ENCOUNTER — Other Ambulatory Visit: Payer: Self-pay | Admitting: Family Medicine

## 2021-10-08 DIAGNOSIS — E1165 Type 2 diabetes mellitus with hyperglycemia: Secondary | ICD-10-CM

## 2021-10-08 DIAGNOSIS — E785 Hyperlipidemia, unspecified: Secondary | ICD-10-CM

## 2021-10-10 ENCOUNTER — Other Ambulatory Visit: Payer: Self-pay

## 2021-10-10 MED ORDER — METFORMIN HCL 1000 MG PO TABS
1000.0000 mg | ORAL_TABLET | Freq: Two times a day (BID) | ORAL | 2 refills | Status: DC
  Filled 2021-10-10: qty 60, 30d supply, fill #0
  Filled 2021-11-04: qty 60, 30d supply, fill #1
  Filled 2021-12-01: qty 60, 30d supply, fill #2

## 2021-10-10 MED ORDER — ATORVASTATIN CALCIUM 20 MG PO TABS
20.0000 mg | ORAL_TABLET | Freq: Every day | ORAL | 2 refills | Status: DC
  Filled 2021-10-10: qty 30, 30d supply, fill #0
  Filled 2021-11-04: qty 30, 30d supply, fill #1
  Filled 2021-12-01: qty 30, 30d supply, fill #2

## 2021-10-11 ENCOUNTER — Other Ambulatory Visit: Payer: Self-pay

## 2021-10-12 ENCOUNTER — Ambulatory Visit: Payer: Medicaid Other | Admitting: Nurse Practitioner

## 2021-11-04 ENCOUNTER — Other Ambulatory Visit: Payer: Self-pay | Admitting: Nurse Practitioner

## 2021-11-04 ENCOUNTER — Other Ambulatory Visit: Payer: Self-pay

## 2021-11-04 DIAGNOSIS — L98499 Non-pressure chronic ulcer of skin of other sites with unspecified severity: Secondary | ICD-10-CM

## 2021-11-04 MED ORDER — MUPIROCIN 2 % EX OINT
1.0000 | TOPICAL_OINTMENT | Freq: Two times a day (BID) | CUTANEOUS | 0 refills | Status: DC
  Filled 2021-11-04: qty 22, 11d supply, fill #0
  Filled 2021-11-13: qty 22, 11d supply, fill #1
  Filled 2021-12-20: qty 22, 11d supply, fill #2

## 2021-11-04 MED ORDER — GLIPIZIDE ER 10 MG PO TB24
10.0000 mg | ORAL_TABLET | Freq: Every day | ORAL | 2 refills | Status: DC
  Filled 2021-11-04: qty 30, 30d supply, fill #0
  Filled 2021-12-20: qty 30, 30d supply, fill #1

## 2021-11-04 NOTE — Telephone Encounter (Signed)
Requested medication (s) are due for refill today:   Yes  Requested medication (s) are on the active medication list:   Yes  Future visit scheduled:   Yes   Last ordered: 09/14/2021 60 g, 0 refills  Returned because this medication does not have a protocol assigned to it.   Requested Prescriptions  Pending Prescriptions Disp Refills   mupirocin ointment (BACTROBAN) 2 % 60 g 0    Sig: Apply 1 application  topically 2 (two) times daily.     Off-Protocol Failed - 11/04/2021 11:17 AM      Failed - Medication not assigned to a protocol, review manually.      Passed - Valid encounter within last 12 months    Recent Outpatient Visits           1 month ago Skin ulcer of female breast [facility])   [facility] [place], [place] W, NP   5 months ago Type 2 diabetes mellitus with hyperglycemia, without long-term current use of insulin [facility])   [place] [facility] [place], Jeannett Senior L, RPH-CPP   7 months ago Type 2 diabetes mellitus with hyperglycemia, without long-term current use of insulin [facility])   [facility] [place], Jeannett Senior L, RPH-CPP   8 months ago Type 2 diabetes mellitus with hyperglycemia, without long-term current use of insulin [facility])   [place] [facility] [place], Cornelius Moras, RPH-CPP   9 months ago Type 2 diabetes mellitus with hyperglycemia, without long-term current use of insulin [facility])   [facility] Claiborne Rigg, NP       Future Appointments             In 1 month Claiborne Rigg, NP [facility]

## 2021-11-04 NOTE — Telephone Encounter (Signed)
Requested Prescriptions  Pending Prescriptions Disp Refills  . glipiZIDE (GLIPIZIDE XL) 10 MG 24 hr tablet 30 tablet 2    Sig: Take 1 tablet (10 mg total) by mouth daily with breakfast.     Endocrinology:  Diabetes - Sulfonylureas Failed - 11/04/2021 11:15 AM      Failed - HBA1C is between 0 and 7.9 and within 180 days    HbA1c, POC (controlled diabetic range)  Date Value Ref Range Status  01/26/2021 9.2 (A) 0.0 - 7.0 % Final   Hgb A1c MFr Bld  Date Value Ref Range Status  09/14/2021 8.0 (H) 4.8 - 5.6 % Final    Comment:             Prediabetes: 5.7 - 6.4          Diabetes: >6.4          Glycemic control for adults with diabetes: <7.0          Failed - Cr in normal range and within 360 days    Creat  Date Value Ref Range Status  07/09/2015 0.43 (L) 0.50 - 1.10 mg/dL Final   Creatinine, Ser  Date Value Ref Range Status  09/14/2021 0.55 (L) 0.57 - 1.00 mg/dL Final   Creatinine, POC  Date Value Ref Range Status  05/21/2017 300 mg/dL Final   Creatinine, Urine  Date Value Ref Range Status  03/06/2016 37 20 - 320 mg/dL Final         Passed - Valid encounter within last 6 months    Recent Outpatient Visits          1 month ago Skin ulcer of female breast [facility])   [facility] [place], [place] W, NP   5 months ago Type 2 diabetes mellitus with hyperglycemia, without long-term current use of insulin [facility])   [place] [facility] [place], Jeannett Senior L, RPH-CPP   7 months ago Type 2 diabetes mellitus with hyperglycemia, without long-term current use of insulin [facility])   [place] [facility] [place], Jeannett Senior L, RPH-CPP   8 months ago Type 2 diabetes mellitus with hyperglycemia, without long-term current use of insulin [facility])   [place] [facility] [place], Jeannett Senior L, RPH-CPP   9 months ago Type 2 diabetes mellitus with hyperglycemia, without long-term current use of insulin [facility])   [facility] [place], Shea Stakes, NP      Future Appointments            In 1 month Claiborne Rigg, NP [facility]

## 2021-11-07 ENCOUNTER — Other Ambulatory Visit: Payer: Self-pay

## 2021-11-14 ENCOUNTER — Other Ambulatory Visit: Payer: Self-pay

## 2021-11-15 ENCOUNTER — Other Ambulatory Visit: Payer: Self-pay

## 2021-12-01 ENCOUNTER — Other Ambulatory Visit: Payer: Self-pay

## 2021-12-01 ENCOUNTER — Other Ambulatory Visit: Payer: Self-pay | Admitting: Family Medicine

## 2021-12-01 DIAGNOSIS — E1165 Type 2 diabetes mellitus with hyperglycemia: Secondary | ICD-10-CM

## 2021-12-01 MED ORDER — LISINOPRIL 2.5 MG PO TABS
2.5000 mg | ORAL_TABLET | Freq: Every day | ORAL | 0 refills | Status: DC
  Filled 2021-12-01: qty 30, 30d supply, fill #0

## 2021-12-02 ENCOUNTER — Other Ambulatory Visit: Payer: Self-pay

## 2021-12-20 ENCOUNTER — Other Ambulatory Visit: Payer: Self-pay | Admitting: Family Medicine

## 2021-12-20 DIAGNOSIS — E1165 Type 2 diabetes mellitus with hyperglycemia: Secondary | ICD-10-CM

## 2021-12-21 ENCOUNTER — Ambulatory Visit: Payer: Medicaid Other | Attending: Nurse Practitioner | Admitting: Nurse Practitioner

## 2021-12-21 ENCOUNTER — Encounter: Payer: Self-pay | Admitting: Nurse Practitioner

## 2021-12-21 ENCOUNTER — Other Ambulatory Visit: Payer: Self-pay

## 2021-12-21 ENCOUNTER — Other Ambulatory Visit: Payer: Self-pay | Admitting: Family Medicine

## 2021-12-21 VITALS — BP 111/78 | HR 101 | Temp 97.6°F | Ht 63.0 in | Wt 279.0 lb

## 2021-12-21 DIAGNOSIS — E1165 Type 2 diabetes mellitus with hyperglycemia: Secondary | ICD-10-CM | POA: Diagnosis not present

## 2021-12-21 DIAGNOSIS — E785 Hyperlipidemia, unspecified: Secondary | ICD-10-CM | POA: Diagnosis not present

## 2021-12-21 DIAGNOSIS — Z6841 Body Mass Index (BMI) 40.0 and over, adult: Secondary | ICD-10-CM

## 2021-12-21 DIAGNOSIS — L98499 Non-pressure chronic ulcer of skin of other sites with unspecified severity: Secondary | ICD-10-CM

## 2021-12-21 DIAGNOSIS — Z1211 Encounter for screening for malignant neoplasm of colon: Secondary | ICD-10-CM | POA: Diagnosis not present

## 2021-12-21 LAB — GLUCOSE, POCT (MANUAL RESULT ENTRY): POC Glucose: 149 mg/dl — AB (ref 70–99)

## 2021-12-21 LAB — POCT GLYCOSYLATED HEMOGLOBIN (HGB A1C): Hemoglobin A1C: 7.5 % — AB (ref 4.0–5.6)

## 2021-12-21 MED ORDER — LANTUS SOLOSTAR 100 UNIT/ML ~~LOC~~ SOPN
20.0000 [IU] | PEN_INJECTOR | Freq: Every day | SUBCUTANEOUS | 99 refills | Status: DC
  Filled 2021-12-21 – 2022-01-10 (×8): qty 15, 75d supply, fill #0
  Filled 2022-03-23: qty 15, 75d supply, fill #1

## 2021-12-21 MED ORDER — METFORMIN HCL 1000 MG PO TABS
1000.0000 mg | ORAL_TABLET | Freq: Two times a day (BID) | ORAL | 1 refills | Status: DC
  Filled 2021-12-21 – 2022-01-03 (×2): qty 180, 90d supply, fill #0
  Filled 2022-03-23: qty 180, 90d supply, fill #1

## 2021-12-21 MED ORDER — GLIPIZIDE ER 10 MG PO TB24
10.0000 mg | ORAL_TABLET | Freq: Every day | ORAL | 1 refills | Status: DC
  Filled 2022-01-16: qty 82, 82d supply, fill #0
  Filled 2022-01-16: qty 8, 8d supply, fill #0
  Filled 2022-01-23: qty 90, 90d supply, fill #0
  Filled 2022-03-23 – 2022-04-20 (×2): qty 90, 90d supply, fill #1

## 2021-12-21 MED ORDER — EMPAGLIFLOZIN 25 MG PO TABS
25.0000 mg | ORAL_TABLET | Freq: Every day | ORAL | 1 refills | Status: DC
  Filled 2021-12-21 – 2022-01-23 (×3): qty 90, 90d supply, fill #0
  Filled 2022-03-23 – 2022-04-20 (×2): qty 90, 90d supply, fill #1

## 2021-12-21 MED ORDER — BD PEN NEEDLE MINI U/F 31G X 5 MM MISC
3 refills | Status: DC
  Filled 2021-12-21 – 2022-01-23 (×3): qty 100, 34d supply, fill #0

## 2021-12-21 MED ORDER — TRULICITY 4.5 MG/0.5ML ~~LOC~~ SOAJ
4.5000 mg | SUBCUTANEOUS | 3 refills | Status: DC
  Filled 2021-12-21 – 2022-01-03 (×2): qty 2, 28d supply, fill #0
  Filled 2022-02-27: qty 2, 28d supply, fill #1
  Filled 2022-03-23: qty 2, 28d supply, fill #2
  Filled 2022-04-20: qty 2, 28d supply, fill #3

## 2021-12-21 MED ORDER — LISINOPRIL 2.5 MG PO TABS
2.5000 mg | ORAL_TABLET | Freq: Every day | ORAL | 1 refills | Status: DC
  Filled 2021-12-21 – 2021-12-26 (×2): qty 90, 90d supply, fill #0
  Filled 2022-03-14 – 2022-03-23 (×2): qty 90, 90d supply, fill #1

## 2021-12-21 MED ORDER — ACCU-CHEK GUIDE VI STRP
ORAL_STRIP | 2 refills | Status: DC
  Filled 2022-01-16 – 2022-01-23 (×2): qty 50, 25d supply, fill #0
  Filled 2022-03-14 – 2022-04-20 (×2): qty 50, 25d supply, fill #1
  Filled 2022-06-01: qty 50, 25d supply, fill #2
  Filled 2022-06-21: qty 50, 25d supply, fill #3
  Filled 2022-06-26: qty 100, 50d supply, fill #3
  Filled 2022-06-26: qty 50, 25d supply, fill #3
  Filled 2022-07-23 – 2022-08-08 (×2): qty 50, 25d supply, fill #4
  Filled 2022-09-01: qty 50, 25d supply, fill #5
  Filled 2022-09-11: qty 100, 34d supply, fill #5
  Filled 2022-11-28: qty 100, 34d supply, fill #6

## 2021-12-21 MED ORDER — ATORVASTATIN CALCIUM 20 MG PO TABS
20.0000 mg | ORAL_TABLET | Freq: Every day | ORAL | 1 refills | Status: DC
  Filled 2021-12-21 – 2022-01-03 (×2): qty 90, 90d supply, fill #0
  Filled 2022-03-23: qty 90, 90d supply, fill #1

## 2021-12-21 NOTE — Progress Notes (Signed)
Assessment & Plan:  Tahiri was seen today for diabetes and weight loss.  Diagnoses and all orders for this visit:  Type 2 diabetes mellitus with hyperglycemia, without long-term current use of insulin (HCC) -     POCT glycosylated hemoglobin (Hb A1C) -     POCT glucose (manual entry) -     CMP14+EGFR -     Ambulatory referral to Ophthalmology -     empagliflozin (JARDIANCE) 25 MG TABS tablet; Take 1 tablet (25 mg total) by mouth daily before breakfast. -     Dulaglutide (TRULICITY) 4.5 UR/4.2HC SOPN; Inject 4.5 mg as directed once a week. -     glipiZIDE (GLIPIZIDE XL) 10 MG 24 hr tablet; Take 1 tablet (10 mg total) by mouth daily with breakfast. -     glucose blood (ACCU-CHEK GUIDE) test strip; USE AS INSTRUCTED. CHECK BLOOD GLUCOSE LEVEL BY FINGERSTICK TWICE PER DAY. -     insulin glargine (LANTUS SOLOSTAR) 100 UNIT/ML Solostar Pen; Inject 20 Units into the skin daily. -     Insulin Pen Needle (B-D UF III MINI PEN NEEDLES) 31G X 5 MM MISC; Use as instructed. Inject into the skin once weekly. -     lisinopril (ZESTRIL) 2.5 MG tablet; Take 1 tablet (2.5 mg total) by mouth daily. -     metFORMIN (GLUCOPHAGE) 1000 MG tablet; Take 1 tablet (1,000 mg total) by mouth 2 (two) times daily with a meal.  Morbid obesity with BMI of 45.0-49.9, adult (HCC) -     Amb Referral to Bariatric Surgery Discussed diet and exercise for person with BMI >49. Instructed: You must burn more calories than you eat. Losing 5 percent of your body weight should be considered a success. In the longer term, losing more than 15 percent of your body weight and staying at this weight is an extremely good result. However, keep in mind that even losing 5 percent of your body weight leads to important health benefits, so try not to get discouraged if you're not able to lose more than this.   Colon cancer screening -     Ambulatory referral to Gastroenterology  Dyslipidemia, goal LDL below 70 -     atorvastatin (LIPITOR) 20  MG tablet; Take 1 tablet (20 mg total) by mouth daily.    Patient has been counseled on age-appropriate routine health concerns for screening and prevention. These are reviewed and up-to-date. Referrals have been placed accordingly. Immunizations are up-to-date or declined.    Subjective:   Chief Complaint  Patient presents with   Diabetes   Weight Loss   Diabetes Pertinent negatives for hypoglycemia include no dizziness, headaches or seizures. Pertinent negatives for diabetes include no blurred vision, no chest pain and no weight loss.   Norma Chambers 51 y.o. female presents to office today for follow up to DM   She has a past medical history of Gestational diabetes, Obesity, and Type 2 diabetes mellitus with hyperglycemia, without long-term current use of insulin.     DM Improving. She has made dietary changes. Eating more protein and vegetables. Less carbs. Monitoring her blood glucose levels twice per day.  Fasting average 130-140s. Post prandial average 160-180s Lab Results  Component Value Date   HGBA1C 7.5 (A) 12/21/2021    Lab Results  Component Value Date   HGBA1C 8.0 (H) 09/14/2021     Morbid Obesity Interested in referral to bariatric to discuss the gastric sleeve. Having difficulty getting weight down despite dietary changes and trying  to increase her activity. Walking makes both of her knees hurt so she is limited with the amount of cardio she can do. BMI 49 She is already taking trulicity at the maximum dosage of 4.5     Review of Systems  Constitutional:  Negative for fever, malaise/fatigue and weight loss.  HENT: Negative.  Negative for nosebleeds.   Eyes: Negative.  Negative for blurred vision, double vision and photophobia.  Respiratory: Negative.  Negative for cough and shortness of breath.   Cardiovascular: Negative.  Negative for chest pain, palpitations and leg swelling.  Gastrointestinal: Negative.  Negative for heartburn, nausea and vomiting.   Musculoskeletal: Negative.  Negative for myalgias.  Neurological: Negative.  Negative for dizziness, focal weakness, seizures and headaches.  Psychiatric/Behavioral: Negative.  Negative for suicidal ideas.     Past Medical History:  Diagnosis Date   Gestational diabetes    Obesity    Type 2 diabetes mellitus with hyperglycemia, without long-term current use of insulin ([place])     Past Surgical History:  Procedure Laterality Date   CESAREAN SECTION     CESAREAN SECTION N/A 11/18/2012   Procedure: REPEAT CESAREAN SECTION;  Surgeon: Mora Bellman, MD;  Location: [facility];  Service: Obstetrics;  Laterality: N/A;    Family History  Problem Relation Age of Onset   Diabetes Mother    Diabetes Maternal Grandmother    Bell's palsy Neg Hx     Social History Reviewed with no changes to be made today.   Outpatient Medications Prior to Visit  Medication Sig Dispense Refill   Accu-Chek Softclix Lancets lancets Use to inject into the skin twice daily 200 each 3   Blood Glucose Monitoring Suppl (ACCU-CHEK GUIDE) w/Device KIT Use as instructed. Check blood glucose level by fingerstick twice per day. 1 kit 0   atorvastatin (LIPITOR) 20 MG tablet Take 1 tablet (20 mg total) by mouth daily. 30 tablet 2   Dulaglutide (TRULICITY) 4.5 OA/4.1YS SOPN Inject 4.5 mg as directed once a week. 2 mL 3   empagliflozin (JARDIANCE) 25 MG TABS tablet Take 1 tablet (25 mg total) by mouth daily before breakfast. 90 tablet 1   glipiZIDE (GLIPIZIDE XL) 10 MG 24 hr tablet Take 1 tablet (10 mg total) by mouth daily with breakfast. 30 tablet 2   glucose blood (ACCU-CHEK GUIDE) test strip USE AS INSTRUCTED. CHECK BLOOD GLUCOSE LEVEL BY FINGERSTICK TWICE PER DAY. 200 strip 2   insulin glargine (LANTUS SOLOSTAR) 100 UNIT/ML Solostar Pen Inject 20 Units into the skin daily. 15 mL PRN   Insulin Pen Needle (B-D UF III MINI PEN NEEDLES) 31G X 5 MM MISC Use as instructed. Inject into the skin once weekly. 100 each 3   lisinopril  (ZESTRIL) 2.5 MG tablet Take 1 tablet (2.5 mg total) by mouth daily. 30 tablet 0   metFORMIN (GLUCOPHAGE) 1000 MG tablet Take 1 tablet (1,000 mg total) by mouth 2 (two) times daily with a meal. 60 tablet 2   doxycycline (VIBRAMYCIN) 100 MG capsule Take 1 capsule (100 mg total) by mouth 2 (two) times daily. (Patient not taking: Reported on 09/14/2021) 20 capsule 0   mupirocin ointment (BACTROBAN) 2 % Apply 1 application  topically 2 (two) times daily. (Patient not taking: Reported on 12/21/2021) 60 g 0   nystatin (MYCOSTATIN/NYSTOP) powder Apply 1 application. topically 3 (three) times daily as needed. (Patient not taking: Reported on 12/21/2021) 90 g 0   valACYclovir (VALTREX) 1000 MG tablet Take 1 tablet (1,000 mg total) by mouth  3 (three) times daily. (Patient not taking: Reported on 12/21/2021) 21 tablet 0   No facility-administered medications prior to visit.    No Known Allergies     Objective:    BP 111/78   Pulse (!) 101   Temp 97.6 F (36.4 C) (Oral)   Ht _0  (1.6 m)   Wt 279 lb (126.6 kg)   SpO2 97%   BMI 49.42 kg/m  Wt Readings from Last 3 Encounters:  12/21/21 279 lb (126.6 kg)  09/14/21 282 lb 9.6 oz (128.2 kg)  01/26/21 286 lb 12.8 oz (130.1 kg)    Physical Exam Vitals and nursing note reviewed.  Constitutional:      Appearance: She is well-developed.  HENT:     Head: Normocephalic and atraumatic.  Cardiovascular:     Rate and Rhythm: Regular rhythm. Tachycardia present.     Heart sounds: Normal heart sounds. No murmur heard.    No friction rub. No gallop.  Pulmonary:     Effort: Pulmonary effort is normal. No tachypnea or respiratory distress.     Breath sounds: Normal breath sounds. No decreased breath sounds, wheezing, rhonchi or rales.  Chest:     Chest wall: No tenderness.  Abdominal:     General: Bowel sounds are normal.     Palpations: Abdomen is soft.  Musculoskeletal:        General: Normal range of motion.     Cervical back: Normal range of  motion.  Skin:    General: Skin is warm and dry.  Neurological:     Mental Status: She is alert and oriented to person, place, and time.     Coordination: Coordination normal.  Psychiatric:        Behavior: Behavior normal. Behavior is cooperative.        Thought Content: Thought content normal.        Judgment: Judgment normal.          Patient has been counseled extensively about nutrition and exercise as well as the importance of adherence with medications and regular follow-up. The patient was given clear instructions to go to ER or return to medical center if symptoms don't improve, worsen or new problems develop. The patient verbalized understanding.   Follow-up: Return for PAP in november.   Gildardo Pounds, FNP-BC [facility] and [facility] [place], [place]   12/21/2021, 9:27 AM

## 2021-12-22 LAB — CMP14+EGFR
ALT: 33 IU/L — ABNORMAL HIGH (ref 0–32)
AST: 33 IU/L (ref 0–40)
Albumin/Globulin Ratio: 1.5 (ref 1.2–2.2)
Albumin: 4.6 g/dL (ref 3.9–4.9)
Alkaline Phosphatase: 102 IU/L (ref 44–121)
BUN/Creatinine Ratio: 13 (ref 9–23)
BUN: 9 mg/dL (ref 6–24)
Bilirubin Total: 0.3 mg/dL (ref 0.0–1.2)
CO2: 23 mmol/L (ref 20–29)
Calcium: 9.7 mg/dL (ref 8.7–10.2)
Chloride: 94 mmol/L — ABNORMAL LOW (ref 96–106)
Creatinine, Ser: 0.67 mg/dL (ref 0.57–1.00)
Globulin, Total: 3.1 g/dL (ref 1.5–4.5)
Glucose: 136 mg/dL — ABNORMAL HIGH (ref 70–99)
Potassium: 4.8 mmol/L (ref 3.5–5.2)
Sodium: 137 mmol/L (ref 134–144)
Total Protein: 7.7 g/dL (ref 6.0–8.5)
eGFR: 108 mL/min/{1.73_m2} (ref >59)

## 2021-12-26 ENCOUNTER — Other Ambulatory Visit: Payer: Self-pay

## 2021-12-28 ENCOUNTER — Other Ambulatory Visit: Payer: Self-pay

## 2021-12-30 ENCOUNTER — Other Ambulatory Visit: Payer: Self-pay

## 2022-01-03 ENCOUNTER — Other Ambulatory Visit: Payer: Self-pay

## 2022-01-05 ENCOUNTER — Other Ambulatory Visit: Payer: Self-pay

## 2022-01-09 ENCOUNTER — Other Ambulatory Visit: Payer: Self-pay

## 2022-01-10 ENCOUNTER — Other Ambulatory Visit: Payer: Self-pay

## 2022-01-16 ENCOUNTER — Other Ambulatory Visit: Payer: Self-pay

## 2022-01-23 ENCOUNTER — Other Ambulatory Visit: Payer: Self-pay

## 2022-01-31 ENCOUNTER — Encounter (INDEPENDENT_AMBULATORY_CARE_PROVIDER_SITE_OTHER): Payer: Self-pay

## 2022-02-21 ENCOUNTER — Ambulatory Visit: Payer: Medicaid Other | Admitting: Nurse Practitioner

## 2022-02-28 ENCOUNTER — Other Ambulatory Visit: Payer: Self-pay

## 2022-03-15 ENCOUNTER — Other Ambulatory Visit: Payer: Self-pay

## 2022-03-21 ENCOUNTER — Other Ambulatory Visit: Payer: Self-pay

## 2022-03-24 ENCOUNTER — Other Ambulatory Visit: Payer: Self-pay

## 2022-04-24 ENCOUNTER — Other Ambulatory Visit: Payer: Self-pay

## 2022-05-10 ENCOUNTER — Other Ambulatory Visit: Payer: Self-pay

## 2022-05-10 ENCOUNTER — Ambulatory Visit: Payer: Medicaid Other | Attending: Physician Assistant | Admitting: Physician Assistant

## 2022-05-10 ENCOUNTER — Encounter: Payer: Self-pay | Admitting: Physician Assistant

## 2022-05-10 VITALS — BP 123/86 | HR 90 | Ht 64.0 in | Wt 286.8 lb

## 2022-05-10 DIAGNOSIS — E1165 Type 2 diabetes mellitus with hyperglycemia: Secondary | ICD-10-CM | POA: Diagnosis not present

## 2022-05-10 DIAGNOSIS — E785 Hyperlipidemia, unspecified: Secondary | ICD-10-CM

## 2022-05-10 DIAGNOSIS — Z6841 Body Mass Index (BMI) 40.0 and over, adult: Secondary | ICD-10-CM | POA: Diagnosis not present

## 2022-05-10 LAB — POCT GLYCOSYLATED HEMOGLOBIN (HGB A1C): HbA1c, POC (controlled diabetic range): 7.5 % — AB (ref 0.0–7.0)

## 2022-05-10 LAB — GLUCOSE, POCT (MANUAL RESULT ENTRY): POC Glucose: 128 mg/dl — AB (ref 70–99)

## 2022-05-10 MED ORDER — LISINOPRIL 2.5 MG PO TABS
2.5000 mg | ORAL_TABLET | Freq: Every day | ORAL | 1 refills | Status: DC
  Filled 2022-05-10 – 2022-06-01 (×2): qty 90, 90d supply, fill #0

## 2022-05-10 MED ORDER — BD PEN NEEDLE MINI U/F 31G X 5 MM MISC
3 refills | Status: AC
  Filled 2022-05-10 – 2022-06-01 (×2): qty 100, 34d supply, fill #0
  Filled 2022-08-08: qty 100, 34d supply, fill #1
  Filled 2022-09-11: qty 100, 34d supply, fill #2

## 2022-05-10 MED ORDER — LANTUS SOLOSTAR 100 UNIT/ML ~~LOC~~ SOPN
20.0000 [IU] | PEN_INJECTOR | Freq: Every day | SUBCUTANEOUS | 99 refills | Status: DC
  Filled 2022-05-10 – 2022-06-01 (×2): qty 15, 75d supply, fill #0

## 2022-05-10 MED ORDER — METFORMIN HCL 1000 MG PO TABS
1000.0000 mg | ORAL_TABLET | Freq: Two times a day (BID) | ORAL | 1 refills | Status: DC
  Filled 2022-05-10 – 2022-06-01 (×2): qty 180, 90d supply, fill #0

## 2022-05-10 MED ORDER — TRULICITY 4.5 MG/0.5ML ~~LOC~~ SOAJ
4.5000 mg | SUBCUTANEOUS | 3 refills | Status: DC
  Filled 2022-05-10 – 2022-06-01 (×2): qty 2, 28d supply, fill #0
  Filled 2022-06-26: qty 2, 28d supply, fill #1
  Filled 2022-07-23: qty 2, 28d supply, fill #2

## 2022-05-10 MED ORDER — ATORVASTATIN CALCIUM 20 MG PO TABS
20.0000 mg | ORAL_TABLET | Freq: Every day | ORAL | 1 refills | Status: DC
  Filled 2022-05-10 – 2022-06-01 (×2): qty 90, 90d supply, fill #0

## 2022-05-10 MED ORDER — EMPAGLIFLOZIN 25 MG PO TABS
25.0000 mg | ORAL_TABLET | Freq: Every day | ORAL | 1 refills | Status: DC
  Filled 2022-05-10 – 2022-07-23 (×3): qty 90, 90d supply, fill #0

## 2022-05-10 MED ORDER — GLIPIZIDE ER 10 MG PO TB24
10.0000 mg | ORAL_TABLET | Freq: Two times a day (BID) | ORAL | 1 refills | Status: DC
  Filled 2022-05-10 – 2022-06-27 (×4): qty 180, 90d supply, fill #0

## 2022-05-10 NOTE — Progress Notes (Signed)
Patient ID: Norma Chambers, female   DOB: 1970/05/18, 52 y.o.   MRN: [id number]   Norma Chambers, is a 52 y.o. female  [id number]  [id number]  DOB - 09/15/1970  Chief Complaint  Patient presents with   Medication Refill   Diabetes       Subjective:   Norma Chambers is a 52 y.o. female here today for diabetes check and med RF.  She reports compliance with medications but not with diet or exercise.  No new issues or concerns.  Does not take blood sugars regularly  No problems updated.  ALLERGIES: No Known Allergies  PAST MEDICAL HISTORY: Past Medical History:  Diagnosis Date   Gestational diabetes    Obesity    Type 2 diabetes mellitus with hyperglycemia, without long-term current use of insulin ([place])     MEDICATIONS AT HOME: Prior to Admission medications   Medication Sig Start Date End Date Taking? Authorizing Provider  Accu-Chek Softclix Lancets lancets Use to inject into the skin twice daily 09/08/20  Yes Gildardo Pounds, NP  Blood Glucose Monitoring Suppl (ACCU-CHEK GUIDE) w/Device KIT Use as instructed. Check blood glucose level by fingerstick twice per day. 05/25/21  Yes Gildardo Pounds, NP  glucose blood (ACCU-CHEK GUIDE) test strip USE AS INSTRUCTED. CHECK BLOOD GLUCOSE LEVEL BY FINGERSTICK TWICE PER DAY. 12/21/21  Yes Gildardo Pounds, NP  atorvastatin (LIPITOR) 20 MG tablet Take 1 tablet (20 mg total) by mouth daily. 05/10/22   Argentina Donovan, PA-C  Dulaglutide (TRULICITY) 4.5 BO/1.7PZ SOPN Inject 4.5 mg as directed once a week. 05/10/22   Argentina Donovan, PA-C  empagliflozin (JARDIANCE) 25 MG TABS tablet Take 1 tablet (25 mg total) by mouth daily before breakfast. 05/10/22   Argentina Donovan, PA-C  glipiZIDE (GLIPIZIDE XL) 10 MG 24 hr tablet Take 1 tablet (10 mg total) by mouth 2 (two) times daily before a meal. 05/10/22   Chanese Hartsough, Dionne Bucy, PA-C  insulin glargine (LANTUS SOLOSTAR) 100 UNIT/ML Solostar Pen Inject 20 Units into the skin daily. 05/10/22   Argentina Donovan, PA-C  Insulin Pen Needle (B-D UF III MINI PEN NEEDLES) 31G X 5 MM MISC Use as instructed. Inject into the skin once weekly. 05/10/22   Argentina Donovan, PA-C  lisinopril (ZESTRIL) 2.5 MG tablet Take 1 tablet (2.5 mg total) by mouth daily. 05/10/22   Argentina Donovan, PA-C  metFORMIN (GLUCOPHAGE) 1000 MG tablet Take 1 tablet (1,000 mg total) by mouth 2 (two) times daily with a meal. 05/10/22   Vaudine Dutan, Dionne Bucy, PA-C    ROS: Neg HEENT Neg resp Neg cardiac Neg GI Neg GU Neg MS Neg psych Neg neuro  Objective:   Vitals:   05/10/22 0928  BP: 123/86  Pulse: 90  SpO2: 96%  Weight: 286 lb 12.8 oz (130.1 kg)  Height: 5\' 4"  (1.626 m)   Exam General appearance : Awake, alert, not in any distress. Speech Clear. Not toxic looking HEENT: Atraumatic and Normocephalic Neck: Supple, no JVD. No cervical lymphadenopathy.  Chest: Good air entry bilaterally, CTAB.  No rales/rhonchi/wheezing CVS: S1 S2 regular, no murmurs.  Extremities: B/L Lower Ext shows no edema, both legs are warm to touch Neurology: Awake alert, and oriented X 3, CN II-XII intact, Non focal Skin: No Rash  Data Review Lab Results  Component Value Date   HGBA1C 7.5 (A) 05/10/2022   HGBA1C 7.5 (A) 12/21/2021   HGBA1C 8.0 (H) 09/14/2021    Assessment & Plan   1. Type  2 diabetes mellitus with hyperglycemia, without long-term current use of insulin (HCC) Not at goal Increase activity/exercise. Drink 80-100 ounces water daily.   Eliminate sugar and starchy foods and sugary drinks from your diet - Glucose (CBG) - POCT glycosylated hemoglobin (Hb A1C) - empagliflozin (JARDIANCE) 25 MG TABS tablet; Take 1 tablet (25 mg total) by mouth daily before breakfast.  Dispense: 90 tablet; Refill: 1 - glipiZIDE (GLIPIZIDE XL) 10 MG 24 hr tablet; Take 1 tablet (10 mg total) by mouth 2 (two) times daily before a meal.  Dispense: 180 tablet; Refill: 1 - lisinopril (ZESTRIL) 2.5 MG tablet; Take 1 tablet (2.5 mg total) by mouth daily.   Dispense: 90 tablet; Refill: 1 - metFORMIN (GLUCOPHAGE) 1000 MG tablet; Take 1 tablet (1,000 mg total) by mouth 2 (two) times daily with a meal.  Dispense: 180 tablet; Refill: 1 - Dulaglutide (TRULICITY) 4.5 TW/6.5KC SOPN; Inject 4.5 mg as directed once a week.  Dispense: 2 mL; Refill: 3 - insulin glargine (LANTUS SOLOSTAR) 100 UNIT/ML Solostar Pen; Inject 20 Units into the skin daily.  Dispense: 15 mL; Refill: PRN - Insulin Pen Needle (B-D UF III MINI PEN NEEDLES) 31G X 5 MM MISC; Use as instructed. Inject into the skin once weekly.  Dispense: 100 each; Refill: 3 - Comprehensive metabolic panel  2. Dyslipidemia, goal LDL below 70 - atorvastatin (LIPITOR) 20 MG tablet; Take 1 tablet (20 mg total) by mouth daily.  Dispense: 90 tablet; Refill: 1  3. Morbid obesity with BMI of 45.0-49.9, adult (HCC) Increase activity/exercise.  Drink 80-100 ounces water daily.   Eliminate sugar and starchy foods and sugary drinks from your diet    Return in about 3 months (around 08/08/2022) for PCP for chronic conditions.  The patient was given clear instructions to go to ER or return to medical center if symptoms don't improve, worsen or new problems develop. The patient verbalized understanding. The patient was told to call to get lab results if they haven't heard anything in the next week.      Freeman Caldron, PA-C [facility] and [facility] [place], [place]   05/10/2022, 9:48 AM

## 2022-05-10 NOTE — Patient Instructions (Signed)
Increase activity/exercise.  Drink 80-100 ounces water daily.   Eliminate sugar and starchy foods and sugary drinks from your diet

## 2022-05-11 LAB — COMPREHENSIVE METABOLIC PANEL
ALT: 36 IU/L — ABNORMAL HIGH (ref 0–32)
AST: 37 IU/L (ref 0–40)
Albumin/Globulin Ratio: 1.6 (ref 1.2–2.2)
Albumin: 4.4 g/dL (ref 3.9–4.9)
Alkaline Phosphatase: 89 IU/L (ref 44–121)
BUN/Creatinine Ratio: 15 (ref 9–23)
BUN: 8 mg/dL (ref 6–24)
Bilirubin Total: 0.2 mg/dL (ref 0.0–1.2)
CO2: 23 mmol/L (ref 20–29)
Calcium: 9.2 mg/dL (ref 8.7–10.2)
Chloride: 100 mmol/L (ref 96–106)
Creatinine, Ser: 0.53 mg/dL — ABNORMAL LOW (ref 0.57–1.00)
Globulin, Total: 2.7 g/dL (ref 1.5–4.5)
Glucose: 119 mg/dL — ABNORMAL HIGH (ref 70–99)
Potassium: 4.9 mmol/L (ref 3.5–5.2)
Sodium: 138 mmol/L (ref 134–144)
Total Protein: 7.1 g/dL (ref 6.0–8.5)
eGFR: 113 mL/min/{1.73_m2} (ref >59)

## 2022-05-16 ENCOUNTER — Other Ambulatory Visit: Payer: Self-pay

## 2022-06-01 ENCOUNTER — Other Ambulatory Visit: Payer: Self-pay

## 2022-06-02 ENCOUNTER — Other Ambulatory Visit: Payer: Self-pay

## 2022-06-21 ENCOUNTER — Other Ambulatory Visit (HOSPITAL_COMMUNITY): Payer: Self-pay

## 2022-06-21 ENCOUNTER — Other Ambulatory Visit: Payer: Self-pay

## 2022-06-26 ENCOUNTER — Other Ambulatory Visit: Payer: Self-pay

## 2022-06-28 ENCOUNTER — Other Ambulatory Visit: Payer: Self-pay

## 2022-06-30 ENCOUNTER — Other Ambulatory Visit (HOSPITAL_COMMUNITY): Payer: Self-pay

## 2022-07-24 ENCOUNTER — Other Ambulatory Visit: Payer: Self-pay

## 2022-07-31 ENCOUNTER — Other Ambulatory Visit: Payer: Self-pay

## 2022-08-08 ENCOUNTER — Other Ambulatory Visit: Payer: Self-pay

## 2022-08-08 ENCOUNTER — Ambulatory Visit: Payer: Medicaid Other | Attending: Nurse Practitioner | Admitting: Nurse Practitioner

## 2022-08-08 ENCOUNTER — Encounter: Payer: Self-pay | Admitting: Nurse Practitioner

## 2022-08-08 VITALS — BP 124/82 | HR 83 | Ht 64.0 in | Wt 290.2 lb

## 2022-08-08 DIAGNOSIS — Z794 Long term (current) use of insulin: Secondary | ICD-10-CM

## 2022-08-08 DIAGNOSIS — E785 Hyperlipidemia, unspecified: Secondary | ICD-10-CM

## 2022-08-08 DIAGNOSIS — E1169 Type 2 diabetes mellitus with other specified complication: Secondary | ICD-10-CM | POA: Insufficient documentation

## 2022-08-08 DIAGNOSIS — E1165 Type 2 diabetes mellitus with hyperglycemia: Secondary | ICD-10-CM | POA: Diagnosis not present

## 2022-08-08 DIAGNOSIS — Z7985 Long-term (current) use of injectable non-insulin antidiabetic drugs: Secondary | ICD-10-CM | POA: Diagnosis not present

## 2022-08-08 DIAGNOSIS — Z7984 Long term (current) use of oral hypoglycemic drugs: Secondary | ICD-10-CM | POA: Diagnosis not present

## 2022-08-08 LAB — POCT GLYCOSYLATED HEMOGLOBIN (HGB A1C): Hemoglobin A1C: 7.9 % — AB (ref 4.0–5.6)

## 2022-08-08 MED ORDER — LANTUS SOLOSTAR 100 UNIT/ML ~~LOC~~ SOPN
20.0000 [IU] | PEN_INJECTOR | Freq: Every day | SUBCUTANEOUS | 99 refills | Status: DC
  Filled 2022-08-08 – 2022-08-10 (×3): qty 15, 75d supply, fill #0
  Filled 2022-09-11: qty 6, 30d supply, fill #1
  Filled 2022-11-28: qty 6, 30d supply, fill #2
  Filled 2022-12-25: qty 6, 30d supply, fill #3
  Filled 2023-01-24: qty 6, 30d supply, fill #4
  Filled 2023-03-06: qty 6, 30d supply, fill #5
  Filled 2023-04-18: qty 6, 30d supply, fill #6
  Filled 2023-05-18 – 2023-05-30 (×2): qty 6, 30d supply, fill #7
  Filled 2023-07-09: qty 6, 30d supply, fill #8

## 2022-08-08 MED ORDER — EMPAGLIFLOZIN 25 MG PO TABS
25.0000 mg | ORAL_TABLET | Freq: Every day | ORAL | 1 refills | Status: DC
  Filled 2022-08-08: qty 90, 90d supply, fill #0
  Filled 2022-09-11: qty 30, 30d supply, fill #1
  Filled 2022-12-05 – 2022-12-06 (×2): qty 30, 30d supply, fill #2
  Filled 2023-01-02: qty 30, 30d supply, fill #3

## 2022-08-08 MED ORDER — SEMAGLUTIDE (1 MG/DOSE) 4 MG/3ML ~~LOC~~ SOPN
1.0000 mg | PEN_INJECTOR | SUBCUTANEOUS | 0 refills | Status: DC
Start: 2022-08-08 — End: 2022-12-05
  Filled 2022-08-08 – 2022-09-07 (×5): qty 6, 56d supply, fill #0

## 2022-08-08 MED ORDER — GLIPIZIDE ER 10 MG PO TB24
10.0000 mg | ORAL_TABLET | Freq: Two times a day (BID) | ORAL | 1 refills | Status: DC
  Filled 2022-08-08 – 2022-09-11 (×3): qty 180, 90d supply, fill #0
  Filled 2022-12-25: qty 180, 90d supply, fill #1

## 2022-08-08 MED ORDER — LISINOPRIL 2.5 MG PO TABS
2.5000 mg | ORAL_TABLET | Freq: Every day | ORAL | 1 refills | Status: DC
  Filled 2022-08-08 – 2022-08-10 (×3): qty 90, 90d supply, fill #0
  Filled 2022-09-11: qty 30, 30d supply, fill #1
  Filled 2022-12-25: qty 30, 30d supply, fill #2
  Filled 2023-01-24: qty 30, 30d supply, fill #3

## 2022-08-08 MED ORDER — METFORMIN HCL 1000 MG PO TABS
1000.0000 mg | ORAL_TABLET | Freq: Two times a day (BID) | ORAL | 1 refills | Status: DC
Start: 2022-08-08 — End: 2023-03-16
  Filled 2022-08-08 – 2022-09-11 (×3): qty 180, 90d supply, fill #0
  Filled 2022-12-14: qty 180, 90d supply, fill #1

## 2022-08-08 MED ORDER — OZEMPIC (0.25 OR 0.5 MG/DOSE) 2 MG/3ML ~~LOC~~ SOPN
0.5000 mg | PEN_INJECTOR | SUBCUTANEOUS | 1 refills | Status: DC
  Filled 2022-08-08: qty 3, 28d supply, fill #0
  Filled 2022-09-01 – 2022-11-28 (×3): qty 3, 28d supply, fill #1

## 2022-08-08 MED ORDER — ATORVASTATIN CALCIUM 20 MG PO TABS
20.0000 mg | ORAL_TABLET | Freq: Every day | ORAL | 1 refills | Status: DC
Start: 2022-08-08 — End: 2023-03-16
  Filled 2022-08-08 – 2022-09-11 (×4): qty 90, 90d supply, fill #0
  Filled 2022-12-14: qty 90, 90d supply, fill #1

## 2022-08-08 NOTE — Progress Notes (Signed)
Assessment & Plan:  Jasleen was seen today for diabetes.  Diagnoses and all orders for this visit:  Type 2 diabetes mellitus with hyperglycemia, without long-term current use of insulin  Stop Trulicity and start Ozempic 0.5 mg weekly -     POCT glycosylated hemoglobin (Hb A1C) -     Microalbumin / creatinine urine ratio -     CMP14+EGFR -     Semaglutide,0.25 or 0.5MG /DOS, (OZEMPIC, 0.25 OR 0.5 MG/DOSE,) 2 MG/3ML SOPN; Inject 0.5 mg into the skin once a week. -     empagliflozin (JARDIANCE) 25 MG TABS tablet; Take 1 tablet (25 mg total) by mouth daily before breakfast. -     glipiZIDE (GLIPIZIDE XL) 10 MG 24 hr tablet; Take 1 tablet (10 mg total) by mouth 2 (two) times daily before a meal. -     insulin glargine (LANTUS SOLOSTAR) 100 UNIT/ML Solostar Pen; Inject 20 Units into the skin daily. -     lisinopril (ZESTRIL) 2.5 MG tablet; Take 1 tablet (2.5 mg total) by mouth daily. -     metFORMIN (GLUCOPHAGE) 1000 MG tablet; Take 1 tablet (1,000 mg total) by mouth 2 (two) times daily with a meal. -     Semaglutide, 1 MG/DOSE, 4 MG/3ML SOPN; Inject 1 mg as directed once a week.  Hyperlipidemia associated with type 2 diabetes mellitus (HCC) -     atorvastatin (LIPITOR) 20 MG tablet; Take 1 tablet (20 mg total) by mouth daily.    Patient has been counseled on age-appropriate routine health concerns for screening and prevention. These are reviewed and up-to-date. Referrals have been placed accordingly. Immunizations are up-to-date or declined.    Subjective:   Chief Complaint  Patient presents with   Diabetes   HPI Norma Chambers 52 y.o. female presents to office today for follow-up to diabetes mellitus type 2.  She has a past medical history of Gestational diabetes, Obesity, and Type 2 diabetes mellitus with hyperglycemia, without long-term current use of insulin.        DM Improving but not at goal of less than 7.  Weight is also trending up.  At this time we will switch her Trulicity  to Ozempic.  She will continue on glimepiride, metformin, and Jardiance She is taking a renal dose ACE and low intensity statin with LDL at goal Lab Results  Component Value Date   HGBA1C 7.9 (A) 08/08/2022    Lab Results  Component Value Date   LDLCALC 52 09/14/2021     Review of Systems  Constitutional:  Negative for fever, malaise/fatigue and weight loss.  HENT: Negative.  Negative for nosebleeds.   Eyes: Negative.  Negative for blurred vision, double vision and photophobia.  Respiratory: Negative.  Negative for cough and shortness of breath.   Cardiovascular: Negative.  Negative for chest pain, palpitations and leg swelling.  Gastrointestinal: Negative.  Negative for heartburn, nausea and vomiting.  Musculoskeletal: Negative.  Negative for myalgias.  Neurological: Negative.  Negative for dizziness, focal weakness, seizures and headaches.  Psychiatric/Behavioral: Negative.  Negative for suicidal ideas.     Past Medical History:  Diagnosis Date   Gestational diabetes    Obesity    Type 2 diabetes mellitus with hyperglycemia, without long-term current use of insulin (HCC)     Past Surgical History:  Procedure Laterality Date   CESAREAN SECTION     CESAREAN SECTION N/A 11/18/2012   Procedure: REPEAT CESAREAN SECTION;  Surgeon: Chambers [place], MD;  Location: [facility];  Service: Obstetrics;  Laterality: N/A;    Family History  Problem Relation Age of Onset   Diabetes Mother    Diabetes Maternal Grandmother    Bell's palsy Neg Hx     Social History Reviewed with no changes to be made today.   Outpatient Medications Prior to Visit  Medication Sig Dispense Refill   Accu-Chek Softclix Lancets lancets Use to inject into the skin twice daily 200 each 3   Blood Glucose Monitoring Suppl (ACCU-CHEK GUIDE) w/Device KIT Use as instructed. Check blood glucose level by fingerstick twice per day. 1 kit 0   glucose blood (ACCU-CHEK GUIDE) test strip USE AS INSTRUCTED. CHECK BLOOD  GLUCOSE LEVEL BY FINGERSTICK TWICE PER DAY. 200 strip 2   Insulin Pen Needle (B-D UF III MINI PEN NEEDLES) 31G X 5 MM MISC Use as instructed. Inject into the skin with Lantus. 100 each 3   atorvastatin (LIPITOR) 20 MG tablet Take 1 tablet (20 mg total) by mouth daily. 90 tablet 1   Dulaglutide (TRULICITY) 4.5 MG/0.5ML SOPN Inject 4.5 mg as directed once a week. 2 mL 3   empagliflozin (JARDIANCE) 25 MG TABS tablet Take 1 tablet (25 mg total) by mouth daily before breakfast. 90 tablet 1   glipiZIDE (GLIPIZIDE XL) 10 MG 24 hr tablet Take 1 tablet (10 mg total) by mouth 2 (two) times daily before a meal. 180 tablet 1   insulin glargine (LANTUS SOLOSTAR) 100 UNIT/ML Solostar Pen Inject 20 Units into the skin daily. 15 mL PRN   lisinopril (ZESTRIL) 2.5 MG tablet Take 1 tablet (2.5 mg total) by mouth daily. 90 tablet 1   metFORMIN (GLUCOPHAGE) 1000 MG tablet Take 1 tablet (1,000 mg total) by mouth 2 (two) times daily with a meal. 180 tablet 1   No facility-administered medications prior to visit.    No Known Allergies     Objective:    BP 124/82 (BP Location: Left Arm, Patient Position: Sitting, Cuff Size: Large)   Pulse 83   Ht 5\' 4"  (1.626 m)   Wt 290 lb 3.2 oz (131.6 kg)   LMP 07/16/2022 (Approximate)   SpO2 100%   BMI 49.81 kg/m  Wt Readings from Last 3 Encounters:  08/08/22 290 lb 3.2 oz (131.6 kg)  05/10/22 286 lb 12.8 oz (130.1 kg)  12/21/21 279 lb (126.6 kg)    Physical Exam Vitals and nursing note reviewed.  Constitutional:      Appearance: She is well-developed. She is obese.  HENT:     Head: Normocephalic and atraumatic.  Cardiovascular:     Rate and Rhythm: Normal rate and regular rhythm.     Heart sounds: Normal heart sounds. No murmur heard.    No friction rub. No gallop.  Pulmonary:     Effort: Pulmonary effort is normal. No tachypnea or respiratory distress.     Breath sounds: Normal breath sounds. No decreased breath sounds, wheezing, rhonchi or rales.  Chest:      Chest wall: No tenderness.  Abdominal:     General: Bowel sounds are normal.     Palpations: Abdomen is soft.  Musculoskeletal:        General: Normal range of motion.     Cervical back: Normal range of motion.  Skin:    General: Skin is warm and dry.  Neurological:     Mental Status: She is alert and oriented to person, place, and time.     Coordination: Coordination normal.  Psychiatric:        Behavior: Behavior  normal. Behavior is cooperative.        Thought Content: Thought content normal.        Judgment: Judgment normal.          Patient has been counseled extensively about nutrition and exercise as well as the importance of adherence with medications and regular follow-up. The patient was given clear instructions to go to ER or return to medical center if symptoms don't improve, worsen or new problems develop. The patient verbalized understanding.   Follow-up: Return in about 3 months (around 11/08/2022).   Claiborne Rigg, FNP-BC [facility] and [facility] [place], [place] [phone]   08/08/2022, 11:34 AM

## 2022-08-09 LAB — CMP14+EGFR
ALT: 31 IU/L (ref 0–32)
AST: 29 IU/L (ref 0–40)
Albumin/Globulin Ratio: 1.6 (ref 1.2–2.2)
Albumin: 4.2 g/dL (ref 3.9–4.9)
Alkaline Phosphatase: 87 IU/L (ref 44–121)
BUN/Creatinine Ratio: 18 (ref 9–23)
BUN: 7 mg/dL (ref 6–24)
Bilirubin Total: 0.3 mg/dL (ref 0.0–1.2)
CO2: 20 mmol/L (ref 20–29)
Calcium: 9.3 mg/dL (ref 8.7–10.2)
Chloride: 99 mmol/L (ref 96–106)
Creatinine, Ser: 0.38 mg/dL — ABNORMAL LOW (ref 0.57–1.00)
Globulin, Total: 2.7 g/dL (ref 1.5–4.5)
Glucose: 118 mg/dL — ABNORMAL HIGH (ref 70–99)
Potassium: 4.5 mmol/L (ref 3.5–5.2)
Sodium: 138 mmol/L (ref 134–144)
Total Protein: 6.9 g/dL (ref 6.0–8.5)
eGFR: 123 mL/min/{1.73_m2} (ref >59)

## 2022-08-09 LAB — MICROALBUMIN / CREATININE URINE RATIO
Creatinine, Urine: 28.4 mg/dL
Microalb/Creat Ratio: <11 mg/g creat (ref 0–29)
Microalbumin, Urine: <3 ug/mL

## 2022-08-10 ENCOUNTER — Other Ambulatory Visit: Payer: Self-pay

## 2022-08-11 ENCOUNTER — Other Ambulatory Visit: Payer: Self-pay

## 2022-09-02 ENCOUNTER — Other Ambulatory Visit (HOSPITAL_BASED_OUTPATIENT_CLINIC_OR_DEPARTMENT_OTHER): Payer: Self-pay

## 2022-09-02 ENCOUNTER — Other Ambulatory Visit: Payer: Self-pay

## 2022-09-04 ENCOUNTER — Other Ambulatory Visit: Payer: Self-pay

## 2022-09-07 ENCOUNTER — Other Ambulatory Visit: Payer: Self-pay

## 2022-09-08 ENCOUNTER — Other Ambulatory Visit: Payer: Self-pay

## 2022-09-11 ENCOUNTER — Other Ambulatory Visit: Payer: Self-pay

## 2022-09-13 ENCOUNTER — Other Ambulatory Visit: Payer: Self-pay

## 2022-11-14 ENCOUNTER — Ambulatory Visit: Payer: Medicaid Other | Admitting: Nurse Practitioner

## 2022-11-28 ENCOUNTER — Other Ambulatory Visit: Payer: Self-pay

## 2022-12-05 ENCOUNTER — Other Ambulatory Visit: Payer: Self-pay | Admitting: Nurse Practitioner

## 2022-12-05 DIAGNOSIS — E1165 Type 2 diabetes mellitus with hyperglycemia: Secondary | ICD-10-CM

## 2022-12-05 MED ORDER — OZEMPIC (1 MG/DOSE) 4 MG/3ML ~~LOC~~ SOPN
1.0000 mg | PEN_INJECTOR | SUBCUTANEOUS | 0 refills | Status: DC
Start: 2022-12-05 — End: 2023-02-05
  Filled 2022-12-05 – 2022-12-06 (×2): qty 6, 56d supply, fill #0

## 2022-12-06 ENCOUNTER — Other Ambulatory Visit: Payer: Self-pay

## 2022-12-06 ENCOUNTER — Encounter: Payer: Self-pay | Admitting: Nurse Practitioner

## 2022-12-06 ENCOUNTER — Ambulatory Visit: Payer: Medicaid Other | Attending: Nurse Practitioner | Admitting: Nurse Practitioner

## 2022-12-06 VITALS — BP 107/67 | HR 93 | Ht 64.0 in | Wt 272.8 lb

## 2022-12-06 DIAGNOSIS — Z7984 Long term (current) use of oral hypoglycemic drugs: Secondary | ICD-10-CM | POA: Diagnosis not present

## 2022-12-06 DIAGNOSIS — Z1231 Encounter for screening mammogram for malignant neoplasm of breast: Secondary | ICD-10-CM

## 2022-12-06 DIAGNOSIS — R7989 Other specified abnormal findings of blood chemistry: Secondary | ICD-10-CM | POA: Diagnosis not present

## 2022-12-06 DIAGNOSIS — E785 Hyperlipidemia, unspecified: Secondary | ICD-10-CM | POA: Diagnosis not present

## 2022-12-06 DIAGNOSIS — Z794 Long term (current) use of insulin: Secondary | ICD-10-CM | POA: Diagnosis not present

## 2022-12-06 DIAGNOSIS — E1165 Type 2 diabetes mellitus with hyperglycemia: Secondary | ICD-10-CM

## 2022-12-06 DIAGNOSIS — Z7985 Long-term (current) use of injectable non-insulin antidiabetic drugs: Secondary | ICD-10-CM | POA: Diagnosis not present

## 2022-12-06 LAB — POCT GLYCOSYLATED HEMOGLOBIN (HGB A1C): Hemoglobin A1C: 7.7 % — AB (ref 4.0–5.6)

## 2022-12-06 NOTE — Patient Instructions (Signed)
[facility] ?Located in: [facility] ?Address: [address], [place], [place] [address] ?Phone: [phone] ?

## 2022-12-06 NOTE — Progress Notes (Signed)
Assessment & Plan:  Norma Chambers was seen today for medical management of chronic issues.  Diagnoses and all orders for this visit:  Type 2 diabetes mellitus with hyperglycemia, without long-term current use of insulin (HCC) -     POCT glycosylated hemoglobin (Hb A1C) -     CMP14+EGFR  Abnormal CBC -     CBC with Differential  Dyslipidemia, goal LDL below 70 -     Lipid panel  Breast cancer screening by mammogram -     MM 3D SCREENING MAMMOGRAM BILATERAL BREAST; Future    Patient has been counseled on age-appropriate routine health concerns for screening and prevention. These are reviewed and up-to-date. Referrals have been placed accordingly. Immunizations are up-to-date or declined.    Subjective:   Chief Complaint  Patient presents with   Medical Management of Chronic Issues   HPI Norma Chambers 52 y.o. female presents to office today for follow up to DM.    She has a past medical history of Gestational diabetes, Obesity, and Type 2 diabetes mellitus with hyperglycemia, without long-term current use of insulin.        DM Improving but not at goal of less than 7.  Weight is trending down nicely. Trulicity was switched to Ozempic at her last office visit in May however today she reports [place] has been out of the [place] and has not taken ozempic.  She endorses adherence with taking glipizide, metformin, Lantus 20 units daily and Jardiance. She is taking a renal dose ACE and low intensity statin with LDL at goal Lab Results  Component Value Date   HGBA1C 7.7 (A) 12/06/2022    Lab Results  Component Value Date   HGBA1C 7.9 (A) 08/08/2022    Lab Results  Component Value Date   LDLCALC 52 09/14/2021    Review of Systems  Constitutional:  Negative for fever, malaise/fatigue and weight loss.  HENT: Negative.  Negative for nosebleeds.   Eyes: Negative.  Negative for blurred vision, double vision and photophobia.  Respiratory: Negative.  Negative for cough and shortness of  breath.   Cardiovascular: Negative.  Negative for chest pain, palpitations and leg swelling.  Gastrointestinal: Negative.  Negative for heartburn, nausea and vomiting.  Musculoskeletal: Negative.  Negative for myalgias.  Neurological: Negative.  Negative for dizziness, focal weakness, seizures and headaches.  Psychiatric/Behavioral: Negative.  Negative for suicidal ideas.     Past Medical History:  Diagnosis Date   Gestational diabetes    Obesity    Type 2 diabetes mellitus with hyperglycemia, without long-term current use of insulin (HCC)     Past Surgical History:  Procedure Laterality Date   CESAREAN SECTION     CESAREAN SECTION N/A 11/18/2012   Procedure: REPEAT CESAREAN SECTION;  Surgeon: Catalina Antigua, MD;  Location: [facility];  Service: Obstetrics;  Laterality: N/A;    Family History  Problem Relation Age of Onset   Diabetes Mother    Diabetes Maternal Grandmother    Bell's palsy Neg Hx     Social History Reviewed with no changes to be made today.   Outpatient Medications Prior to Visit  Medication Sig Dispense Refill   Accu-Chek Softclix Lancets lancets Use to inject into the skin twice daily 200 each 3   atorvastatin (LIPITOR) 20 MG tablet Take 1 tablet (20 mg total) by mouth daily. 90 tablet 1   Blood Glucose Monitoring Suppl (ACCU-CHEK GUIDE) w/Device KIT Use as instructed. Check blood glucose level by fingerstick twice per day. 1 kit 0  empagliflozin (JARDIANCE) 25 MG TABS tablet Take 1 tablet (25 mg total) by mouth daily before breakfast. 90 tablet 1   glipiZIDE (GLIPIZIDE XL) 10 MG 24 hr tablet Take 1 tablet (10 mg total) by mouth 2 (two) times daily before a meal. 180 tablet 1   glucose blood (ACCU-CHEK GUIDE) test strip USE AS INSTRUCTED. CHECK BLOOD GLUCOSE LEVEL BY FINGERSTICK TWICE PER DAY. 200 strip 2   insulin glargine (LANTUS SOLOSTAR) 100 UNIT/ML Solostar Pen Inject 20 Units into the skin daily. 15 mL PRN   Insulin Pen Needle (B-D UF III MINI PEN NEEDLES)  31G X 5 MM MISC Use as instructed. Inject into the skin with Lantus. 100 each 3   lisinopril (ZESTRIL) 2.5 MG tablet Take 1 tablet (2.5 mg total) by mouth daily. 90 tablet 1   metFORMIN (GLUCOPHAGE) 1000 MG tablet Take 1 tablet (1,000 mg total) by mouth 2 (two) times daily with a meal. 180 tablet 1   Semaglutide, 1 MG/DOSE, (OZEMPIC, 1 MG/DOSE,) 4 MG/3ML SOPN Inject 1 mg as directed once a week. 6 mL 0   No facility-administered medications prior to visit.    No Known Allergies     Objective:    BP 107/67 (BP Location: Left Arm, Patient Position: Sitting, Cuff Size: Large)   Pulse 93   Ht 5\' 4"  (1.626 m)   Wt 272 lb 12.8 oz (123.7 kg)   SpO2 98%   BMI 46.83 kg/m  Wt Readings from Last 3 Encounters:  12/06/22 272 lb 12.8 oz (123.7 kg)  08/08/22 290 lb 3.2 oz (131.6 kg)  05/10/22 286 lb 12.8 oz (130.1 kg)    Physical Exam Vitals and nursing note reviewed.  Constitutional:      Appearance: She is well-developed.  HENT:     Head: Normocephalic and atraumatic.  Cardiovascular:     Rate and Rhythm: Normal rate and regular rhythm.     Heart sounds: Normal heart sounds. No murmur heard.    No friction rub. No gallop.  Pulmonary:     Effort: Pulmonary effort is normal. No tachypnea or respiratory distress.     Breath sounds: Normal breath sounds. No decreased breath sounds, wheezing, rhonchi or rales.  Chest:     Chest wall: No tenderness.  Abdominal:     General: Bowel sounds are normal.     Palpations: Abdomen is soft.  Musculoskeletal:        General: Normal range of motion.     Cervical back: Normal range of motion.  Skin:    General: Skin is warm and dry.  Neurological:     Mental Status: She is alert and oriented to person, place, and time.     Coordination: Coordination normal.  Psychiatric:        Behavior: Behavior normal. Behavior is cooperative.        Thought Content: Thought content normal.        Judgment: Judgment normal.          Patient has been  counseled extensively about nutrition and exercise as well as the importance of adherence with medications and regular follow-up. The patient was given clear instructions to go to ER or return to medical center if symptoms don't improve, worsen or new problems develop. The patient verbalized understanding.   Follow-up: Return in about 3 months (around 03/07/2023).   Claiborne Rigg, FNP-BC [facility] [place], [place] [phone]   12/06/2022, 12:07 PM

## 2022-12-08 ENCOUNTER — Other Ambulatory Visit: Payer: Self-pay

## 2022-12-19 ENCOUNTER — Other Ambulatory Visit: Payer: Self-pay

## 2022-12-29 ENCOUNTER — Other Ambulatory Visit: Payer: Self-pay

## 2023-01-09 ENCOUNTER — Other Ambulatory Visit: Payer: Self-pay

## 2023-01-29 ENCOUNTER — Ambulatory Visit: Payer: Medicaid Other | Attending: Family Medicine

## 2023-01-29 ENCOUNTER — Other Ambulatory Visit: Payer: Self-pay

## 2023-01-29 DIAGNOSIS — R7989 Other specified abnormal findings of blood chemistry: Secondary | ICD-10-CM | POA: Diagnosis not present

## 2023-01-29 DIAGNOSIS — E1165 Type 2 diabetes mellitus with hyperglycemia: Secondary | ICD-10-CM | POA: Diagnosis not present

## 2023-01-29 DIAGNOSIS — E785 Hyperlipidemia, unspecified: Secondary | ICD-10-CM | POA: Diagnosis not present

## 2023-01-30 LAB — CBC WITH DIFFERENTIAL/PLATELET
Basophils Absolute: 0.1 10*3/uL (ref 0.0–0.2)
Basos: 1 %
EOS (ABSOLUTE): 0.2 10*3/uL (ref 0.0–0.4)
Eos: 2 %
Hematocrit: 45.7 % (ref 34.0–46.6)
Hemoglobin: 14.2 g/dL (ref 11.1–15.9)
Immature Grans (Abs): 0.1 10*3/uL (ref 0.0–0.1)
Immature Granulocytes: 1 %
Lymphocytes Absolute: 2.4 10*3/uL (ref 0.7–3.1)
Lymphs: 26 %
MCH: 26.8 pg (ref 26.6–33.0)
MCHC: 31.1 g/dL — ABNORMAL LOW (ref 31.5–35.7)
MCV: 86 fL (ref 79–97)
Monocytes Absolute: 0.6 10*3/uL (ref 0.1–0.9)
Monocytes: 7 %
Neutrophils Absolute: 5.8 10*3/uL (ref 1.4–7.0)
Neutrophils: 63 %
Platelets: 283 10*3/uL (ref 150–450)
RBC: 5.3 x10E6/uL — ABNORMAL HIGH (ref 3.77–5.28)
RDW: 15 % (ref 11.7–15.4)
WBC: 9.1 10*3/uL (ref 3.4–10.8)

## 2023-01-30 LAB — CMP14+EGFR
ALT: 40 [IU]/L — ABNORMAL HIGH (ref 0–32)
AST: 34 [IU]/L (ref 0–40)
Albumin: 4.3 g/dL (ref 3.9–4.9)
Alkaline Phosphatase: 68 [IU]/L (ref 44–121)
BUN/Creatinine Ratio: 14 (ref 9–23)
BUN: 10 mg/dL (ref 6–24)
Bilirubin Total: 0.2 mg/dL (ref 0.0–1.2)
CO2: 21 mmol/L (ref 20–29)
Calcium: 9.1 mg/dL (ref 8.7–10.2)
Chloride: 104 mmol/L (ref 96–106)
Creatinine, Ser: 0.69 mg/dL (ref 0.57–1.00)
Globulin, Total: 2.7 g/dL (ref 1.5–4.5)
Glucose: 137 mg/dL — ABNORMAL HIGH (ref 70–99)
Potassium: 4.7 mmol/L (ref 3.5–5.2)
Sodium: 141 mmol/L (ref 134–144)
Total Protein: 7 g/dL (ref 6.0–8.5)
eGFR: 106 mL/min/{1.73_m2} (ref >59)

## 2023-01-30 LAB — LIPID PANEL
Chol/HDL Ratio: 3 {ratio} (ref 0.0–4.4)
Cholesterol, Total: 115 mg/dL (ref 100–199)
HDL: 38 mg/dL — ABNORMAL LOW (ref >39)
LDL Chol Calc (NIH): 58 mg/dL (ref 0–99)
Triglycerides: 101 mg/dL (ref 0–149)
VLDL Cholesterol Cal: 19 mg/dL (ref 5–40)

## 2023-02-05 ENCOUNTER — Other Ambulatory Visit: Payer: Self-pay | Admitting: Family Medicine

## 2023-02-05 ENCOUNTER — Other Ambulatory Visit: Payer: Self-pay | Admitting: Nurse Practitioner

## 2023-02-05 DIAGNOSIS — E1165 Type 2 diabetes mellitus with hyperglycemia: Secondary | ICD-10-CM

## 2023-02-05 IMAGING — MG DIGITAL DIAGNOSTIC BILAT W/ TOMO W/ CAD
8 of 11 series · 8 of 27 positions shown · non-contrast
Comparison: Baseline mammogram.

CLINICAL DATA: Ulceration of the inframammary fold region of the
RIGHT breast which started [REDACTED]. Patient has been treated with
antibiotics, nystatin cream, and more recently, Bactroban ointment.
She changes dressings up to 3 times daily. Patient is an insulin
dependent diabetic.

EXAM:
DIGITAL DIAGNOSTIC BILATERAL MAMMOGRAM WITH TOMOSYNTHESIS AND CAD;
ULTRASOUND RIGHT BREAST LIMITED
TECHNIQUE: Bilateral digital diagnostic mammography and breast tomosynthesis
was performed. The images were evaluated with computer-aided
detection.; Targeted ultrasound examination of the right breast was
performed

[R MLO synth-2D (1 of 2)]
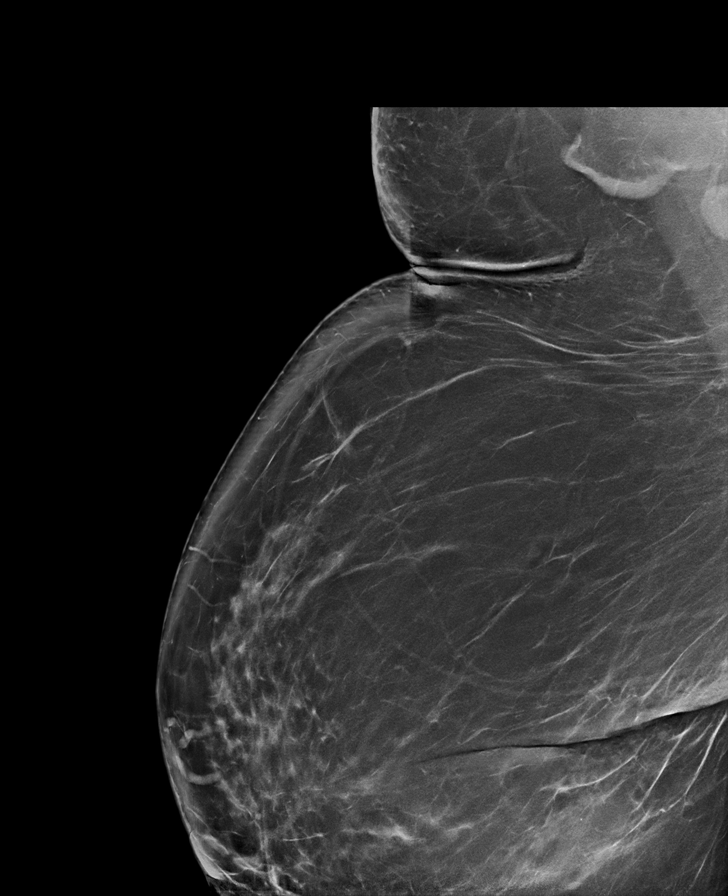

[R CC synth-2D (1 of 2)]
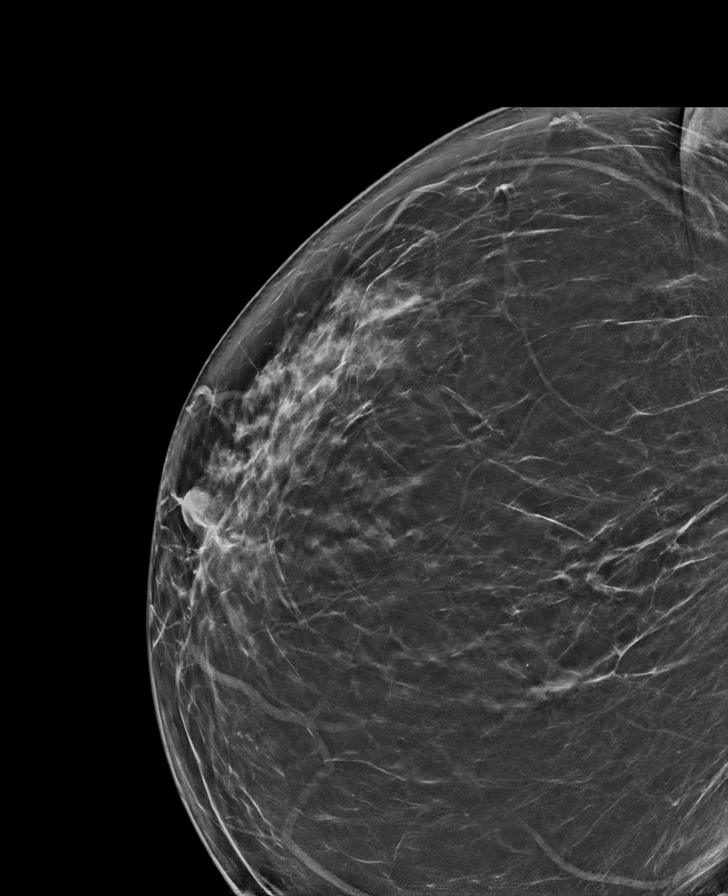

[L MLO synth-2D (1 of 2)]
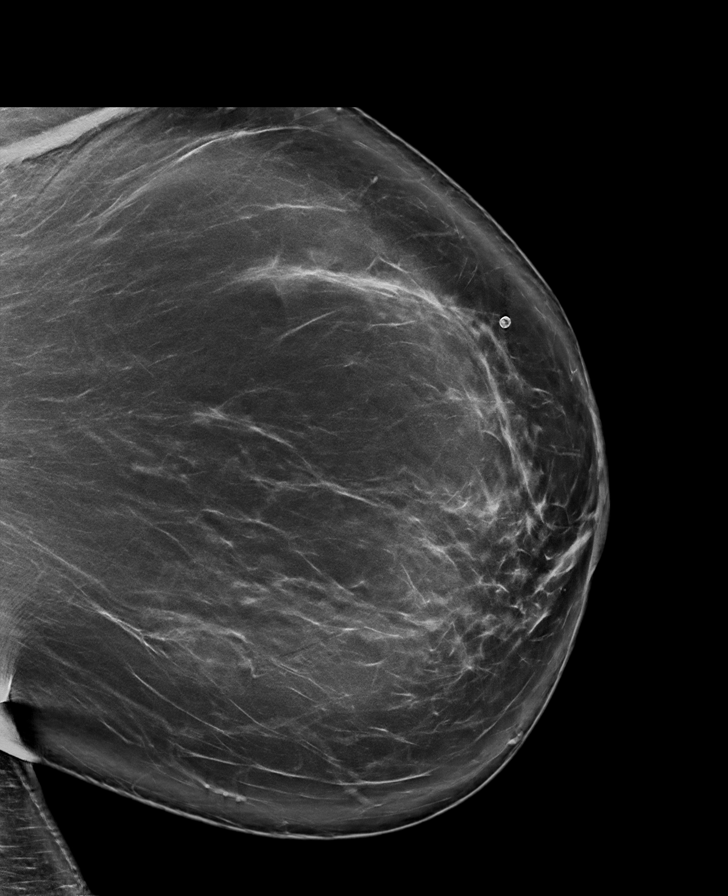

[R MLO synth-2D (2 of 2)]
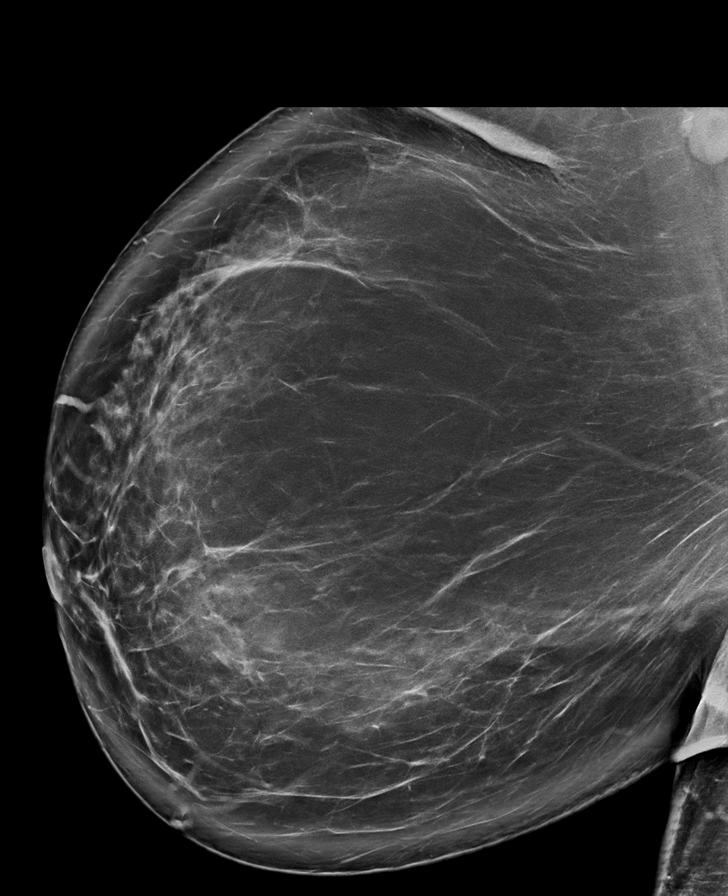

[L MLO synth-2D (2 of 2)]
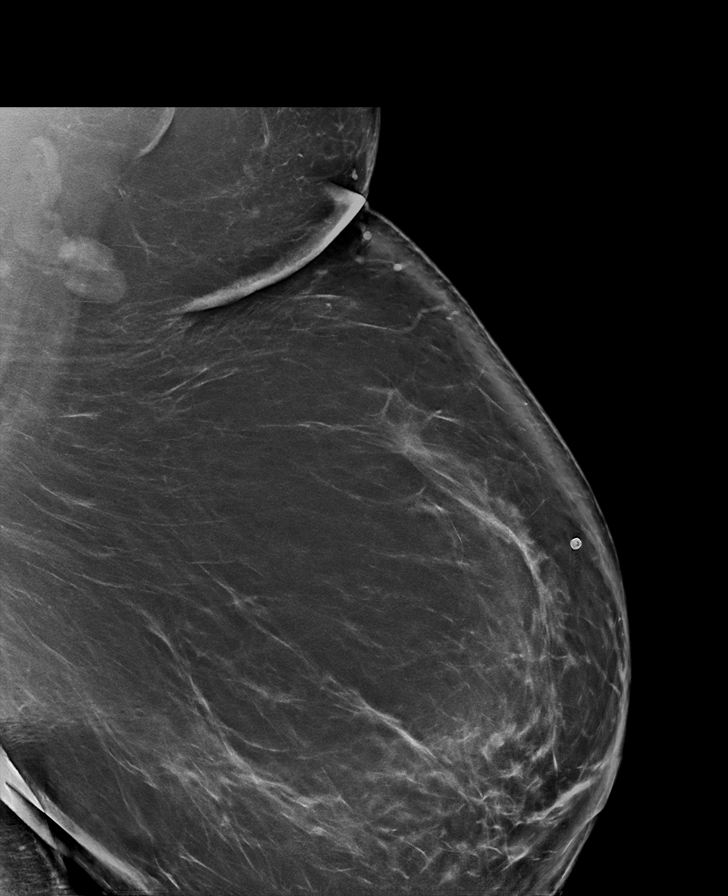

[R CC synth-2D (2 of 2)]
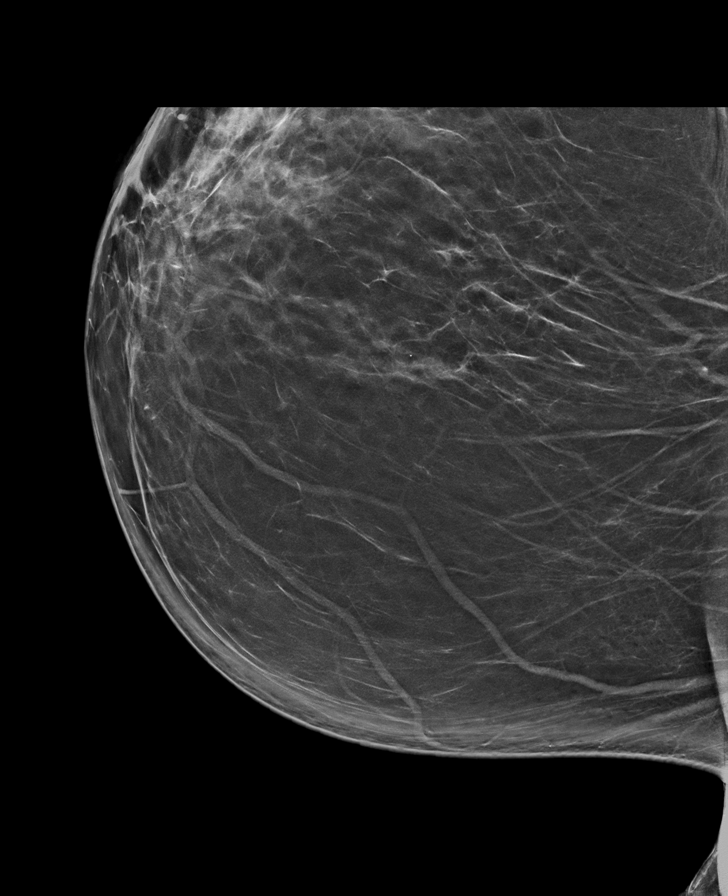

[L CC synth-2D]
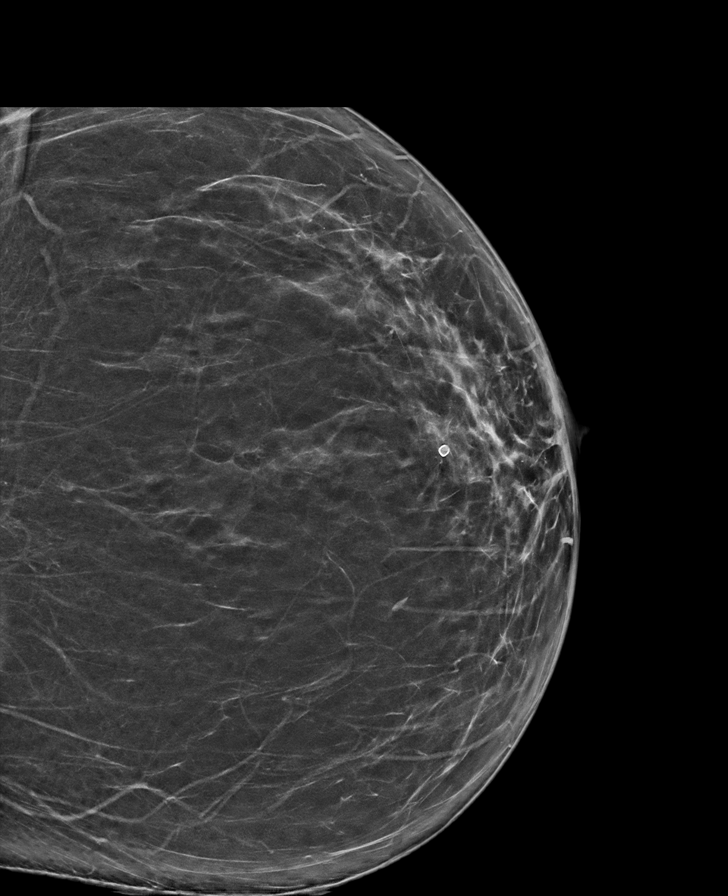

[L MLO tomo · tomo slice 57/113.0]
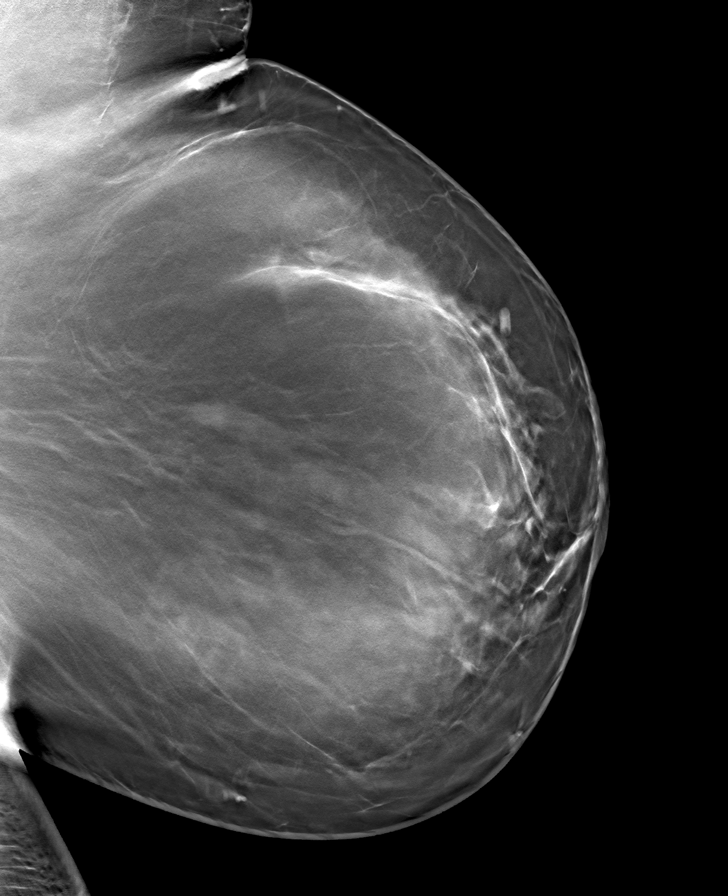

[8 of 27 positions shown; findings below may reference images not displayed]

ACR Breast Density Category b: There are scattered areas of
fibroglandular density.
FINDINGS: No suspicious mass, distortion, or microcalcifications are
identified to suggest presence of malignancy.

On physical exam, in the inframammary fold region of the RIGHT
breast, there is a circumscribed oval area of denuded skin, weeping
clear fluid and small amount of bloody discharge. Lesion measures up
to 8 centimeters in diameter. There is no underlying palpable mass.

Targeted ultrasound is performed, showing focally thickened skin in
the area of the lesion. Otherwise, no parenchymal abnormality
identified. No fluid collections.
IMPRESSION: 1.  No mammographic or ultrasound evidence for malignancy.
2. Patient has been referred to [REDACTED] for evaluation of
RIGHT breast lesion.

RECOMMENDATION:
Screening mammogram in one year.(Code:AC-E-7HU)

I have discussed the findings and recommendations with the patient.
If applicable, a reminder letter will be sent to the patient
regarding the next appointment.

BI-RADS CATEGORY  2: Benign.

## 2023-02-06 ENCOUNTER — Other Ambulatory Visit: Payer: Self-pay

## 2023-02-06 MED ORDER — EMPAGLIFLOZIN 25 MG PO TABS
25.0000 mg | ORAL_TABLET | Freq: Every day | ORAL | 1 refills | Status: DC
Start: 2023-02-06 — End: 2023-08-06
  Filled 2023-02-06: qty 90, 90d supply, fill #0
  Filled 2023-05-09: qty 90, 90d supply, fill #1

## 2023-02-06 MED ORDER — OZEMPIC (1 MG/DOSE) 4 MG/3ML ~~LOC~~ SOPN
1.0000 mg | PEN_INJECTOR | SUBCUTANEOUS | 0 refills | Status: DC
Start: 2023-02-06 — End: 2023-04-18
  Filled 2023-02-06: qty 6, 56d supply, fill #0

## 2023-02-08 ENCOUNTER — Encounter (HOSPITAL_COMMUNITY): Payer: Self-pay | Admitting: Emergency Medicine

## 2023-02-08 ENCOUNTER — Ambulatory Visit (HOSPITAL_COMMUNITY)
Admission: EM | Admit: 2023-02-08 | Discharge: 2023-02-08 | Disposition: A | Discharge status: Home / Self Care | Payer: Medicaid Other | Attending: Family Medicine | Admitting: Family Medicine

## 2023-02-08 DIAGNOSIS — S2001XA Contusion of right breast, initial encounter: Secondary | ICD-10-CM | POA: Diagnosis not present

## 2023-02-08 NOTE — Discharge Instructions (Signed)
You have a contusion of the soft tissue. Continue to ice this area for 15-20 minutes at a time multiple times a day. Your symptoms should continue to improve over the next 1-2 weeks. If your symptoms do not improve or worsen you will need to follow-up with your primary care provider for possibly a diagnostic ultrasound. The possible complications of this can be fat necrosis of the breast which is why we will need to monitor your symptoms. You can continue Tylenol/ibuprofen for pain control. Apply ice to the left side of the face for muscular pain as well.
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
## 2023-02-13 ENCOUNTER — Other Ambulatory Visit: Payer: Self-pay

## 2023-03-06 ENCOUNTER — Other Ambulatory Visit: Payer: Self-pay | Admitting: Nurse Practitioner

## 2023-03-06 DIAGNOSIS — E1165 Type 2 diabetes mellitus with hyperglycemia: Secondary | ICD-10-CM

## 2023-03-07 ENCOUNTER — Ambulatory Visit: Payer: Medicaid Other | Admitting: Nurse Practitioner

## 2023-03-07 ENCOUNTER — Other Ambulatory Visit: Payer: Self-pay

## 2023-03-07 MED ORDER — LISINOPRIL 2.5 MG PO TABS
2.5000 mg | ORAL_TABLET | Freq: Every day | ORAL | 0 refills | Status: DC
  Filled 2023-03-07: qty 90, 90d supply, fill #0

## 2023-03-13 ENCOUNTER — Other Ambulatory Visit: Payer: Self-pay

## 2023-03-16 ENCOUNTER — Other Ambulatory Visit (HOSPITAL_COMMUNITY): Payer: Self-pay

## 2023-03-16 ENCOUNTER — Other Ambulatory Visit: Payer: Self-pay | Admitting: Nurse Practitioner

## 2023-03-16 DIAGNOSIS — E1169 Type 2 diabetes mellitus with other specified complication: Secondary | ICD-10-CM

## 2023-03-16 DIAGNOSIS — E1165 Type 2 diabetes mellitus with hyperglycemia: Secondary | ICD-10-CM

## 2023-03-16 MED ORDER — METFORMIN HCL 1000 MG PO TABS
1000.0000 mg | ORAL_TABLET | Freq: Two times a day (BID) | ORAL | 1 refills | Status: DC
  Filled 2023-03-16: qty 180, 90d supply, fill #0
  Filled 2023-06-11: qty 180, 90d supply, fill #1
  Filled 2023-06-20: qty 180, 90d supply, fill #0

## 2023-03-16 MED ORDER — ATORVASTATIN CALCIUM 20 MG PO TABS
20.0000 mg | ORAL_TABLET | Freq: Every day | ORAL | 1 refills | Status: DC
  Filled 2023-03-16: qty 90, 90d supply, fill #0

## 2023-03-16 NOTE — Telephone Encounter (Signed)
Requested Prescriptions  Pending Prescriptions Disp Refills   metFORMIN (GLUCOPHAGE) 1000 MG tablet 180 tablet 1    Sig: Take 1 tablet (1,000 mg total) by mouth 2 (two) times daily with a meal.     Endocrinology:  Diabetes - Biguanides Failed - 03/16/2023  3:10 PM      Failed - B12 Level in normal range and within 720 days    No results found for: "VITAMINB12"       Passed - Cr in normal range and within 360 days    Creat  Date Value Ref Range Status  07/09/2015 0.43 (L) 0.50 - 1.10 mg/dL Final   Creatinine, Ser  Date Value Ref Range Status  01/29/2023 0.69 0.57 - 1.00 mg/dL Final   Creatinine, POC  Date Value Ref Range Status  05/21/2017 300 mg/dL Final   Creatinine, Urine  Date Value Ref Range Status  03/06/2016 37 20 - 320 mg/dL Final         Passed - HBA1C is between 0 and 7.9 and within 180 days    Hemoglobin A1C  Date Value Ref Range Status  12/06/2022 7.7 (A) 4.0 - 5.6 % Final   HbA1c, POC (controlled diabetic range)  Date Value Ref Range Status  05/10/2022 7.5 (A) 0.0 - 7.0 % Final         Passed - eGFR in normal range and within 360 days    GFR, Est African American  Date Value Ref Range Status  07/09/2015 >89 >=60 mL/min Final   GFR calc Af Amer  Date Value Ref Range Status  03/19/2020 128 >59 mL/min/1.73 Final    Comment:    **In accordance with recommendations from the NKF-ASN Task force,**   [facility] is in the process of updating its eGFR calculation to the   2021 CKD-EPI creatinine equation that estimates kidney function   without a race variable.    GFR, Est Non African American  Date Value Ref Range Status  07/09/2015 >89 >=60 mL/min Final    Comment:      The estimated GFR is a calculation valid for adults (>=73 years old) that uses the CKD-EPI algorithm to adjust for age and sex. It is   not to be used for children, pregnant women, hospitalized patients,    patients on dialysis, or with rapidly changing kidney function. According to the  NKDEP, eGFR >89 is normal, 60-89 shows mild impairment, 30-59 shows moderate impairment, 15-29 shows severe impairment and <15 is ESRD.      GFR calc non Af Amer  Date Value Ref Range Status  03/19/2020 111 >59 mL/min/1.73 Final   eGFR  Date Value Ref Range Status  01/29/2023 106 >59 mL/min/1.73 Final         Passed - Valid encounter within last 6 months    Recent Outpatient Visits           3 months ago Type 2 diabetes mellitus with hyperglycemia, without long-term current use of insulin (HCC)   [place] [facility] - [facility] Dept Of [place]. [facility] [place], [place] W, NP   7 months ago Type 2 diabetes mellitus with hyperglycemia, without long-term current use of insulin (HCC)   [place] [facility] - A Dept Of [place]. [facility] [place], [place] W, NP   10 months ago Type 2 diabetes mellitus with hyperglycemia, without long-term current use of insulin (HCC)    [facility] - A Dept Of [place] Dolly  [facility], [place], [place]   1 year ago Type 2 diabetes mellitus with hyperglycemia, without long-term current use of insulin [facility])   [place] [facility] - A Dept Of [place]. [facility] Claiborne Rigg, NP   1 year ago Skin ulcer of female breast [facility])   [place] [facility] - A Dept Of [place]. [facility] Claiborne Rigg, NP       Future Appointments             In 1 month Claiborne Rigg, NP [facility] - A Dept Of [place]. [facility]            Passed - CBC within normal limits and completed in the last 12 months    WBC  Date Value Ref Range Status  01/29/2023 9.1 3.4 - 10.8 x10E3/uL Final  12/01/2017 10.7 (H) 4.0 - 10.5 K/uL Final   RBC  Date Value Ref Range Status  01/29/2023 5.30 (H) 3.77 - 5.28 x10E6/uL Final  12/01/2017 4.85 3.87 - 5.11 MIL/uL Final   Hemoglobin  Date Value Ref Range Status  01/29/2023  14.2 11.1 - 15.9 g/dL Final  16/01/9603 54.0 g/dL Final   HCT  Date Value Ref Range Status  08/05/2012 36 % Final   Hematocrit  Date Value Ref Range Status  01/29/2023 45.7 34.0 - 46.6 % Final   MCHC  Date Value Ref Range Status  01/29/2023 31.1 (L) 31.5 - 35.7 g/dL Final  98/02/9146 82.9 30.0 - 36.0 g/dL Final   [facility]  Date Value Ref Range Status  01/29/2023 26.8 26.6 - 33.0 pg Final  12/01/2017 28.2 26.0 - 34.0 pg Final   MCV  Date Value Ref Range Status  01/29/2023 86 79 - 97 fL Final   No results found for: "PLTCOUNTKUC", "LABPLAT", "POCPLA" RDW  Date Value Ref Range Status  01/29/2023 15.0 11.7 - 15.4 % Final          atorvastatin (LIPITOR) 20 MG tablet 90 tablet 1    Sig: Take 1 tablet (20 mg total) by mouth daily.     Cardiovascular:  Antilipid - Statins Failed - 03/16/2023  3:10 PM      Failed - Lipid Panel in normal range within the last 12 months    Cholesterol, Total  Date Value Ref Range Status  01/29/2023 115 100 - 199 mg/dL Final   LDL Chol Calc (NIH)  Date Value Ref Range Status  01/29/2023 58 0 - 99 mg/dL Final   HDL  Date Value Ref Range Status  01/29/2023 38 (L) >39 mg/dL Final   Triglycerides  Date Value Ref Range Status  01/29/2023 101 0 - 149 mg/dL Final         Passed - Patient is not pregnant      Passed - Valid encounter within last 12 months    Recent Outpatient Visits           3 months ago Type 2 diabetes mellitus with hyperglycemia, without long-term current use of insulin (HCC)   [place] [facility] - A Dept Of [place]. [facility] [place], [place] W, NP   7 months ago Type 2 diabetes mellitus with hyperglycemia, without long-term current use of insulin (HCC)   [place] [facility] - A Dept Of [place]. [facility] [place], [place] W, NP   10 months ago Type 2 diabetes  mellitus with hyperglycemia, without long-term current use of insulin (HCC)   [place] [facility] - A  Dept Of [place]. [facility] [place], [place], [place]   1 year ago Type 2 diabetes mellitus with hyperglycemia, without long-term current use of insulin [facility])   [place] [facility] - A Dept Of [place]. [facility] Claiborne Rigg, NP   1 year ago Skin ulcer of female breast [facility])   [place] [facility] - A Dept Of [place]. [facility] Claiborne Rigg, NP       Future Appointments             In 1 month Claiborne Rigg, NP [facility] - A Dept Of [place]. [facility]

## 2023-03-17 ENCOUNTER — Other Ambulatory Visit (HOSPITAL_COMMUNITY): Payer: Self-pay

## 2023-04-02 ENCOUNTER — Other Ambulatory Visit: Payer: Self-pay | Admitting: Nurse Practitioner

## 2023-04-02 ENCOUNTER — Other Ambulatory Visit: Payer: Self-pay

## 2023-04-02 DIAGNOSIS — E1165 Type 2 diabetes mellitus with hyperglycemia: Secondary | ICD-10-CM

## 2023-04-02 MED ORDER — GLIPIZIDE ER 10 MG PO TB24
10.0000 mg | ORAL_TABLET | Freq: Two times a day (BID) | ORAL | 0 refills | Status: DC
  Filled 2023-04-02: qty 60, 30d supply, fill #0

## 2023-04-05 ENCOUNTER — Other Ambulatory Visit: Payer: Self-pay

## 2023-04-16 ENCOUNTER — Other Ambulatory Visit: Payer: Self-pay | Admitting: Nurse Practitioner

## 2023-04-16 ENCOUNTER — Other Ambulatory Visit: Payer: Self-pay

## 2023-04-16 DIAGNOSIS — E1165 Type 2 diabetes mellitus with hyperglycemia: Secondary | ICD-10-CM

## 2023-04-16 MED ORDER — ACCU-CHEK SOFTCLIX LANCETS MISC
6 refills | Status: AC
  Filled 2023-04-16: qty 100, 30d supply, fill #0
  Filled 2023-11-05: qty 100, 30d supply, fill #1
  Filled 2024-03-10: qty 100, 30d supply, fill #2

## 2023-04-16 MED ORDER — ACCU-CHEK GUIDE W/DEVICE KIT
PACK | 0 refills | Status: AC
  Filled 2023-04-16: qty 1, 30d supply, fill #0

## 2023-04-16 MED ORDER — ACCU-CHEK GUIDE TEST VI STRP
ORAL_STRIP | 6 refills | Status: DC
  Filled 2023-04-16: qty 100, 34d supply, fill #0
  Filled 2023-05-19 – 2023-05-30 (×2): qty 100, 34d supply, fill #1
  Filled 2023-07-09: qty 100, 34d supply, fill #2
  Filled 2023-09-01 – 2023-09-03 (×2): qty 100, 34d supply, fill #3
  Filled 2023-10-16: qty 100, 34d supply, fill #4
  Filled 2023-12-04: qty 100, 34d supply, fill #5
  Filled 2024-02-01: qty 100, 34d supply, fill #6

## 2023-04-17 ENCOUNTER — Other Ambulatory Visit: Payer: Self-pay

## 2023-04-18 ENCOUNTER — Other Ambulatory Visit: Payer: Self-pay | Admitting: Nurse Practitioner

## 2023-04-18 DIAGNOSIS — E1165 Type 2 diabetes mellitus with hyperglycemia: Secondary | ICD-10-CM

## 2023-04-19 ENCOUNTER — Other Ambulatory Visit: Payer: Self-pay

## 2023-04-19 ENCOUNTER — Ambulatory Visit
Admission: RE | Admit: 2023-04-19 | Discharge: 2023-04-19 | Disposition: A | Discharge status: Home / Self Care | Payer: Medicaid Other | Source: Ambulatory Visit | Attending: Nurse Practitioner | Admitting: Nurse Practitioner

## 2023-04-19 DIAGNOSIS — Z1231 Encounter for screening mammogram for malignant neoplasm of breast: Secondary | ICD-10-CM | POA: Diagnosis not present

## 2023-04-19 MED ORDER — OZEMPIC (1 MG/DOSE) 4 MG/3ML ~~LOC~~ SOPN
1.0000 mg | PEN_INJECTOR | SUBCUTANEOUS | 0 refills | Status: DC
  Filled 2023-04-19 – 2023-05-07 (×2): qty 6, 56d supply, fill #0

## 2023-04-19 NOTE — Telephone Encounter (Signed)
Requested Prescriptions  Pending Prescriptions Disp Refills   Semaglutide, 1 MG/DOSE, (OZEMPIC, 1 MG/DOSE,) 4 MG/3ML SOPN 6 mL 0    Sig: Inject 1 mg as directed once a week.     Endocrinology:  Diabetes - GLP-1 Receptor Agonists - semaglutide Failed - 04/19/2023 10:48 AM      Failed - HBA1C in normal range and within 180 days    Hemoglobin A1C  Date Value Ref Range Status  12/06/2022 7.7 (A) 4.0 - 5.6 % Final   HbA1c, POC (controlled diabetic range)  Date Value Ref Range Status  05/10/2022 7.5 (A) 0.0 - 7.0 % Final         Passed - Cr in normal range and within 360 days    Creat  Date Value Ref Range Status  07/09/2015 0.43 (L) 0.50 - 1.10 mg/dL Final   Creatinine, Ser  Date Value Ref Range Status  01/29/2023 0.69 0.57 - 1.00 mg/dL Final   Creatinine, POC  Date Value Ref Range Status  05/21/2017 300 mg/dL Final   Creatinine, Urine  Date Value Ref Range Status  03/06/2016 37 20 - 320 mg/dL Final         Passed - Valid encounter within last 6 months    Recent Outpatient Visits           4 months ago Type 2 diabetes mellitus with hyperglycemia, without long-term current use of insulin (HCC)   [place] [facility] - A Dept Of [place]. [facility] [place], [place] W, NP   8 months ago Type 2 diabetes mellitus with hyperglycemia, without long-term current use of insulin (HCC)   [place] [facility] - A Dept Of [place]. [facility] [place], [place] W, NP   11 months ago Type 2 diabetes mellitus with hyperglycemia, without long-term current use of insulin (HCC)   [place] [facility] - A Dept Of [place]. [facility] [place], [place], [place]   1 year ago Type 2 diabetes mellitus with hyperglycemia, without long-term current use of insulin [facility])   [place] [facility] - A Dept Of [place]. [facility] Claiborne Rigg, NP   1 year ago Skin ulcer of female breast [facility])   [place] [facility] - A Dept Of [place]. [facility] Claiborne Rigg, NP       Future Appointments             In 2 weeks Claiborne Rigg, NP [facility] - A Dept Of Eligha Bridegroom. [facility]

## 2023-04-24 ENCOUNTER — Encounter: Payer: Self-pay | Admitting: Nurse Practitioner

## 2023-04-28 ENCOUNTER — Other Ambulatory Visit: Payer: Self-pay | Admitting: Nurse Practitioner

## 2023-04-28 DIAGNOSIS — E1165 Type 2 diabetes mellitus with hyperglycemia: Secondary | ICD-10-CM

## 2023-04-30 ENCOUNTER — Other Ambulatory Visit: Payer: Self-pay

## 2023-04-30 NOTE — Telephone Encounter (Signed)
Requested by interface surescripts.  Last dispensed 04/05/23. Too soon for refill. Future visi tin 1 week.  Requested Prescriptions  Refused Prescriptions Disp Refills   glipiZIDE (GLIPIZIDE XL) 10 MG 24 hr tablet 60 tablet 0    Sig: Take 1 tablet (10 mg total) by mouth 2 (two) times daily before a meal. Must have office visit for additional refills.     Endocrinology:  Diabetes - Sulfonylureas Passed - 04/30/2023  2:49 PM      Passed - HBA1C is between 0 and 7.9 and within 180 days    Hemoglobin A1C  Date Value Ref Range Status  12/06/2022 7.7 (A) 4.0 - 5.6 % Final   HbA1c, POC (controlled diabetic range)  Date Value Ref Range Status  05/10/2022 7.5 (A) 0.0 - 7.0 % Final         Passed - Cr in normal range and within 360 days    Creat  Date Value Ref Range Status  07/09/2015 0.43 (L) 0.50 - 1.10 mg/dL Final   Creatinine, Ser  Date Value Ref Range Status  01/29/2023 0.69 0.57 - 1.00 mg/dL Final   Creatinine, POC  Date Value Ref Range Status  05/21/2017 300 mg/dL Final   Creatinine, Urine  Date Value Ref Range Status  03/06/2016 37 20 - 320 mg/dL Final         Passed - Valid encounter within last 6 months    Recent Outpatient Visits           4 months ago Type 2 diabetes mellitus with hyperglycemia, without long-term current use of insulin (HCC)   [place] [facility] - A Dept Of [place]. [facility] [place], [place] W, NP   8 months ago Type 2 diabetes mellitus with hyperglycemia, without long-term current use of insulin (HCC)   [place] [facility] - A Dept Of [place]. [facility] [place], [place] W, NP   11 months ago Type 2 diabetes mellitus with hyperglycemia, without long-term current use of insulin (HCC)   [place] [facility] - A Dept Of [place]. [facility] [place], [place], [place]   1 year ago Type 2 diabetes mellitus with hyperglycemia, without long-term current use of insulin [facility])   [place] [facility] - A Dept Of [place]. [facility] Claiborne Rigg, NP   1 year ago Skin ulcer of female breast [facility])   [place] [facility] - A Dept Of [place]. [facility] Claiborne Rigg, NP       Future Appointments             In 1 week Claiborne Rigg, NP [facility] - A Dept Of Eligha Bridegroom. [facility]

## 2023-05-07 ENCOUNTER — Other Ambulatory Visit: Payer: Self-pay | Admitting: Family Medicine

## 2023-05-07 ENCOUNTER — Other Ambulatory Visit: Payer: Self-pay | Admitting: Nurse Practitioner

## 2023-05-07 ENCOUNTER — Ambulatory Visit: Payer: Medicaid Other | Attending: Nurse Practitioner | Admitting: Nurse Practitioner

## 2023-05-07 ENCOUNTER — Other Ambulatory Visit: Payer: Self-pay

## 2023-05-07 ENCOUNTER — Encounter: Payer: Self-pay | Admitting: Nurse Practitioner

## 2023-05-07 VITALS — BP 123/74 | HR 91 | Resp 19 | Ht 64.0 in | Wt 280.0 lb

## 2023-05-07 DIAGNOSIS — E119 Type 2 diabetes mellitus without complications: Secondary | ICD-10-CM | POA: Diagnosis not present

## 2023-05-07 DIAGNOSIS — E1165 Type 2 diabetes mellitus with hyperglycemia: Secondary | ICD-10-CM

## 2023-05-07 DIAGNOSIS — E1169 Type 2 diabetes mellitus with other specified complication: Secondary | ICD-10-CM | POA: Diagnosis not present

## 2023-05-07 DIAGNOSIS — Z7985 Long-term (current) use of injectable non-insulin antidiabetic drugs: Secondary | ICD-10-CM | POA: Diagnosis not present

## 2023-05-07 DIAGNOSIS — E785 Hyperlipidemia, unspecified: Secondary | ICD-10-CM | POA: Diagnosis not present

## 2023-05-07 LAB — POCT GLYCOSYLATED HEMOGLOBIN (HGB A1C): Hemoglobin A1C: 8.2 % — AB (ref 4.0–5.6)

## 2023-05-07 MED ORDER — LISINOPRIL 2.5 MG PO TABS
2.5000 mg | ORAL_TABLET | Freq: Every day | ORAL | 1 refills | Status: DC
  Filled 2023-05-07 – 2023-06-04 (×10): qty 90, 90d supply, fill #0
  Filled 2023-09-01: qty 90, 90d supply, fill #1

## 2023-05-07 MED ORDER — GLIPIZIDE ER 10 MG PO TB24
10.0000 mg | ORAL_TABLET | Freq: Two times a day (BID) | ORAL | 1 refills | Status: DC
  Filled 2023-05-07: qty 180, 90d supply, fill #0
  Filled 2023-06-04 – 2023-08-06 (×2): qty 180, 90d supply, fill #1

## 2023-05-07 MED ORDER — ATORVASTATIN CALCIUM 20 MG PO TABS
20.0000 mg | ORAL_TABLET | Freq: Every day | ORAL | 1 refills | Status: DC
  Filled 2023-05-07 – 2023-06-04 (×10): qty 90, 90d supply, fill #0
  Filled 2023-09-01: qty 90, 90d supply, fill #1

## 2023-05-07 MED ORDER — SEMAGLUTIDE (2 MG/DOSE) 8 MG/3ML ~~LOC~~ SOPN
2.0000 mg | PEN_INJECTOR | SUBCUTANEOUS | 6 refills | Status: DC
  Filled 2023-05-07: qty 3, 28d supply, fill #0
  Filled 2023-05-07: qty 3, 30d supply, fill #0
  Filled 2023-06-04: qty 3, 28d supply, fill #1
  Filled 2023-07-09: qty 3, 28d supply, fill #2
  Filled 2023-08-06 (×2): qty 3, 28d supply, fill #3
  Filled 2023-09-14: qty 3, 28d supply, fill #4
  Filled 2023-10-16: qty 3, 28d supply, fill #5
  Filled 2023-12-04: qty 3, 28d supply, fill #6

## 2023-05-07 NOTE — Progress Notes (Signed)
Assessment & Plan:  Norma Chambers was seen today for medical management of chronic issues.  Diagnoses and all orders for this visit:  Type 2 diabetes mellitus with hyperglycemia, without long-term current use of insulin  Ozempic increased today from 1 mg to 2 mg weekly.  Continue all other medications as prescribed -     POCT glycosylated hemoglobin (Hb A1C) -     glipiZIDE (GLIPIZIDE XL) 10 MG 24 hr tablet; Take 1 tablet (10 mg total) by mouth 2 (two) times daily before a meal. Must have office visit for additional refills. -     lisinopril (ZESTRIL) 2.5 MG tablet; Take 1 tablet (2.5 mg total) by mouth daily. -     Semaglutide, 2 MG/DOSE, 8 MG/3ML SOPN; Inject 2 mg as directed once a week. Continue blood sugar control as discussed in office today, low carbohydrate diet, and regular physical exercise as tolerated, 150 minutes per week (30 min each day, 5 days per week, or 50 min 3 days per week). Keep blood sugar logs with fasting goal of 90-130 mg/dl, post prandial (after you eat) less than 180.  For Hypoglycemia: BS <60 and Hyperglycemia BS >400; contact the clinic ASAP. Annual eye exams and foot exams are recommended.   Hyperlipidemia associated with type 2 diabetes mellitus (HCC) -     atorvastatin (LIPITOR) 20 MG tablet; Take 1 tablet (20 mg total) by mouth daily. INSTRUCTIONS: Work on a low fat, heart healthy diet and participate in regular aerobic exercise program by working out at least 150 minutes per week; 5 days a week-30 minutes per day. Avoid red meat/beef/steak,  fried foods. junk foods, sodas, sugary drinks, unhealthy snacking, alcohol and smoking.  Drink at least 80 oz of water per day and monitor your carbohydrate intake daily.      Patient has been counseled on age-appropriate routine health concerns for screening and prevention. These are reviewed and up-to-date. Referrals have been placed accordingly. Immunizations are up-to-date or declined.    Subjective:   Chief Complaint   Patient presents with   Medical Management of Chronic Issues    Norma Chambers 53 y.o. female presents to office today for follow up to DM   She has a past medical history of Gestational diabetes, Obesity, and Type 2 diabetes    A1c has increased from 7.7 to 8.1. Weight is also up 10lbs since last visit.  She is taking all medications as prescribed. Will increase ozempic today from 1 to 2 mg weekly. She will continue on metformin 1000 mg twice daily, Lantus 20 units daily, Jardiance 25 mg daily and glipizide 10 mg twice daily Lab Results  Component Value Date   HGBA1C 8.2 (A) 05/07/2023  LDL at goal with atorvastatin 20 mg daily. Lab Results  Component Value Date   LDLCALC 58 01/29/2023    Review of Systems  Constitutional:  Negative for fever, malaise/fatigue and weight loss.  HENT: Negative.  Negative for nosebleeds.   Eyes: Negative.  Negative for blurred vision, double vision and photophobia.  Respiratory: Negative.  Negative for cough and shortness of breath.   Cardiovascular: Negative.  Negative for chest pain, palpitations and leg swelling.  Gastrointestinal: Negative.  Negative for heartburn, nausea and vomiting.  Musculoskeletal: Negative.  Negative for myalgias.  Neurological: Negative.  Negative for dizziness, focal weakness, seizures and headaches.  Psychiatric/Behavioral: Negative.  Negative for suicidal ideas.     Past Medical History:  Diagnosis Date   Gestational diabetes    Obesity  Type 2 diabetes mellitus with hyperglycemia, without long-term current use of insulin (HCC)     Past Surgical History:  Procedure Laterality Date   CESAREAN SECTION     CESAREAN SECTION N/A 11/18/2012   Procedure: REPEAT CESAREAN SECTION;  Surgeon: Catalina Antigua, MD;  Location: [facility];  Service: Obstetrics;  Laterality: N/A;    Family History  Problem Relation Age of Onset   Diabetes Mother    Diabetes Maternal Grandmother    Bell's palsy Neg Hx     Social History  Reviewed with no changes to be made today.   Outpatient Medications Prior to Visit  Medication Sig Dispense Refill   Accu-Chek Softclix Lancets lancets Use to check blood sugar 3 times daily. 100 each 6   Blood Glucose Monitoring Suppl (ACCU-CHEK GUIDE) w/Device KIT Use to check blood sugar 3 times daily. 1 kit 0   empagliflozin (JARDIANCE) 25 MG TABS tablet Take 1 tablet (25 mg total) by mouth daily before breakfast. 90 tablet 1   glucose blood (ACCU-CHEK GUIDE TEST) test strip Use to check blood sugar 3 times daily. 100 each 6   insulin glargine (LANTUS SOLOSTAR) 100 UNIT/ML Solostar Pen Inject 20 Units into the skin daily. 15 mL PRN   Insulin Pen Needle (B-D UF III MINI PEN NEEDLES) 31G X 5 MM MISC Use as instructed. Inject into the skin with Lantus. 100 each 3   metFORMIN (GLUCOPHAGE) 1000 MG tablet Take 1 tablet (1,000 mg total) by mouth 2 (two) times daily with a meal. 180 tablet 1   atorvastatin (LIPITOR) 20 MG tablet Take 1 tablet (20 mg total) by mouth daily. 90 tablet 1   glipiZIDE (GLIPIZIDE XL) 10 MG 24 hr tablet Take 1 tablet (10 mg total) by mouth 2 (two) times daily before a meal. Must have office visit for additional refills. 60 tablet 0   lisinopril (ZESTRIL) 2.5 MG tablet Take 1 tablet (2.5 mg total) by mouth daily. 90 tablet 0   Semaglutide, 1 MG/DOSE, (OZEMPIC, 1 MG/DOSE,) 4 MG/3ML SOPN Inject 1 mg as directed once a week. 6 mL 0   No facility-administered medications prior to visit.    No Known Allergies     Objective:    BP 123/74 (BP Location: Left Arm, Patient Position: Sitting, Cuff Size: Large)   Pulse 91   Resp 19   Ht 5\' 4"  (1.626 m)   Wt 280 lb (127 kg)   LMP 07/16/2022 (Approximate)   SpO2 95%   BMI 48.06 kg/m  Wt Readings from Last 3 Encounters:  05/07/23 280 lb (127 kg)  12/06/22 272 lb 12.8 oz (123.7 kg)  08/08/22 290 lb 3.2 oz (131.6 kg)    Physical Exam Vitals and nursing note reviewed.  Constitutional:      Appearance: She is  well-developed.  HENT:     Head: Normocephalic and atraumatic.  Cardiovascular:     Rate and Rhythm: Normal rate and regular rhythm.     Heart sounds: Normal heart sounds. No murmur heard.    No friction rub. No gallop.  Pulmonary:     Effort: Pulmonary effort is normal. No tachypnea or respiratory distress.     Breath sounds: Normal breath sounds. No decreased breath sounds, wheezing, rhonchi or rales.  Chest:     Chest wall: No tenderness.  Abdominal:     General: Bowel sounds are normal.     Palpations: Abdomen is soft.  Musculoskeletal:        General: Normal range of  motion.     Cervical back: Normal range of motion.  Skin:    General: Skin is warm and dry.  Neurological:     Mental Status: She is alert and oriented to person, place, and time.     Coordination: Coordination normal.  Psychiatric:        Behavior: Behavior normal. Behavior is cooperative.        Thought Content: Thought content normal.        Judgment: Judgment normal.          Patient has been counseled extensively about nutrition and exercise as well as the importance of adherence with medications and regular follow-up. The patient was given clear instructions to go to ER or return to medical center if symptoms don't improve, worsen or new problems develop. The patient verbalized understanding.   Follow-up: Return in about 3 months (around 08/04/2023).   Claiborne Rigg, FNP-BC [facility] [place], [place] [phone]   05/07/2023, 12:15 PM

## 2023-05-08 ENCOUNTER — Other Ambulatory Visit: Payer: Self-pay

## 2023-05-09 ENCOUNTER — Other Ambulatory Visit: Payer: Self-pay

## 2023-05-11 ENCOUNTER — Other Ambulatory Visit: Payer: Self-pay

## 2023-05-11 ENCOUNTER — Other Ambulatory Visit: Payer: Self-pay | Admitting: Nurse Practitioner

## 2023-05-11 ENCOUNTER — Ambulatory Visit: Payer: Medicaid Other | Attending: Nurse Practitioner

## 2023-05-11 DIAGNOSIS — Z7985 Long-term (current) use of injectable non-insulin antidiabetic drugs: Secondary | ICD-10-CM | POA: Diagnosis not present

## 2023-05-11 DIAGNOSIS — E119 Type 2 diabetes mellitus without complications: Secondary | ICD-10-CM

## 2023-05-12 LAB — CMP14+EGFR
ALT: 65 [IU]/L — ABNORMAL HIGH (ref 0–32)
AST: 67 [IU]/L — ABNORMAL HIGH (ref 0–40)
Albumin: 4.1 g/dL (ref 3.9–4.9)
Alkaline Phosphatase: 83 [IU]/L (ref 44–121)
BUN/Creatinine Ratio: 15 (ref 9–23)
BUN: 9 mg/dL (ref 6–24)
Bilirubin Total: 0.3 mg/dL (ref 0.0–1.2)
CO2: 21 mmol/L (ref 20–29)
Calcium: 9.3 mg/dL (ref 8.7–10.2)
Chloride: 101 mmol/L (ref 96–106)
Creatinine, Ser: 0.59 mg/dL (ref 0.57–1.00)
Globulin, Total: 2.9 g/dL (ref 1.5–4.5)
Glucose: 117 mg/dL — ABNORMAL HIGH (ref 70–99)
Potassium: 4.3 mmol/L (ref 3.5–5.2)
Sodium: 139 mmol/L (ref 134–144)
Total Protein: 7 g/dL (ref 6.0–8.5)
eGFR: 110 mL/min/{1.73_m2} (ref >59)

## 2023-05-16 ENCOUNTER — Other Ambulatory Visit: Payer: Self-pay

## 2023-05-16 ENCOUNTER — Encounter: Payer: Self-pay | Admitting: Nurse Practitioner

## 2023-05-18 ENCOUNTER — Other Ambulatory Visit: Payer: Self-pay

## 2023-05-21 ENCOUNTER — Other Ambulatory Visit: Payer: Self-pay

## 2023-05-29 ENCOUNTER — Other Ambulatory Visit: Payer: Self-pay

## 2023-05-30 ENCOUNTER — Other Ambulatory Visit: Payer: Self-pay

## 2023-06-04 ENCOUNTER — Other Ambulatory Visit: Payer: Self-pay

## 2023-06-06 ENCOUNTER — Other Ambulatory Visit: Payer: Self-pay

## 2023-06-20 ENCOUNTER — Other Ambulatory Visit (HOSPITAL_COMMUNITY): Payer: Self-pay

## 2023-06-20 ENCOUNTER — Other Ambulatory Visit: Payer: Self-pay

## 2023-07-10 ENCOUNTER — Other Ambulatory Visit: Payer: Self-pay

## 2023-07-18 ENCOUNTER — Other Ambulatory Visit: Payer: Self-pay

## 2023-08-06 ENCOUNTER — Other Ambulatory Visit (HOSPITAL_BASED_OUTPATIENT_CLINIC_OR_DEPARTMENT_OTHER): Payer: Self-pay

## 2023-08-06 ENCOUNTER — Other Ambulatory Visit: Payer: Self-pay

## 2023-08-06 ENCOUNTER — Ambulatory Visit: Payer: Medicaid Other | Attending: Nurse Practitioner | Admitting: Nurse Practitioner

## 2023-08-06 ENCOUNTER — Encounter: Payer: Self-pay | Admitting: Nurse Practitioner

## 2023-08-06 ENCOUNTER — Other Ambulatory Visit: Payer: Self-pay | Admitting: Nurse Practitioner

## 2023-08-06 VITALS — BP 117/74 | HR 83 | Resp 19 | Ht 64.0 in | Wt 276.8 lb

## 2023-08-06 DIAGNOSIS — Z7984 Long term (current) use of oral hypoglycemic drugs: Secondary | ICD-10-CM | POA: Diagnosis not present

## 2023-08-06 DIAGNOSIS — E1165 Type 2 diabetes mellitus with hyperglycemia: Secondary | ICD-10-CM

## 2023-08-06 DIAGNOSIS — Z794 Long term (current) use of insulin: Secondary | ICD-10-CM | POA: Diagnosis not present

## 2023-08-06 DIAGNOSIS — Z7985 Long-term (current) use of injectable non-insulin antidiabetic drugs: Secondary | ICD-10-CM | POA: Diagnosis not present

## 2023-08-06 DIAGNOSIS — K089 Disorder of teeth and supporting structures, unspecified: Secondary | ICD-10-CM

## 2023-08-06 DIAGNOSIS — E119 Type 2 diabetes mellitus without complications: Secondary | ICD-10-CM

## 2023-08-06 LAB — POCT GLYCOSYLATED HEMOGLOBIN (HGB A1C): Hemoglobin A1C: 7.2 % — AB (ref 4.0–5.6)

## 2023-08-06 MED ORDER — EMPAGLIFLOZIN 25 MG PO TABS
25.0000 mg | ORAL_TABLET | Freq: Every day | ORAL | 1 refills | Status: DC
  Filled 2023-08-06: qty 90, 90d supply, fill #0
  Filled 2023-11-05: qty 90, 90d supply, fill #1

## 2023-08-06 MED ORDER — GLIPIZIDE ER 10 MG PO TB24
10.0000 mg | ORAL_TABLET | Freq: Two times a day (BID) | ORAL | 1 refills | Status: DC
  Filled 2023-08-06: qty 180, 90d supply, fill #0
  Filled 2023-11-05: qty 180, 90d supply, fill #1

## 2023-08-06 MED ORDER — METFORMIN HCL 1000 MG PO TABS
1000.0000 mg | ORAL_TABLET | Freq: Two times a day (BID) | ORAL | 1 refills | Status: DC
  Filled 2023-08-06 – 2023-09-14 (×2): qty 180, 90d supply, fill #0
  Filled 2023-12-13: qty 180, 90d supply, fill #1

## 2023-08-06 NOTE — Progress Notes (Signed)
 Assessment & Plan:  Norma Chambers was seen today for medical management of chronic issues.  Diagnoses and all orders for this visit:  Diabetes mellitus treated with insulin  and oral medication (HCC) -     POCT glycosylated hemoglobin (Hb A1C) -     glipiZIDE  (GLIPIZIDE  XL) 10 MG 24 hr tablet; Take 1 tablet (10 mg total) by mouth 2 (two) times daily before a meal. -     metFORMIN  (GLUCOPHAGE ) 1000 MG tablet; Take 1 tablet (1,000 mg total) by mouth 2 (two) times daily with a meal. -     CMP14+EGFR Continue blood sugar control as discussed in office today, low carbohydrate diet, and regular physical exercise as tolerated, 150 minutes per week (30 min each day, 5 days per week, or 50 min 3 days per week). Keep blood sugar logs with fasting goal of 90-130 mg/dl, post prandial (after you eat) less than 180.  For Hypoglycemia: BS <60 and Hyperglycemia BS >400; contact the clinic ASAP. Annual eye exams and foot exams are recommended.   Diabetes mellitus treated with injections of non-insulin  medication (HCC) -     CMP14+EGFR Continue blood sugar control as discussed in office today, low carbohydrate diet, and regular physical exercise as tolerated, 150 minutes per week (30 min each day, 5 days per week, or 50 min 3 days per week). Keep blood sugar logs with fasting goal of 90-130 mg/dl, post prandial (after you eat) less than 180.  For Hypoglycemia: BS <60 and Hyperglycemia BS >400; contact the clinic ASAP. Annual eye exams and foot exams are recommended.   Poor dentition -     Ambulatory referral to Dentistry    Patient has been counseled on age-appropriate routine health concerns for screening and prevention. These are reviewed and up-to-date. Referrals have been placed accordingly. Immunizations are up-to-date or declined.    Subjective:   Chief Complaint  Patient presents with   Medical Management of Chronic Issues    Norma Chambers 53 y.o. female presents to office today for follow up to  DM  She has a past medical history of Gestational diabetes, Obesity, and DM 2  DM 2 Diabetes is controlled and A1c near goal of less than 7.  She endorses adherence taking Jardiance , glipizide , metformin  and administering Lantus  and Ozempic .  She is on the max dose of Ozempic  at this time.  Weight is down almost 5 pounds since last visit Lab Results  Component Value Date   HGBA1C 7.2 (A) 08/06/2023    Lab Results  Component Value Date   HGBA1C 8.2 (A) 05/07/2023  LDL at goal with atorvastatin  20 mg daily Lab Results  Component Value Date   LDLCALC 58 01/29/2023     Blood pressure is well controlled.   BP Readings from Last 3 Encounters:  08/06/23 117/74  05/07/23 123/74  02/08/23 124/79        Review of Systems  Constitutional:  Negative for fever, malaise/fatigue and weight loss.  HENT: Negative.  Negative for nosebleeds.   Eyes: Negative.  Negative for blurred vision, double vision and photophobia.  Respiratory: Negative.  Negative for cough and shortness of breath.   Cardiovascular: Negative.  Negative for chest pain, palpitations and leg swelling.  Gastrointestinal: Negative.  Negative for heartburn, nausea and vomiting.  Musculoskeletal: Negative.  Negative for myalgias.  Neurological: Negative.  Negative for dizziness, focal weakness, seizures and headaches.  Psychiatric/Behavioral: Negative.  Negative for suicidal ideas.     Past Medical History:  Diagnosis Date  Gestational diabetes    Obesity    Type 2 diabetes mellitus with hyperglycemia, without long-term current use of insulin  [facility])     Past Surgical History:  Procedure Laterality Date   CESAREAN SECTION     CESAREAN SECTION N/A 11/18/2012   Procedure: REPEAT CESAREAN SECTION;  Surgeon: Verlyn Goad, MD;  Location: [facility];  Service: Obstetrics;  Laterality: N/A;    Family History  Problem Relation Age of Onset   Diabetes Mother    Diabetes Maternal Grandmother    Bell's palsy Neg Hx      Social History Reviewed with no changes to be made today.   Outpatient Medications Prior to Visit  Medication Sig Dispense Refill   Accu-Chek Softclix Lancets lancets Use to check blood sugar 3 times daily. 100 each 6   atorvastatin  (LIPITOR) 20 MG tablet Take 1 tablet (20 mg total) by mouth daily. 90 tablet 1   Blood Glucose Monitoring Suppl (ACCU-CHEK GUIDE) w/Device KIT Use to check blood sugar 3 times daily. 1 kit 0   empagliflozin  (JARDIANCE ) 25 MG TABS tablet Take 1 tablet (25 mg total) by mouth daily before breakfast. 90 tablet 1   glucose blood (ACCU-CHEK GUIDE TEST) test strip Use to check blood sugar 3 times daily. 100 each 6   insulin  glargine (LANTUS  SOLOSTAR) 100 UNIT/ML Solostar Pen Inject 20 Units into the skin daily. 15 mL PRN   Insulin  Pen Needle (B-D UF III MINI PEN NEEDLES) 31G X 5 MM MISC Use as instructed. Inject into the skin with Lantus . 100 each 3   lisinopril  (ZESTRIL ) 2.5 MG tablet Take 1 tablet (2.5 mg total) by mouth daily. 90 tablet 1   Semaglutide , 2 MG/DOSE, 8 MG/3ML SOPN Inject 2 mg as directed once a week. 3 mL 6   glipiZIDE  (GLIPIZIDE  XL) 10 MG 24 hr tablet Take 1 tablet (10 mg total) by mouth 2 (two) times daily before a meal. Must have office visit for additional refills. 180 tablet 1   metFORMIN  (GLUCOPHAGE ) 1000 MG tablet Take 1 tablet (1,000 mg total) by mouth 2 (two) times daily with a meal. 180 tablet 1   No facility-administered medications prior to visit.    No Known Allergies     Objective:    BP 117/74 (BP Location: Left Arm, Patient Position: Sitting, Cuff Size: Large)   Pulse 83   Resp 19   Ht 5\' 4"  (1.626 m)   Wt 276 lb 12.8 oz (125.6 kg)   LMP 07/16/2022 (Approximate)   SpO2 100%   BMI 47.51 kg/m  Wt Readings from Last 3 Encounters:  08/06/23 276 lb 12.8 oz (125.6 kg)  05/07/23 280 lb (127 kg)  12/06/22 272 lb 12.8 oz (123.7 kg)    Physical Exam Vitals and nursing note reviewed.  Constitutional:      Appearance: She is  well-developed.  HENT:     Head: Normocephalic and atraumatic.  Cardiovascular:     Rate and Rhythm: Normal rate and regular rhythm.     Heart sounds: Normal heart sounds. No murmur heard.    No friction rub. No gallop.  Pulmonary:     Effort: Pulmonary effort is normal. No tachypnea or respiratory distress.     Breath sounds: Normal breath sounds. No decreased breath sounds, wheezing, rhonchi or rales.  Chest:     Chest wall: No tenderness.  Abdominal:     General: Bowel sounds are normal.     Palpations: Abdomen is soft.  Musculoskeletal:  General: Normal range of motion.     Cervical back: Normal range of motion.  Skin:    General: Skin is warm and dry.  Neurological:     Mental Status: She is alert and oriented to person, place, and time.     Coordination: Coordination normal.  Psychiatric:        Behavior: Behavior normal. Behavior is cooperative.        Thought Content: Thought content normal.        Judgment: Judgment normal.          Patient has been counseled extensively about nutrition and exercise as well as the importance of adherence with medications and regular follow-up. The patient was given clear instructions to go to ER or return to medical center if symptoms don't improve, worsen or new problems develop. The patient verbalized understanding.   Follow-up: Return in about 3 months (around 11/06/2023).   Collins Dean, FNP-BC [facility] [place], [place] [phone]   08/06/2023, 10:43 AM

## 2023-08-07 ENCOUNTER — Encounter: Payer: Self-pay | Admitting: Nurse Practitioner

## 2023-08-07 LAB — CMP14+EGFR
ALT: 57 IU/L — ABNORMAL HIGH (ref 0–32)
AST: 62 IU/L — ABNORMAL HIGH (ref 0–40)
Albumin: 4.2 g/dL (ref 3.9–4.9)
Alkaline Phosphatase: 83 IU/L (ref 44–121)
BUN/Creatinine Ratio: 16 (ref 9–23)
BUN: 8 mg/dL (ref 6–24)
Bilirubin Total: 0.3 mg/dL (ref 0.0–1.2)
CO2: 21 mmol/L (ref 20–29)
Calcium: 8.8 mg/dL (ref 8.7–10.2)
Chloride: 99 mmol/L (ref 96–106)
Creatinine, Ser: 0.51 mg/dL — ABNORMAL LOW (ref 0.57–1.00)
Globulin, Total: 2.6 g/dL (ref 1.5–4.5)
Glucose: 98 mg/dL (ref 70–99)
Potassium: 4.4 mmol/L (ref 3.5–5.2)
Sodium: 134 mmol/L (ref 134–144)
Total Protein: 6.8 g/dL (ref 6.0–8.5)
eGFR: 114 mL/min/{1.73_m2} (ref >59)

## 2023-08-08 ENCOUNTER — Other Ambulatory Visit: Payer: Self-pay

## 2023-08-15 ENCOUNTER — Other Ambulatory Visit: Payer: Self-pay

## 2023-09-01 ENCOUNTER — Other Ambulatory Visit: Payer: Self-pay | Admitting: Nurse Practitioner

## 2023-09-01 DIAGNOSIS — E1165 Type 2 diabetes mellitus with hyperglycemia: Secondary | ICD-10-CM

## 2023-09-03 ENCOUNTER — Other Ambulatory Visit: Payer: Self-pay

## 2023-09-03 MED ORDER — LANTUS SOLOSTAR 100 UNIT/ML ~~LOC~~ SOPN
20.0000 [IU] | PEN_INJECTOR | Freq: Every day | SUBCUTANEOUS | 99 refills | Status: DC
  Filled 2023-09-03: qty 15, 75d supply, fill #0
  Filled 2023-12-04: qty 15, 75d supply, fill #1
  Filled 2024-03-09: qty 15, 75d supply, fill #2

## 2023-09-04 ENCOUNTER — Other Ambulatory Visit: Payer: Self-pay

## 2023-09-05 ENCOUNTER — Other Ambulatory Visit: Payer: Self-pay

## 2023-09-15 ENCOUNTER — Other Ambulatory Visit: Payer: Self-pay

## 2023-09-15 ENCOUNTER — Other Ambulatory Visit (HOSPITAL_BASED_OUTPATIENT_CLINIC_OR_DEPARTMENT_OTHER): Payer: Self-pay

## 2023-09-17 ENCOUNTER — Other Ambulatory Visit: Payer: Self-pay

## 2023-10-17 ENCOUNTER — Ambulatory Visit: Attending: Nurse Practitioner

## 2023-10-17 ENCOUNTER — Other Ambulatory Visit: Payer: Self-pay

## 2023-10-17 DIAGNOSIS — Z23 Encounter for immunization: Secondary | ICD-10-CM

## 2023-10-17 NOTE — Progress Notes (Unsigned)
 TDAP vaccine administered in left deltoid per protocols.  Information sheet given. Patient denies and pain or discomfort at injection site. Tolerated injection well no reaction.

## 2023-10-25 ENCOUNTER — Other Ambulatory Visit: Payer: Self-pay

## 2023-11-05 ENCOUNTER — Telehealth: Payer: Self-pay | Admitting: Nurse Practitioner

## 2023-11-05 LAB — HM DIABETES EYE EXAM

## 2023-11-05 NOTE — Telephone Encounter (Signed)
 Called patient, no answer. Left voicemail confirming upcoming appointment on 11/06/2023 at 10:30 pm. Provided callback number for any questions or changes.

## 2023-11-06 ENCOUNTER — Other Ambulatory Visit: Payer: Self-pay

## 2023-11-06 ENCOUNTER — Ambulatory Visit: Attending: Nurse Practitioner | Admitting: Nurse Practitioner

## 2023-11-06 ENCOUNTER — Encounter: Payer: Self-pay | Admitting: Nurse Practitioner

## 2023-11-06 DIAGNOSIS — E1169 Type 2 diabetes mellitus with other specified complication: Secondary | ICD-10-CM

## 2023-11-06 DIAGNOSIS — E119 Type 2 diabetes mellitus without complications: Secondary | ICD-10-CM

## 2023-11-06 DIAGNOSIS — E785 Hyperlipidemia, unspecified: Secondary | ICD-10-CM

## 2023-11-06 DIAGNOSIS — Z794 Long term (current) use of insulin: Secondary | ICD-10-CM | POA: Diagnosis not present

## 2023-11-06 DIAGNOSIS — Z7984 Long term (current) use of oral hypoglycemic drugs: Secondary | ICD-10-CM | POA: Diagnosis not present

## 2023-11-06 DIAGNOSIS — B372 Candidiasis of skin and nail: Secondary | ICD-10-CM

## 2023-11-06 DIAGNOSIS — E1165 Type 2 diabetes mellitus with hyperglycemia: Secondary | ICD-10-CM

## 2023-11-06 LAB — POCT GLYCOSYLATED HEMOGLOBIN (HGB A1C): Hemoglobin A1C: 7.4 % — AB (ref 4.0–5.6)

## 2023-11-06 MED ORDER — EMPAGLIFLOZIN 25 MG PO TABS
25.0000 mg | ORAL_TABLET | Freq: Every day | ORAL | 1 refills | Status: AC
  Filled 2024-02-01: qty 90, 90d supply, fill #0
  Filled 2024-04-28 – 2024-04-29 (×2): qty 90, 90d supply, fill #1

## 2023-11-06 MED ORDER — NYSTATIN 100000 UNIT/GM EX CREA
1.0000 | TOPICAL_CREAM | Freq: Two times a day (BID) | CUTANEOUS | 0 refills | Status: AC
  Filled 2023-11-06: qty 60, 30d supply, fill #0

## 2023-11-06 MED ORDER — LISINOPRIL 2.5 MG PO TABS
2.5000 mg | ORAL_TABLET | Freq: Every day | ORAL | 1 refills | Status: DC
  Filled 2023-11-06 – 2023-12-04 (×2): qty 90, 90d supply, fill #0
  Filled 2024-03-09: qty 90, 90d supply, fill #1

## 2023-11-06 MED ORDER — GLIPIZIDE ER 10 MG PO TB24
10.0000 mg | ORAL_TABLET | Freq: Two times a day (BID) | ORAL | 1 refills | Status: AC
  Filled 2024-02-01: qty 180, 90d supply, fill #0
  Filled 2024-05-07: qty 180, 90d supply, fill #1

## 2023-11-06 MED ORDER — ATORVASTATIN CALCIUM 20 MG PO TABS
20.0000 mg | ORAL_TABLET | Freq: Every day | ORAL | 1 refills | Status: AC
  Filled 2023-11-06 – 2023-12-13 (×2): qty 90, 90d supply, fill #0
  Filled 2024-03-22: qty 90, 90d supply, fill #1

## 2023-11-06 NOTE — Progress Notes (Unsigned)
 Possible heat rash

## 2023-11-06 NOTE — Progress Notes (Unsigned)
 Assessment & Plan:  Da was seen today for diabetes.  Diagnoses and all orders for this visit:  Diabetes mellitus treated with insulin  and oral medication (HCC) -     POCT glycosylated hemoglobin (Hb A1C) -     lisinopril  (ZESTRIL ) 2.5 MG tablet; Take 1 tablet (2.5 mg total) by mouth daily. -     glipiZIDE  (GLIPIZIDE  XL) 10 MG 24 hr tablet; Take 1 tablet (10 mg total) by mouth 2 (two) times daily before a meal. -     empagliflozin  (JARDIANCE ) 25 MG TABS tablet; Take 1 tablet (25 mg total) by mouth daily before breakfast. A1c increased to 7.4%. Home glucose 100-126 mg/dL. - Encouraged walking 30-45 minutes, three times weekly. - Refilled glipizide  and Jardiance  for six months.  Hyperlipidemia associated with type 2 diabetes mellitus (HCC) -     atorvastatin  (LIPITOR) 20 MG tablet; Take 1 tablet (20 mg total) by mouth daily. INSTRUCTIONS: Work on a low fat, heart healthy diet and participate in regular aerobic exercise program by working out at least 150 minutes per week; 5 days a week-30 minutes per day. Avoid red meat/beef/steak,  fried foods. junk foods, sodas, sugary drinks, unhealthy snacking, alcohol and smoking.  Drink at least 80 oz of water per day and monitor your carbohydrate intake daily.    Candidiasis, intertrigo -     nystatin  cream (MYCOSTATIN ); Apply 1 Application topically 2 (two) times daily to rash area   Patient has been counseled on age-appropriate routine health concerns for screening and prevention. These are reviewed and up-to-date. Referrals have been placed accordingly. Immunizations are up-to-date or declined.    Subjective:   Chief Complaint  Patient presents with   Diabetes    History of Present Illness Norma Chambers is a 53 year old female with diabetes who presents for follow-up of her DM  DM 2 Her home blood sugar readings have been around 126 mg/dL, but her J8r today has increased slightly from 7.2% to 7.4%. She engages in physical activity by  walking around the house for about 10 to 15 minutes, three days a week. She has experienced a weight loss of five pounds recently. Her current weight is 271lbs with a goal weight of 250 pounds as a starting goal, with an ideal weight in the 150s for her height of 5'4. Lab Results  Component Value Date   HGBA1C 7.4 (A) 11/06/2023     She experiences a erythematous pruritic recurrent heat rash every summer around her neck and torso.WIll need to monitor for recurrence with her taking jardiance .    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   Review of Systems  Constitutional:  Negative for fever, malaise/fatigue and weight loss.  HENT: Negative.  Negative for nosebleeds.   Eyes: Negative.  Negative for blurred vision, double vision and photophobia.  Respiratory: Negative.  Negative for cough and shortness of breath.   Cardiovascular: Negative.  Negative for chest pain, palpitations and leg swelling.  Gastrointestinal: Negative.  Negative for heartburn, nausea and vomiting.  Musculoskeletal: Negative.  Negative for myalgias.  Skin:  Positive for itching and rash.  Neurological: Negative.  Negative for dizziness, focal weakness, seizures and headaches.  Psychiatric/Behavioral: Negative.  Negative for suicidal ideas.     Past Medical History:  Diagnosis Date   Gestational diabetes    Obesity    Type 2 diabetes mellitus with hyperglycemia, without long-term current use of insulin  (HCC)  Past Surgical History:  Procedure Laterality Date   CESAREAN SECTION     CESAREAN SECTION N/A 11/18/2012   Procedure: REPEAT CESAREAN SECTION;  Surgeon: Winton Felt, MD;  Location: [facility];  Service: Obstetrics;  Laterality: N/A;    Family History  Problem Relation Age of Onset   Diabetes Mother    Diabetes Maternal Grandmother    Bell's palsy Neg Hx     Social History Reviewed with no changes to be made today.   Outpatient Medications  Prior to Visit  Medication Sig Dispense Refill   Accu-Chek Softclix Lancets lancets Use to check blood sugar 3 times daily. 100 each 6   Blood Glucose Monitoring Suppl (ACCU-CHEK GUIDE) w/Device KIT Use to check blood sugar 3 times daily. 1 kit 0   glucose blood (ACCU-CHEK GUIDE TEST) test strip Use to check blood sugar 3 times daily. 100 each 6   insulin  glargine (LANTUS  SOLOSTAR) 100 UNIT/ML Solostar Pen Inject 20 Units into the skin daily. 15 mL PRN   Insulin  Pen Needle (B-D UF III MINI PEN NEEDLES) 31G X 5 MM MISC Use as instructed. Inject into the skin with Lantus . 100 each 3   metFORMIN  (GLUCOPHAGE ) 1000 MG tablet Take 1 tablet (1,000 mg total) by mouth 2 (two) times daily with a meal. 180 tablet 1   Semaglutide , 2 MG/DOSE, 8 MG/3ML SOPN Inject 2 mg as directed once a week. 3 mL 6   atorvastatin  (LIPITOR) 20 MG tablet Take 1 tablet (20 mg total) by mouth daily. 90 tablet 1   empagliflozin  (JARDIANCE ) 25 MG TABS tablet Take 1 tablet (25 mg total) by mouth daily before breakfast. 90 tablet 1   glipiZIDE  (GLIPIZIDE  XL) 10 MG 24 hr tablet Take 1 tablet (10 mg total) by mouth 2 (two) times daily before a meal. 180 tablet 1   lisinopril  (ZESTRIL ) 2.5 MG tablet Take 1 tablet (2.5 mg total) by mouth daily. 90 tablet 1   No facility-administered medications prior to visit.    No Known Allergies     Objective:    BP 120/79 (BP Location: Left Arm, Patient Position: Sitting, Cuff Size: Normal)   Pulse 87   Resp 19   Ht 5' 4 (1.626 m)   Wt 271 lb 9.6 oz (123.2 kg)   LMP 07/16/2022 (Approximate)   SpO2 96%   BMI 46.62 kg/m  Wt Readings from Last 3 Encounters:  11/06/23 271 lb 9.6 oz (123.2 kg)  08/06/23 276 lb 12.8 oz (125.6 kg)  05/07/23 280 lb (127 kg)    Physical Exam Vitals and nursing note reviewed.  Constitutional:      Appearance: She is well-developed. She is obese.  HENT:     Head: Normocephalic and atraumatic.  Cardiovascular:     Rate and Rhythm: Normal rate and  regular rhythm.     Heart sounds: Normal heart sounds. No murmur heard.    No friction rub. No gallop.  Pulmonary:     Effort: Pulmonary effort is normal. No tachypnea or respiratory distress.     Breath sounds: Normal breath sounds. No decreased breath sounds, wheezing, rhonchi or rales.  Chest:     Chest wall: No tenderness.  Abdominal:     General: Bowel sounds are normal.     Palpations: Abdomen is soft.  Musculoskeletal:        General: Normal range of motion.     Cervical back: Normal range of motion.  Skin:    General: Skin is warm and dry.  Neurological:     Mental Status: She is alert and oriented to person, place, and time.     Coordination: Coordination normal.  Psychiatric:        Behavior: Behavior normal. Behavior is cooperative.        Thought Content: Thought content normal.        Judgment: Judgment normal.          Patient has been counseled extensively about nutrition and exercise as well as the importance of adherence with medications and regular follow-up. The patient was given clear instructions to go to ER or return to medical center if symptoms don't improve, worsen or new problems develop. The patient verbalized understanding.   Follow-up: Return in about 14 weeks (around 02/12/2024).   Haze LELON Servant, FNP-BC [facility] [place], [place] [phone]   11/08/2023, 9:05 PM

## 2023-11-08 ENCOUNTER — Other Ambulatory Visit: Payer: Self-pay

## 2023-11-08 ENCOUNTER — Encounter: Payer: Self-pay | Admitting: Nurse Practitioner

## 2023-11-09 NOTE — Telephone Encounter (Signed)
 She can drop the form off and needs to fill out all of her personal information on the form

## 2023-12-05 ENCOUNTER — Other Ambulatory Visit: Payer: Self-pay

## 2023-12-07 ENCOUNTER — Other Ambulatory Visit: Payer: Self-pay

## 2023-12-14 ENCOUNTER — Other Ambulatory Visit: Payer: Self-pay

## 2023-12-17 ENCOUNTER — Other Ambulatory Visit: Payer: Self-pay

## 2024-02-01 ENCOUNTER — Other Ambulatory Visit: Payer: Self-pay

## 2024-02-04 ENCOUNTER — Other Ambulatory Visit: Payer: Self-pay

## 2024-02-12 ENCOUNTER — Ambulatory Visit: Admitting: Nurse Practitioner

## 2024-02-19 ENCOUNTER — Ambulatory Visit: Admitting: Nurse Practitioner

## 2024-03-09 ENCOUNTER — Other Ambulatory Visit: Payer: Self-pay | Admitting: Nurse Practitioner

## 2024-03-09 DIAGNOSIS — E119 Type 2 diabetes mellitus without complications: Secondary | ICD-10-CM

## 2024-03-10 ENCOUNTER — Other Ambulatory Visit: Payer: Self-pay

## 2024-03-10 ENCOUNTER — Other Ambulatory Visit: Payer: Self-pay | Admitting: Nurse Practitioner

## 2024-03-10 DIAGNOSIS — E119 Type 2 diabetes mellitus without complications: Secondary | ICD-10-CM

## 2024-03-10 MED ORDER — OZEMPIC (2 MG/DOSE) 8 MG/3ML ~~LOC~~ SOPN
2.0000 mg | PEN_INJECTOR | SUBCUTANEOUS | 6 refills | Status: AC
  Filled 2024-03-10: qty 3, 28d supply, fill #0
  Filled 2024-04-20: qty 3, 28d supply, fill #1

## 2024-03-10 MED ORDER — METFORMIN HCL 1000 MG PO TABS
1000.0000 mg | ORAL_TABLET | Freq: Two times a day (BID) | ORAL | 1 refills | Status: AC
  Filled 2024-03-10: qty 180, 90d supply, fill #0

## 2024-03-11 ENCOUNTER — Other Ambulatory Visit: Payer: Self-pay

## 2024-03-17 ENCOUNTER — Ambulatory Visit: Attending: Nurse Practitioner | Admitting: Nurse Practitioner

## 2024-03-17 ENCOUNTER — Other Ambulatory Visit: Payer: Self-pay

## 2024-03-17 ENCOUNTER — Encounter: Payer: Self-pay | Admitting: Nurse Practitioner

## 2024-03-17 VITALS — BP 127/82 | HR 83 | Ht 64.0 in | Wt 271.7 lb

## 2024-03-17 DIAGNOSIS — M79604 Pain in right leg: Secondary | ICD-10-CM

## 2024-03-17 DIAGNOSIS — M79605 Pain in left leg: Secondary | ICD-10-CM | POA: Diagnosis not present

## 2024-03-17 DIAGNOSIS — E119 Type 2 diabetes mellitus without complications: Secondary | ICD-10-CM | POA: Diagnosis not present

## 2024-03-17 DIAGNOSIS — Z7984 Long term (current) use of oral hypoglycemic drugs: Secondary | ICD-10-CM | POA: Diagnosis not present

## 2024-03-17 DIAGNOSIS — Z794 Long term (current) use of insulin: Secondary | ICD-10-CM | POA: Diagnosis not present

## 2024-03-17 DIAGNOSIS — Z6841 Body Mass Index (BMI) 40.0 and over, adult: Secondary | ICD-10-CM

## 2024-03-17 DIAGNOSIS — E7800 Pure hypercholesterolemia, unspecified: Secondary | ICD-10-CM | POA: Diagnosis not present

## 2024-03-17 LAB — POCT GLYCOSYLATED HEMOGLOBIN (HGB A1C): Hemoglobin A1C: 9 % — AB (ref 4.0–5.6)

## 2024-03-17 MED ORDER — LANTUS SOLOSTAR 100 UNIT/ML ~~LOC~~ SOPN
PEN_INJECTOR | SUBCUTANEOUS | 99 refills | Status: AC
  Filled 2024-03-17: qty 15, fill #0
  Filled 2024-03-22: qty 15, 50d supply, fill #0
  Filled 2024-04-20: qty 15, fill #0
  Filled 2024-04-28 – 2024-05-07 (×2): qty 15, 50d supply, fill #0

## 2024-03-17 MED ORDER — LISINOPRIL 2.5 MG PO TABS
2.5000 mg | ORAL_TABLET | Freq: Every day | ORAL | 1 refills | Status: AC
  Filled 2024-03-17: qty 90, 90d supply, fill #0

## 2024-03-17 MED ORDER — ACCU-CHEK GUIDE TEST VI STRP
ORAL_STRIP | 6 refills | Status: AC
  Filled 2024-03-17: qty 100, 33d supply, fill #0
  Filled 2024-04-28 – 2024-04-29 (×2): qty 100, 33d supply, fill #1

## 2024-03-17 NOTE — Progress Notes (Signed)
 Assessment & Plan:  Norma Chambers was seen today for diabetes and leg pain.  Diagnoses and all orders for this visit:  Diabetes mellitus treated with insulin  and oral medication (HCC) -     POCT glycosylated hemoglobin (Hb A1C) -     Urine Albumin/Creatinine with ratio (send out) [LAB689] -     glucose blood (ACCU-CHEK GUIDE TEST) test strip; Use to check blood sugar 3 times daily. -     lisinopril  (ZESTRIL ) 2.5 MG tablet; Take 1 tablet (2.5 mg total) by mouth daily. -     insulin  glargine (LANTUS  SOLOSTAR) 100 UNIT/ML Solostar Pen; Administer 20 units of insulin  in the evening and 10 units in the morning. -     CMP14+EGFR Continue blood sugar control as discussed in office today, low carbohydrate diet, and regular physical exercise as tolerated, 150 minutes per week (30 min each day, 5 days per week, or 50 min 3 days per week). Keep blood sugar logs with fasting goal of 90-130 mg/dl, post prandial (after you eat) less than 180.  For Hypoglycemia: BS <60 and Hyperglycemia BS >400; contact the clinic ASAP. Annual eye exams and foot exams are recommended.   Hypercholesterolemia -     Lipid panel INSTRUCTIONS: Work on a low fat, heart healthy diet and participate in regular aerobic exercise program by working out at least 150 minutes per week; 5 days a week-30 minutes per day. Avoid red meat/beef/steak,  fried foods. junk foods, sodas, sugary drinks, unhealthy snacking, alcohol and smoking.  Drink at least 80 oz of water per day and monitor your carbohydrate intake daily.    Bilateral leg pain Continue tylenol  for pain Recommend compression sock Needs cushioned mat for work.    Morbid obesity with BMI of 45.0-49.9, adult  Continue ozempic  Discussed diet and exercise for person with BMI >46. Instructed: You must burn more calories than you eat. Losing 5 percent of your body weight should be considered a success. In the longer term, losing more than 15 percent of your body weight and staying at  this weight is an extremely good result. However, keep in mind that even losing 5 percent of your body weight leads to important health benefits, so try not to get discouraged if you're not able to lose more than this. Will recheck weight in 3 months.     Patient has been counseled on age-appropriate routine health concerns for screening and prevention. These are reviewed and up-to-date. Referrals have been placed accordingly. Immunizations are up-to-date or declined.    Subjective:   Chief Complaint  Patient presents with   Diabetes   Leg Pain    Pain after stading for over five hours at work.    Norma Chambers 53 y.o. female presents to office today for follow up to diabetes and  bilateral leg pain.   She has a past medical history of Gestational diabetes, Obesity, and DM 2     DM2 She reports adherence taking all medications:Jardiance , glipizide  XL, ozempic  and lantus  20 units nightly. 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 A1c has increased form 7.4 to 9.0 currently  Lab Results  Component Value Date   HGBA1C 9.0 (A) 03/17/2024    Lab Results  Component Value Date   HGBA1C 7.4 (A) 11/06/2023    Blood pressure at goal. She is also taking renal dose ACE BP Readings from Last 3 Encounters:  03/17/24  127/82  11/06/23 120/79  08/06/23 117/74  LDL at goal with statin   Lab Results  Component Value Date   LDLCALC 58 01/29/2023   Weight stable Wt Readings from Last 3 Encounters:  03/17/24 271 lb 11.2 oz (123.2 kg)  11/06/23 271 lb 9.6 oz (123.2 kg)  08/06/23 276 lb 12.8 oz (125.6 kg)     Joint/Muscle Pain: Patient complains of arthralgias and myalgias for which has been present for several weeks. Pain is located in the bilateral upper legs, is described as aching and dull, and is intermittent .  Associated symptoms include: none.  The patient has tried tylenol  for relief which has been  effective. 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  Unrelated to injury   Review of Systems  Constitutional:  Negative for fever, malaise/fatigue and weight loss.  HENT: Negative.  Negative for nosebleeds.   Eyes: Negative.  Negative for blurred vision, double vision and photophobia.  Respiratory: Negative.  Negative for cough and shortness of breath.   Cardiovascular: Negative.  Negative for chest pain, palpitations and leg swelling.  Gastrointestinal: Negative.  Negative for heartburn, nausea and vomiting.  Musculoskeletal:  Positive for joint pain and myalgias.  Neurological: Negative.  Negative for dizziness, focal weakness, seizures and headaches.  Psychiatric/Behavioral: Negative.  Negative for suicidal ideas.     Past Medical History:  Diagnosis Date   Gestational diabetes    Obesity    Type 2 diabetes mellitus with hyperglycemia, without long-term current use of insulin  (HCC)     Past Surgical History:  Procedure Laterality Date   CESAREAN SECTION     CESAREAN SECTION N/A 11/18/2012   Procedure: REPEAT CESAREAN SECTION;  Surgeon: Winton Felt, MD;  Location: [facility];  Service: Obstetrics;  Laterality: N/A;    Family History  Problem Relation Age of Onset   Diabetes Mother    Diabetes Maternal Grandmother    Bell's palsy Neg Hx     Social History Reviewed with no changes to be made today.   Outpatient Medications Prior to Visit  Medication Sig Dispense Refill   Accu-Chek Softclix Lancets lancets Use to check blood sugar 3 times daily. 100 each 6   atorvastatin  (LIPITOR) 20 MG tablet Take 1 tablet (20 mg total) by mouth daily. 90 tablet 1   Blood Glucose Monitoring Suppl (ACCU-CHEK GUIDE) w/Device KIT Use to check blood sugar 3 times daily. 1 kit 0   empagliflozin  (JARDIANCE ) 25 MG TABS tablet Take 1 tablet (25 mg total) by mouth daily before breakfast. 90 tablet 1   glipiZIDE  (GLIPIZIDE  XL) 10 MG 24 hr tablet Take 1  tablet (10 mg total) by mouth 2 (two) times daily before a meal. 180 tablet 1   Insulin  Pen Needle (B-D UF III MINI PEN NEEDLES) 31G X 5 MM MISC Use as instructed. Inject into the skin with Lantus . 100 each 3   metFORMIN  (GLUCOPHAGE ) 1000 MG tablet Take 1 tablet (1,000 mg total) by mouth 2 (two) times daily with a meal. 180 tablet 1   nystatin  cream (MYCOSTATIN ) Apply 1 Application topically 2 (two) times daily to rash areas 60 g 0   Semaglutide , 2 MG/DOSE, (OZEMPIC , 2 MG/DOSE,) 8 MG/3ML SOPN Inject 2 mg as directed once a week. 3 mL 6   glucose blood (ACCU-CHEK GUIDE TEST) test strip Use to check blood sugar 3 times daily. 100 each 6   insulin  glargine (LANTUS  SOLOSTAR) 100 UNIT/ML Solostar Pen Inject 20 Units into the  skin daily. 15 mL PRN   lisinopril  (ZESTRIL ) 2.5 MG tablet Take 1 tablet (2.5 mg total) by mouth daily. 90 tablet 1   No facility-administered medications prior to visit.    Allergies[1]     Objective:    BP 127/82 (BP Location: Left Arm, Patient Position: Sitting, Cuff Size: Normal)   Pulse 83   Ht 5' 4 (1.626 m)   Wt 271 lb 11.2 oz (123.2 kg)   LMP 07/16/2022   SpO2 96%   BMI 46.64 kg/m  Wt Readings from Last 3 Encounters:  03/17/24 271 lb 11.2 oz (123.2 kg)  11/06/23 271 lb 9.6 oz (123.2 kg)  08/06/23 276 lb 12.8 oz (125.6 kg)    Physical Exam Vitals and nursing note reviewed.  Constitutional:      Appearance: She is well-developed. She is obese.  HENT:     Head: Normocephalic and atraumatic.  Cardiovascular:     Rate and Rhythm: Normal rate and regular rhythm.     Heart sounds: Normal heart sounds. No murmur heard.    No friction rub. No gallop.  Pulmonary:     Effort: Pulmonary effort is normal. No tachypnea or respiratory distress.     Breath sounds: Normal breath sounds. No decreased breath sounds, wheezing, rhonchi or rales.  Chest:     Chest wall: No tenderness.  Abdominal:     General: Bowel sounds are normal.     Palpations: Abdomen is soft.   Musculoskeletal:        General: Normal range of motion.     Cervical back: Normal range of motion.  Skin:    General: Skin is warm and dry.  Neurological:     Mental Status: She is alert and oriented to person, place, and time.     Coordination: Coordination normal.  Psychiatric:        Behavior: Behavior normal. Behavior is cooperative.        Thought Content: Thought content normal.        Judgment: Judgment normal.          Patient has been counseled extensively about nutrition and exercise as well as the importance of adherence with medications and regular follow-up. The patient was given clear instructions to go to ER or return to medical center if symptoms don't improve, worsen or new problems develop. The patient verbalized understanding.   Follow-up: Return in about 3 months (around 06/20/2024).   Haze LELON Servant, FNP-BC [facility] and [facility] [place], [place] [phone]   03/17/2024, 11:48 AM    [1] No Known Allergies

## 2024-03-18 LAB — CMP14+EGFR
ALT: 53 IU/L — ABNORMAL HIGH (ref 0–32)
AST: 53 IU/L — ABNORMAL HIGH (ref 0–40)
Albumin: 4.4 g/dL (ref 3.9–4.9)
Alkaline Phosphatase: 87 IU/L (ref 41–116)
BUN/Creatinine Ratio: 18 (ref 9–23)
BUN: 9 mg/dL (ref 6–24)
Bilirubin Total: 0.3 mg/dL (ref 0.0–1.2)
CO2: 24 mmol/L (ref 20–29)
Calcium: 9.5 mg/dL (ref 8.7–10.2)
Chloride: 101 mmol/L (ref 96–106)
Creatinine, Ser: 0.49 mg/dL — ABNORMAL LOW (ref 0.57–1.00)
Globulin, Total: 2.5 g/dL (ref 1.5–4.5)
Glucose: 126 mg/dL — ABNORMAL HIGH (ref 70–99)
Potassium: 4.2 mmol/L (ref 3.5–5.2)
Sodium: 138 mmol/L (ref 134–144)
Total Protein: 6.9 g/dL (ref 6.0–8.5)
eGFR: 115 mL/min/1.73 (ref >59)

## 2024-03-18 LAB — MICROALBUMIN / CREATININE URINE RATIO
Creatinine, Urine: 35.8 mg/dL
Microalb/Creat Ratio: <8 mg/g{creat} (ref 0–29)
Microalbumin, Urine: <3 ug/mL

## 2024-03-18 LAB — LIPID PANEL
Chol/HDL Ratio: 3.3 ratio (ref 0.0–4.4)
Cholesterol, Total: 128 mg/dL (ref 100–199)
HDL: 39 mg/dL — ABNORMAL LOW (ref >39)
LDL Chol Calc (NIH): 67 mg/dL (ref 0–99)
Triglycerides: 120 mg/dL (ref 0–149)
VLDL Cholesterol Cal: 22 mg/dL (ref 5–40)

## 2024-03-22 ENCOUNTER — Ambulatory Visit: Payer: Self-pay | Admitting: Nurse Practitioner

## 2024-03-24 ENCOUNTER — Other Ambulatory Visit: Payer: Self-pay

## 2024-03-25 ENCOUNTER — Other Ambulatory Visit: Payer: Self-pay

## 2024-04-21 ENCOUNTER — Other Ambulatory Visit: Payer: Self-pay

## 2024-04-28 ENCOUNTER — Other Ambulatory Visit: Payer: Self-pay

## 2024-04-29 ENCOUNTER — Other Ambulatory Visit: Payer: Self-pay

## 2024-05-07 ENCOUNTER — Other Ambulatory Visit: Payer: Self-pay

## 2024-06-23 ENCOUNTER — Ambulatory Visit: Payer: Self-pay | Admitting: Nurse Practitioner
